# Patient Record
Sex: Female | Born: 1980
Health system: Southern US, Community
[De-identification: ages and names within clinical notes are randomized; demographics above are authoritative.]

## PROBLEM LIST (undated history)

## (undated) DIAGNOSIS — D649 Anemia, unspecified: Secondary | ICD-10-CM

## (undated) DIAGNOSIS — E119 Type 2 diabetes mellitus without complications: Secondary | ICD-10-CM

## (undated) DIAGNOSIS — I471 Supraventricular tachycardia, unspecified: Secondary | ICD-10-CM

## (undated) DIAGNOSIS — G8929 Other chronic pain: Secondary | ICD-10-CM

## (undated) DIAGNOSIS — R000 Tachycardia, unspecified: Secondary | ICD-10-CM

## (undated) DIAGNOSIS — R0789 Other chest pain: Secondary | ICD-10-CM

## (undated) DIAGNOSIS — E063 Autoimmune thyroiditis: Secondary | ICD-10-CM

## (undated) DIAGNOSIS — F419 Anxiety disorder, unspecified: Secondary | ICD-10-CM

## (undated) DIAGNOSIS — K760 Fatty (change of) liver, not elsewhere classified: Secondary | ICD-10-CM

## (undated) DIAGNOSIS — M1990 Unspecified osteoarthritis, unspecified site: Secondary | ICD-10-CM

## (undated) DIAGNOSIS — I10 Essential (primary) hypertension: Secondary | ICD-10-CM

## (undated) DIAGNOSIS — N760 Acute vaginitis: Secondary | ICD-10-CM

## (undated) DIAGNOSIS — E669 Obesity, unspecified: Secondary | ICD-10-CM

## (undated) DIAGNOSIS — K589 Irritable bowel syndrome without diarrhea: Secondary | ICD-10-CM

## (undated) DIAGNOSIS — R102 Pelvic and perineal pain: Secondary | ICD-10-CM

## (undated) DIAGNOSIS — B9689 Other specified bacterial agents as the cause of diseases classified elsewhere: Secondary | ICD-10-CM

## (undated) DIAGNOSIS — N3010 Interstitial cystitis (chronic) without hematuria: Secondary | ICD-10-CM

## (undated) DIAGNOSIS — G4733 Obstructive sleep apnea (adult) (pediatric): Secondary | ICD-10-CM

## (undated) HISTORY — DX: Other specified bacterial agents as the cause of diseases classified elsewhere: N76.0

## (undated) HISTORY — DX: Tachycardia, unspecified: R00.0

## (undated) HISTORY — DX: Other chronic pain: G89.29

## (undated) HISTORY — DX: Anemia, unspecified: D64.9

## (undated) HISTORY — PX: OTHER SURGICAL HISTORY: SHX169

## (undated) HISTORY — DX: Irritable bowel syndrome, unspecified: K58.9

## (undated) HISTORY — DX: Other chest pain: R07.89

## (undated) HISTORY — DX: Unspecified osteoarthritis, unspecified site: M19.90

## (undated) HISTORY — DX: Autoimmune thyroiditis: E06.3

## (undated) HISTORY — DX: Obesity, unspecified: E66.9

## (undated) HISTORY — DX: Anxiety disorder, unspecified: F41.9

## (undated) HISTORY — DX: Type 2 diabetes mellitus without complications: E11.9

## (undated) HISTORY — DX: Pelvic and perineal pain: R10.2

## (undated) HISTORY — PX: CYSTOSTOMY W/ BLADDER BIOPSY: SHX1431

## (undated) HISTORY — DX: Fatty (change of) liver, not elsewhere classified: K76.0

## (undated) HISTORY — DX: Obstructive sleep apnea (adult) (pediatric): G47.33

## (undated) HISTORY — DX: Acute vaginitis: B96.89

---
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
## 1999-06-11 ENCOUNTER — Emergency Department (HOSPITAL_COMMUNITY): Admission: EM | Admit: 1999-06-11 | Discharge: 1999-06-11 | Payer: Self-pay | Admitting: Emergency Medicine

## 1999-06-24 ENCOUNTER — Emergency Department (HOSPITAL_COMMUNITY): Admission: EM | Admit: 1999-06-24 | Discharge: 1999-06-24 | Payer: Self-pay | Admitting: Emergency Medicine

## 1999-06-25 ENCOUNTER — Emergency Department (HOSPITAL_COMMUNITY): Admission: EM | Admit: 1999-06-25 | Discharge: 1999-06-25 | Payer: Self-pay | Admitting: Emergency Medicine

## 1999-07-17 ENCOUNTER — Inpatient Hospital Stay (HOSPITAL_COMMUNITY): Admission: AD | Admit: 1999-07-17 | Discharge: 1999-07-17 | Payer: Self-pay | Admitting: Obstetrics & Gynecology

## 1999-07-18 ENCOUNTER — Ambulatory Visit (HOSPITAL_COMMUNITY): Admission: RE | Admit: 1999-07-18 | Discharge: 1999-07-18 | Payer: Self-pay | Admitting: *Deleted

## 1999-08-28 ENCOUNTER — Inpatient Hospital Stay (HOSPITAL_COMMUNITY): Admission: AD | Admit: 1999-08-28 | Discharge: 1999-08-28 | Payer: Self-pay | Admitting: Obstetrics & Gynecology

## 1999-09-25 HISTORY — PX: TUBAL LIGATION: SHX77

## 2000-01-07 ENCOUNTER — Inpatient Hospital Stay (HOSPITAL_COMMUNITY): Admission: AD | Admit: 2000-01-07 | Discharge: 2000-01-07 | Payer: Self-pay | Admitting: *Deleted

## 2000-01-20 ENCOUNTER — Inpatient Hospital Stay (HOSPITAL_COMMUNITY): Admission: AD | Admit: 2000-01-20 | Discharge: 2000-01-20 | Payer: Self-pay | Admitting: Gynecology

## 2000-02-04 ENCOUNTER — Inpatient Hospital Stay (HOSPITAL_COMMUNITY): Admission: AD | Admit: 2000-02-04 | Discharge: 2000-02-06 | Payer: Self-pay | Admitting: Gynecology

## 2000-03-11 ENCOUNTER — Other Ambulatory Visit: Admission: RE | Admit: 2000-03-11 | Discharge: 2000-03-11 | Payer: Self-pay | Admitting: Gynecology

## 2000-03-21 ENCOUNTER — Other Ambulatory Visit: Admission: RE | Admit: 2000-03-21 | Discharge: 2000-03-21 | Payer: Self-pay | Admitting: Gynecology

## 2000-03-21 ENCOUNTER — Encounter (INDEPENDENT_AMBULATORY_CARE_PROVIDER_SITE_OTHER): Payer: Self-pay

## 2000-06-14 ENCOUNTER — Encounter: Payer: Self-pay | Admitting: Gynecology

## 2000-06-14 ENCOUNTER — Inpatient Hospital Stay (HOSPITAL_COMMUNITY): Admission: AD | Admit: 2000-06-14 | Discharge: 2000-06-14 | Payer: Self-pay | Admitting: Gynecology

## 2000-09-27 ENCOUNTER — Emergency Department (HOSPITAL_COMMUNITY): Admission: EM | Admit: 2000-09-27 | Discharge: 2000-09-27 | Payer: Self-pay | Admitting: Emergency Medicine

## 2000-11-07 ENCOUNTER — Encounter: Payer: Self-pay | Admitting: Emergency Medicine

## 2000-11-07 ENCOUNTER — Emergency Department (HOSPITAL_COMMUNITY): Admission: EM | Admit: 2000-11-07 | Discharge: 2000-11-07 | Payer: Self-pay | Admitting: Emergency Medicine

## 2001-03-25 ENCOUNTER — Other Ambulatory Visit: Admission: RE | Admit: 2001-03-25 | Discharge: 2001-03-25 | Payer: Self-pay | Admitting: Gynecology

## 2001-04-12 ENCOUNTER — Inpatient Hospital Stay (HOSPITAL_COMMUNITY): Admission: AD | Admit: 2001-04-12 | Discharge: 2001-04-12 | Payer: Self-pay | Admitting: *Deleted

## 2001-06-23 ENCOUNTER — Encounter (INDEPENDENT_AMBULATORY_CARE_PROVIDER_SITE_OTHER): Payer: Self-pay

## 2001-06-23 ENCOUNTER — Encounter (INDEPENDENT_AMBULATORY_CARE_PROVIDER_SITE_OTHER): Payer: Self-pay | Admitting: Specialist

## 2001-06-23 ENCOUNTER — Ambulatory Visit (HOSPITAL_COMMUNITY): Admission: RE | Admit: 2001-06-23 | Discharge: 2001-06-23 | Payer: Self-pay | Admitting: Gynecology

## 2001-08-11 ENCOUNTER — Other Ambulatory Visit: Admission: RE | Admit: 2001-08-11 | Discharge: 2001-08-11 | Payer: Self-pay | Admitting: *Deleted

## 2001-08-27 ENCOUNTER — Encounter: Admission: RE | Admit: 2001-08-27 | Discharge: 2001-10-07 | Payer: Self-pay | Admitting: *Deleted

## 2001-10-13 ENCOUNTER — Encounter (INDEPENDENT_AMBULATORY_CARE_PROVIDER_SITE_OTHER): Payer: Self-pay | Admitting: Specialist

## 2001-10-13 ENCOUNTER — Encounter: Payer: Self-pay | Admitting: Internal Medicine

## 2001-10-13 ENCOUNTER — Ambulatory Visit (HOSPITAL_COMMUNITY): Admission: RE | Admit: 2001-10-13 | Discharge: 2001-10-13 | Payer: Self-pay | Admitting: *Deleted

## 2001-12-26 ENCOUNTER — Other Ambulatory Visit: Admission: RE | Admit: 2001-12-26 | Discharge: 2001-12-26 | Payer: Self-pay | Admitting: *Deleted

## 2002-01-01 ENCOUNTER — Encounter: Payer: Self-pay | Admitting: *Deleted

## 2002-01-01 ENCOUNTER — Encounter: Admission: RE | Admit: 2002-01-01 | Discharge: 2002-01-01 | Payer: Self-pay | Admitting: *Deleted

## 2002-04-02 ENCOUNTER — Inpatient Hospital Stay (HOSPITAL_COMMUNITY): Admission: AD | Admit: 2002-04-02 | Discharge: 2002-04-02 | Payer: Self-pay | Admitting: Gynecology

## 2002-07-05 ENCOUNTER — Inpatient Hospital Stay (HOSPITAL_COMMUNITY): Admission: AD | Admit: 2002-07-05 | Discharge: 2002-07-05 | Payer: Self-pay | Admitting: Gynecology

## 2002-09-24 HISTORY — PX: TOTAL ABDOMINAL HYSTERECTOMY: SHX209

## 2002-10-14 ENCOUNTER — Other Ambulatory Visit: Admission: RE | Admit: 2002-10-14 | Discharge: 2002-10-14 | Payer: Self-pay | Admitting: Gynecology

## 2002-11-15 ENCOUNTER — Inpatient Hospital Stay (HOSPITAL_COMMUNITY): Admission: AD | Admit: 2002-11-15 | Discharge: 2002-11-15 | Payer: Self-pay | Admitting: Obstetrics

## 2002-11-15 ENCOUNTER — Encounter: Payer: Self-pay | Admitting: Obstetrics

## 2002-12-24 ENCOUNTER — Inpatient Hospital Stay (HOSPITAL_COMMUNITY): Admission: AD | Admit: 2002-12-24 | Discharge: 2002-12-24 | Payer: Self-pay | Admitting: Obstetrics & Gynecology

## 2003-01-08 ENCOUNTER — Inpatient Hospital Stay (HOSPITAL_COMMUNITY): Admission: AD | Admit: 2003-01-08 | Discharge: 2003-01-08 | Payer: Self-pay | Admitting: Obstetrics & Gynecology

## 2003-01-08 ENCOUNTER — Encounter: Payer: Self-pay | Admitting: Obstetrics & Gynecology

## 2003-01-08 ENCOUNTER — Ambulatory Visit (HOSPITAL_COMMUNITY): Admission: RE | Admit: 2003-01-08 | Discharge: 2003-01-08 | Payer: Self-pay | Admitting: Obstetrics & Gynecology

## 2003-01-31 ENCOUNTER — Inpatient Hospital Stay (HOSPITAL_COMMUNITY): Admission: AD | Admit: 2003-01-31 | Discharge: 2003-01-31 | Payer: Self-pay | Admitting: Obstetrics & Gynecology

## 2003-02-05 ENCOUNTER — Ambulatory Visit (HOSPITAL_COMMUNITY): Admission: RE | Admit: 2003-02-05 | Discharge: 2003-02-05 | Payer: Self-pay | Admitting: Obstetrics & Gynecology

## 2003-02-05 ENCOUNTER — Encounter: Payer: Self-pay | Admitting: Obstetrics & Gynecology

## 2003-02-24 ENCOUNTER — Inpatient Hospital Stay (HOSPITAL_COMMUNITY): Admission: AD | Admit: 2003-02-24 | Discharge: 2003-02-24 | Payer: Self-pay | Admitting: Obstetrics

## 2003-03-09 ENCOUNTER — Inpatient Hospital Stay (HOSPITAL_COMMUNITY): Admission: AD | Admit: 2003-03-09 | Discharge: 2003-03-09 | Payer: Self-pay | Admitting: Obstetrics & Gynecology

## 2003-04-10 ENCOUNTER — Inpatient Hospital Stay (HOSPITAL_COMMUNITY): Admission: AD | Admit: 2003-04-10 | Discharge: 2003-04-10 | Payer: Self-pay | Admitting: Obstetrics & Gynecology

## 2003-04-27 ENCOUNTER — Encounter: Payer: Self-pay | Admitting: Obstetrics & Gynecology

## 2003-04-27 ENCOUNTER — Ambulatory Visit (HOSPITAL_COMMUNITY): Admission: RE | Admit: 2003-04-27 | Discharge: 2003-04-27 | Payer: Self-pay | Admitting: Obstetrics & Gynecology

## 2003-06-01 ENCOUNTER — Encounter: Payer: Self-pay | Admitting: Obstetrics & Gynecology

## 2003-06-01 ENCOUNTER — Inpatient Hospital Stay (HOSPITAL_COMMUNITY): Admission: AD | Admit: 2003-06-01 | Discharge: 2003-06-01 | Payer: Self-pay | Admitting: Obstetrics & Gynecology

## 2003-06-04 ENCOUNTER — Inpatient Hospital Stay (HOSPITAL_COMMUNITY): Admission: AD | Admit: 2003-06-04 | Discharge: 2003-06-04 | Payer: Self-pay | Admitting: Obstetrics & Gynecology

## 2003-06-08 ENCOUNTER — Ambulatory Visit (HOSPITAL_COMMUNITY): Admission: RE | Admit: 2003-06-08 | Discharge: 2003-06-08 | Payer: Self-pay | Admitting: Obstetrics & Gynecology

## 2003-06-08 ENCOUNTER — Encounter: Payer: Self-pay | Admitting: Obstetrics & Gynecology

## 2003-06-14 ENCOUNTER — Inpatient Hospital Stay (HOSPITAL_COMMUNITY): Admission: AD | Admit: 2003-06-14 | Discharge: 2003-06-14 | Payer: Self-pay | Admitting: Obstetrics

## 2003-06-17 ENCOUNTER — Inpatient Hospital Stay (HOSPITAL_COMMUNITY): Admission: AD | Admit: 2003-06-17 | Discharge: 2003-06-17 | Payer: Self-pay | Admitting: Obstetrics

## 2003-06-17 ENCOUNTER — Encounter: Payer: Self-pay | Admitting: Obstetrics

## 2003-06-18 ENCOUNTER — Ambulatory Visit (HOSPITAL_COMMUNITY): Admission: RE | Admit: 2003-06-18 | Discharge: 2003-06-18 | Payer: Self-pay | Admitting: Obstetrics

## 2003-06-18 ENCOUNTER — Encounter: Payer: Self-pay | Admitting: Obstetrics

## 2003-06-25 ENCOUNTER — Inpatient Hospital Stay (HOSPITAL_COMMUNITY): Admission: AD | Admit: 2003-06-25 | Discharge: 2003-06-28 | Payer: Self-pay | Admitting: Obstetrics

## 2003-06-26 ENCOUNTER — Encounter (INDEPENDENT_AMBULATORY_CARE_PROVIDER_SITE_OTHER): Payer: Self-pay | Admitting: Specialist

## 2003-10-07 ENCOUNTER — Emergency Department (HOSPITAL_COMMUNITY): Admission: EM | Admit: 2003-10-07 | Discharge: 2003-10-07 | Payer: Self-pay | Admitting: Emergency Medicine

## 2003-10-29 ENCOUNTER — Ambulatory Visit (HOSPITAL_COMMUNITY): Admission: RE | Admit: 2003-10-29 | Discharge: 2003-10-29 | Payer: Self-pay | Admitting: Obstetrics & Gynecology

## 2003-12-02 ENCOUNTER — Ambulatory Visit (HOSPITAL_COMMUNITY): Admission: RE | Admit: 2003-12-02 | Discharge: 2003-12-02 | Payer: Self-pay | Admitting: Obstetrics & Gynecology

## 2004-01-24 ENCOUNTER — Inpatient Hospital Stay (HOSPITAL_COMMUNITY): Admission: AD | Admit: 2004-01-24 | Discharge: 2004-01-24 | Payer: Self-pay | Admitting: Maternal and Fetal Medicine

## 2004-05-22 ENCOUNTER — Encounter: Admission: RE | Admit: 2004-05-22 | Discharge: 2004-07-20 | Payer: Self-pay | Admitting: Obstetrics & Gynecology

## 2004-09-28 ENCOUNTER — Other Ambulatory Visit: Admission: RE | Admit: 2004-09-28 | Discharge: 2004-09-28 | Payer: Self-pay | Admitting: Family Medicine

## 2004-10-30 ENCOUNTER — Ambulatory Visit (HOSPITAL_COMMUNITY): Admission: RE | Admit: 2004-10-30 | Discharge: 2004-10-30 | Payer: Self-pay | Admitting: Obstetrics and Gynecology

## 2004-11-30 ENCOUNTER — Ambulatory Visit: Payer: Self-pay | Admitting: "Endocrinology

## 2004-12-07 ENCOUNTER — Encounter: Admission: RE | Admit: 2004-12-07 | Discharge: 2004-12-07 | Payer: Self-pay | Admitting: Obstetrics and Gynecology

## 2005-03-19 ENCOUNTER — Emergency Department (HOSPITAL_COMMUNITY): Admission: EM | Admit: 2005-03-19 | Discharge: 2005-03-19 | Payer: Self-pay | Admitting: Emergency Medicine

## 2005-03-26 ENCOUNTER — Ambulatory Visit: Payer: Self-pay | Admitting: "Endocrinology

## 2005-08-07 ENCOUNTER — Ambulatory Visit: Payer: Self-pay | Admitting: "Endocrinology

## 2005-08-15 ENCOUNTER — Encounter: Admission: RE | Admit: 2005-08-15 | Discharge: 2005-08-15 | Payer: Self-pay | Admitting: Gastroenterology

## 2005-08-15 ENCOUNTER — Encounter (INDEPENDENT_AMBULATORY_CARE_PROVIDER_SITE_OTHER): Payer: Self-pay | Admitting: *Deleted

## 2005-09-04 ENCOUNTER — Inpatient Hospital Stay (HOSPITAL_COMMUNITY): Admission: AD | Admit: 2005-09-04 | Discharge: 2005-09-04 | Payer: Self-pay | Admitting: *Deleted

## 2005-11-06 ENCOUNTER — Other Ambulatory Visit: Admission: RE | Admit: 2005-11-06 | Discharge: 2005-11-06 | Payer: Self-pay | Admitting: Obstetrics and Gynecology

## 2005-11-14 ENCOUNTER — Ambulatory Visit: Payer: Self-pay | Admitting: "Endocrinology

## 2006-01-14 ENCOUNTER — Ambulatory Visit: Payer: Self-pay | Admitting: "Endocrinology

## 2006-04-08 ENCOUNTER — Ambulatory Visit: Payer: Self-pay | Admitting: "Endocrinology

## 2006-05-13 ENCOUNTER — Encounter: Admission: RE | Admit: 2006-05-13 | Discharge: 2006-06-11 | Payer: Self-pay | Admitting: Family Medicine

## 2006-08-18 ENCOUNTER — Emergency Department (HOSPITAL_COMMUNITY): Admission: EM | Admit: 2006-08-18 | Discharge: 2006-08-18 | Payer: Self-pay | Admitting: Family Medicine

## 2006-08-20 ENCOUNTER — Ambulatory Visit: Payer: Self-pay | Admitting: "Endocrinology

## 2006-09-24 HISTORY — PX: WRIST SURGERY: SHX841

## 2006-09-24 HISTORY — PX: SHOULDER SURGERY: SHX246

## 2006-12-04 ENCOUNTER — Ambulatory Visit (HOSPITAL_COMMUNITY): Admission: RE | Admit: 2006-12-04 | Discharge: 2006-12-04 | Payer: Self-pay | Admitting: Obstetrics and Gynecology

## 2006-12-04 ENCOUNTER — Encounter (INDEPENDENT_AMBULATORY_CARE_PROVIDER_SITE_OTHER): Payer: Self-pay | Admitting: *Deleted

## 2006-12-10 ENCOUNTER — Encounter (INDEPENDENT_AMBULATORY_CARE_PROVIDER_SITE_OTHER): Payer: Self-pay | Admitting: *Deleted

## 2006-12-10 ENCOUNTER — Inpatient Hospital Stay (HOSPITAL_COMMUNITY): Admission: RE | Admit: 2006-12-10 | Discharge: 2006-12-12 | Payer: Self-pay | Admitting: Obstetrics and Gynecology

## 2007-05-03 ENCOUNTER — Emergency Department (HOSPITAL_COMMUNITY): Admission: EM | Admit: 2007-05-03 | Discharge: 2007-05-03 | Payer: Self-pay | Admitting: Emergency Medicine

## 2007-06-24 ENCOUNTER — Emergency Department (HOSPITAL_COMMUNITY): Admission: EM | Admit: 2007-06-24 | Discharge: 2007-06-24 | Payer: Self-pay | Admitting: Emergency Medicine

## 2008-02-24 ENCOUNTER — Emergency Department (HOSPITAL_COMMUNITY): Admission: EM | Admit: 2008-02-24 | Discharge: 2008-02-24 | Payer: Self-pay | Admitting: Emergency Medicine

## 2008-03-13 ENCOUNTER — Emergency Department (HOSPITAL_COMMUNITY): Admission: EM | Admit: 2008-03-13 | Discharge: 2008-03-13 | Payer: Self-pay | Admitting: Emergency Medicine

## 2008-04-14 ENCOUNTER — Ambulatory Visit: Payer: Self-pay | Admitting: "Endocrinology

## 2008-06-23 ENCOUNTER — Inpatient Hospital Stay (HOSPITAL_COMMUNITY): Admission: AD | Admit: 2008-06-23 | Discharge: 2008-06-24 | Payer: Self-pay | Admitting: Obstetrics and Gynecology

## 2008-07-08 ENCOUNTER — Ambulatory Visit: Payer: Self-pay | Admitting: Internal Medicine

## 2008-07-08 DIAGNOSIS — D500 Iron deficiency anemia secondary to blood loss (chronic): Secondary | ICD-10-CM | POA: Insufficient documentation

## 2008-07-08 DIAGNOSIS — N809 Endometriosis, unspecified: Secondary | ICD-10-CM | POA: Insufficient documentation

## 2008-07-12 ENCOUNTER — Telehealth: Payer: Self-pay | Admitting: Internal Medicine

## 2008-07-13 ENCOUNTER — Telehealth: Payer: Self-pay | Admitting: Internal Medicine

## 2008-07-13 ENCOUNTER — Ambulatory Visit: Payer: Self-pay | Admitting: Internal Medicine

## 2008-07-13 LAB — CONVERTED CEMR LAB
ALT: 17 units/L (ref 0–35)
AST: 20 units/L (ref 0–37)
Albumin: 3.6 g/dL (ref 3.5–5.2)
Alkaline Phosphatase: 37 units/L — ABNORMAL LOW (ref 39–117)
BUN: 12 mg/dL (ref 6–23)
Basophils Absolute: 0 10*3/uL (ref 0.0–0.1)
Basophils Relative: 0.5 % (ref 0.0–3.0)
Bilirubin Urine: NEGATIVE
Bilirubin, Direct: 0.1 mg/dL (ref 0.0–0.3)
CO2: 26 meq/L (ref 19–32)
Calcium: 9.3 mg/dL (ref 8.4–10.5)
Chloride: 108 meq/L (ref 96–112)
Cholesterol: 103 mg/dL (ref 0–200)
Creatinine, Ser: 0.8 mg/dL (ref 0.4–1.2)
Eosinophils Absolute: 0.1 10*3/uL (ref 0.0–0.7)
Eosinophils Relative: 1.7 % (ref 0.0–5.0)
GFR calc Af Amer: 110 mL/min
GFR calc non Af Amer: 91 mL/min
Glucose, Bld: 115 mg/dL — ABNORMAL HIGH (ref 70–99)
HCT: 34.6 % — ABNORMAL LOW (ref 36.0–46.0)
HDL: 26.9 mg/dL — ABNORMAL LOW (ref >39.0)
Hemoglobin, Urine: NEGATIVE
Hemoglobin: 12.3 g/dL (ref 12.0–15.0)
Hgb A1c MFr Bld: 6.2 % — ABNORMAL HIGH (ref 4.6–6.0)
Ketones, ur: NEGATIVE mg/dL
LDL Cholesterol: 60 mg/dL (ref 0–99)
Leukocytes, UA: NEGATIVE
Lymphocytes Relative: 41.2 % (ref 12.0–46.0)
MCHC: 35.5 g/dL (ref 30.0–36.0)
MCV: 88.9 fL (ref 78.0–100.0)
Monocytes Absolute: 0.4 10*3/uL (ref 0.1–1.0)
Monocytes Relative: 7.5 % (ref 3.0–12.0)
Neutro Abs: 2.3 10*3/uL (ref 1.4–7.7)
Neutrophils Relative %: 49.1 % (ref 43.0–77.0)
Nitrite: NEGATIVE
Platelets: 189 10*3/uL (ref 150–400)
Potassium: 4.2 meq/L (ref 3.5–5.1)
RBC: 3.89 M/uL (ref 3.87–5.11)
RDW: 12.9 % (ref 11.5–14.6)
Sodium: 141 meq/L (ref 135–145)
Specific Gravity, Urine: >=1.03 (ref 1.000–1.03)
TSH: 0.86 microintl units/mL (ref 0.35–5.50)
Total Bilirubin: 0.4 mg/dL (ref 0.3–1.2)
Total CHOL/HDL Ratio: 3.8
Total Protein, Urine: NEGATIVE mg/dL
Total Protein: 7.3 g/dL (ref 6.0–8.3)
Triglycerides: 79 mg/dL (ref 0–149)
Urine Glucose: NEGATIVE mg/dL
Urobilinogen, UA: 0.2 (ref 0.0–1.0)
VLDL: 16 mg/dL (ref 0–40)
WBC: 4.7 10*3/uL (ref 4.5–10.5)
pH: 5 (ref 5.0–8.0)

## 2008-07-14 ENCOUNTER — Encounter: Payer: Self-pay | Admitting: Internal Medicine

## 2008-07-14 ENCOUNTER — Ambulatory Visit: Payer: Self-pay | Admitting: Cardiology

## 2008-07-20 ENCOUNTER — Ambulatory Visit: Payer: Self-pay | Admitting: Internal Medicine

## 2008-07-20 DIAGNOSIS — K7689 Other specified diseases of liver: Secondary | ICD-10-CM | POA: Insufficient documentation

## 2008-08-01 ENCOUNTER — Emergency Department (HOSPITAL_COMMUNITY): Admission: EM | Admit: 2008-08-01 | Discharge: 2008-08-01 | Payer: Self-pay | Admitting: Emergency Medicine

## 2008-11-02 ENCOUNTER — Telehealth: Payer: Self-pay | Admitting: Family Medicine

## 2008-11-03 ENCOUNTER — Telehealth (INDEPENDENT_AMBULATORY_CARE_PROVIDER_SITE_OTHER): Payer: Self-pay | Admitting: *Deleted

## 2008-11-03 ENCOUNTER — Ambulatory Visit: Payer: Self-pay | Admitting: Endocrinology

## 2008-11-03 ENCOUNTER — Telehealth: Payer: Self-pay | Admitting: Endocrinology

## 2008-11-03 LAB — CONVERTED CEMR LAB
BUN: 10 mg/dL (ref 6–23)
Chloride: 110 meq/L (ref 96–112)
Creatinine, Ser: 0.7 mg/dL (ref 0.4–1.2)
Glucose, Bld: 96 mg/dL (ref 70–99)
Potassium: 4.4 meq/L (ref 3.5–5.1)

## 2008-11-04 ENCOUNTER — Encounter: Payer: Self-pay | Admitting: Endocrinology

## 2008-11-04 ENCOUNTER — Ambulatory Visit: Payer: Self-pay

## 2008-11-04 LAB — CONVERTED CEMR LAB
CO2: 29 meq/L (ref 19–32)
Chloride: 109 meq/L (ref 96–112)
Glucose, Bld: 87 mg/dL (ref 70–99)
Potassium: 3.9 meq/L (ref 3.5–5.1)
Sodium: 142 meq/L (ref 135–145)

## 2008-11-05 ENCOUNTER — Telehealth (INDEPENDENT_AMBULATORY_CARE_PROVIDER_SITE_OTHER): Payer: Self-pay | Admitting: *Deleted

## 2008-11-05 ENCOUNTER — Ambulatory Visit: Payer: Self-pay | Admitting: Internal Medicine

## 2008-12-19 ENCOUNTER — Emergency Department (HOSPITAL_COMMUNITY): Admission: EM | Admit: 2008-12-19 | Discharge: 2008-12-19 | Payer: Self-pay | Admitting: Emergency Medicine

## 2008-12-27 ENCOUNTER — Ambulatory Visit: Payer: Self-pay | Admitting: Internal Medicine

## 2008-12-27 DIAGNOSIS — E669 Obesity, unspecified: Secondary | ICD-10-CM | POA: Insufficient documentation

## 2009-03-11 ENCOUNTER — Encounter: Payer: Self-pay | Admitting: Internal Medicine

## 2009-03-11 ENCOUNTER — Ambulatory Visit: Payer: Self-pay | Admitting: Internal Medicine

## 2009-03-11 ENCOUNTER — Other Ambulatory Visit: Admission: RE | Admit: 2009-03-11 | Discharge: 2009-03-11 | Payer: Self-pay | Admitting: Internal Medicine

## 2009-03-11 DIAGNOSIS — N6019 Diffuse cystic mastopathy of unspecified breast: Secondary | ICD-10-CM | POA: Insufficient documentation

## 2009-03-11 LAB — CONVERTED CEMR LAB
ALT: 19 units/L (ref 0–35)
BUN: 12 mg/dL (ref 6–23)
Bilirubin, Direct: 0.1 mg/dL (ref 0.0–0.3)
Calcium: 9.4 mg/dL (ref 8.4–10.5)
Chloride: 108 meq/L (ref 96–112)
Cholesterol: 103 mg/dL (ref 0–200)
Creatinine, Ser: 0.8 mg/dL (ref 0.4–1.2)
Eosinophils Relative: 1.4 % (ref 0.0–5.0)
GFR calc non Af Amer: 108.96 mL/min (ref >60)
HDL: 34.2 mg/dL — ABNORMAL LOW (ref >39.00)
Hgb A1c MFr Bld: 6.3 % (ref 4.6–6.5)
Ketones, ur: NEGATIVE mg/dL
LDL Cholesterol: 56 mg/dL (ref 0–99)
Leukocytes, UA: NEGATIVE
MCV: 89.3 fL (ref 78.0–100.0)
Monocytes Absolute: 0.3 10*3/uL (ref 0.1–1.0)
Neutrophils Relative %: 43.3 % (ref 43.0–77.0)
Nitrite: NEGATIVE
Platelets: 197 10*3/uL (ref 150.0–400.0)
Specific Gravity, Urine: 1.02 (ref 1.000–1.030)
Total Bilirubin: 0.7 mg/dL (ref 0.3–1.2)
Triglycerides: 64 mg/dL (ref 0.0–149.0)
VLDL: 12.8 mg/dL (ref 0.0–40.0)
WBC: 4.4 10*3/uL — ABNORMAL LOW (ref 4.5–10.5)
pH: 5.5 (ref 5.0–8.0)

## 2009-03-15 ENCOUNTER — Encounter (INDEPENDENT_AMBULATORY_CARE_PROVIDER_SITE_OTHER): Payer: Self-pay | Admitting: *Deleted

## 2009-03-15 ENCOUNTER — Encounter: Admission: RE | Admit: 2009-03-15 | Discharge: 2009-03-15 | Payer: Self-pay | Admitting: Internal Medicine

## 2009-03-16 ENCOUNTER — Encounter: Payer: Self-pay | Admitting: Internal Medicine

## 2009-03-18 ENCOUNTER — Telehealth: Payer: Self-pay | Admitting: Internal Medicine

## 2009-03-21 ENCOUNTER — Encounter: Payer: Self-pay | Admitting: Internal Medicine

## 2009-04-05 DIAGNOSIS — E039 Hypothyroidism, unspecified: Secondary | ICD-10-CM | POA: Insufficient documentation

## 2009-05-17 DIAGNOSIS — F411 Generalized anxiety disorder: Secondary | ICD-10-CM | POA: Insufficient documentation

## 2009-06-17 ENCOUNTER — Telehealth: Payer: Self-pay | Admitting: Internal Medicine

## 2009-07-10 ENCOUNTER — Inpatient Hospital Stay (HOSPITAL_COMMUNITY): Admission: AD | Admit: 2009-07-10 | Discharge: 2009-07-10 | Payer: Self-pay | Admitting: Obstetrics & Gynecology

## 2009-07-10 ENCOUNTER — Telehealth: Payer: Self-pay | Admitting: Internal Medicine

## 2009-08-23 ENCOUNTER — Emergency Department (HOSPITAL_COMMUNITY): Admission: EM | Admit: 2009-08-23 | Discharge: 2009-08-23 | Payer: Self-pay | Admitting: Emergency Medicine

## 2010-02-16 ENCOUNTER — Ambulatory Visit: Payer: Self-pay | Admitting: Internal Medicine

## 2010-02-16 DIAGNOSIS — E063 Autoimmune thyroiditis: Secondary | ICD-10-CM | POA: Insufficient documentation

## 2010-02-16 DIAGNOSIS — F909 Attention-deficit hyperactivity disorder, unspecified type: Secondary | ICD-10-CM | POA: Insufficient documentation

## 2010-02-16 LAB — CONVERTED CEMR LAB
AST: 20 units/L (ref 0–37)
BUN: 13 mg/dL (ref 6–23)
Basophils Absolute: 0 10*3/uL (ref 0.0–0.1)
Calcium: 9.4 mg/dL (ref 8.4–10.5)
Cholesterol: 111 mg/dL (ref 0–200)
Eosinophils Absolute: 0.1 10*3/uL (ref 0.0–0.7)
GFR calc non Af Amer: 109.84 mL/min (ref >60)
Glucose, Bld: 83 mg/dL (ref 70–99)
HCT: 35.7 % — ABNORMAL LOW (ref 36.0–46.0)
HDL: 35.5 mg/dL — ABNORMAL LOW (ref >39.00)
Lymphocytes Relative: 36.5 % (ref 12.0–46.0)
Lymphs Abs: 2.2 10*3/uL (ref 0.7–4.0)
MCHC: 34.4 g/dL (ref 30.0–36.0)
Monocytes Relative: 10 % (ref 3.0–12.0)
Platelets: 206 10*3/uL (ref 150.0–400.0)
RDW: 13.4 % (ref 11.5–14.6)
Saturation Ratios: 16.6 % — ABNORMAL LOW (ref 20.0–50.0)
TSH: 0.76 microintl units/mL (ref 0.35–5.50)
Total Bilirubin: 0.3 mg/dL (ref 0.3–1.2)
Triglycerides: 100 mg/dL (ref 0.0–149.0)
VLDL: 20 mg/dL (ref 0.0–40.0)

## 2010-02-22 ENCOUNTER — Encounter (INDEPENDENT_AMBULATORY_CARE_PROVIDER_SITE_OTHER): Payer: Self-pay | Admitting: *Deleted

## 2010-03-06 ENCOUNTER — Ambulatory Visit: Payer: Self-pay | Admitting: Licensed Clinical Social Worker

## 2010-03-23 ENCOUNTER — Ambulatory Visit: Payer: Self-pay | Admitting: Internal Medicine

## 2010-04-14 ENCOUNTER — Telehealth: Payer: Self-pay | Admitting: Internal Medicine

## 2010-04-18 ENCOUNTER — Ambulatory Visit: Payer: Self-pay | Admitting: Licensed Clinical Social Worker

## 2010-04-19 ENCOUNTER — Encounter (INDEPENDENT_AMBULATORY_CARE_PROVIDER_SITE_OTHER): Payer: Self-pay | Admitting: *Deleted

## 2010-04-28 ENCOUNTER — Ambulatory Visit: Payer: Self-pay | Admitting: Licensed Clinical Social Worker

## 2010-05-05 ENCOUNTER — Ambulatory Visit: Payer: Self-pay | Admitting: Licensed Clinical Social Worker

## 2010-07-17 ENCOUNTER — Ambulatory Visit: Payer: Self-pay | Admitting: Internal Medicine

## 2010-07-17 DIAGNOSIS — R519 Headache, unspecified: Secondary | ICD-10-CM | POA: Insufficient documentation

## 2010-07-17 DIAGNOSIS — R51 Headache: Secondary | ICD-10-CM | POA: Insufficient documentation

## 2010-07-17 LAB — CONVERTED CEMR LAB
ALT: 25 units/L (ref 0–35)
AST: 23 units/L (ref 0–37)
Albumin: 3.9 g/dL (ref 3.5–5.2)
BUN: 13 mg/dL (ref 6–23)
Basophils Relative: 0.8 % (ref 0.0–3.0)
CO2: 24 meq/L (ref 19–32)
Chloride: 104 meq/L (ref 96–112)
Creatinine, Ser: 0.9 mg/dL (ref 0.4–1.2)
Eosinophils Absolute: 0.1 10*3/uL (ref 0.0–0.7)
Eosinophils Relative: 1.3 % (ref 0.0–5.0)
Glucose, Bld: 87 mg/dL (ref 70–99)
Hemoglobin: 12.8 g/dL (ref 12.0–15.0)
Hgb A1c MFr Bld: 6.5 % (ref 4.6–6.5)
Lymphocytes Relative: 43.8 % (ref 12.0–46.0)
MCHC: 33.9 g/dL (ref 30.0–36.0)
Monocytes Relative: 7.1 % (ref 3.0–12.0)
Neutro Abs: 2.6 10*3/uL (ref 1.4–7.7)
Neutrophils Relative %: 47 % (ref 43.0–77.0)
Potassium: 4.3 meq/L (ref 3.5–5.1)
RBC: 4.17 M/uL (ref 3.87–5.11)
Saturation Ratios: 17.3 % — ABNORMAL LOW (ref 20.0–50.0)
TSH: 1 microintl units/mL (ref 0.35–5.50)
Total Protein: 7.3 g/dL (ref 6.0–8.3)
WBC: 5.4 10*3/uL (ref 4.5–10.5)

## 2010-07-18 ENCOUNTER — Telehealth: Payer: Self-pay | Admitting: Internal Medicine

## 2010-07-22 ENCOUNTER — Encounter: Admission: RE | Admit: 2010-07-22 | Discharge: 2010-07-22 | Payer: Self-pay | Admitting: Internal Medicine

## 2010-07-23 ENCOUNTER — Encounter: Payer: Self-pay | Admitting: Internal Medicine

## 2010-10-16 ENCOUNTER — Encounter: Payer: Self-pay | Admitting: Internal Medicine

## 2010-10-19 ENCOUNTER — Telehealth: Payer: Self-pay | Admitting: Internal Medicine

## 2010-10-24 NOTE — Progress Notes (Signed)
Summary: Lab results  Phone Note Call from Patient Call back at Home Phone [phone]   Caller: Patient/[phone] Call For: Etta Grandchild MD Reason for Call: Lab or Test Results Summary of Call: Pt would like a call need lab results from 07-17-2010 Initial call taken by: Shelbie Proctor,  July 18, 2010 11:19 AM  Follow-up for Phone Call        her labs look great Follow-up by: Etta Grandchild MD,  July 19, 2010 7:36 AM  Additional Follow-up for Phone Call Additional follow up Details #1::        Called pt and recieved VM/ will try again later.Alvy Beal Archie CMA  July 19, 2010 10:54 AM   Patient notified per MD..Marland KitchenAlvy Beal Archie CMA  July 19, 2010 2:13 PM
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
## 2010-10-24 NOTE — Letter (Signed)
Summary: [facility] Consult Scheduled Letter  [facility]  [address] [place], [place] [address]   Phone: [phone]  Fax: [phone]      02/22/2010 MRN: [id number]  [name] [address] [place], [place]  [address]    Dear Lisa Cole,      We have scheduled an appointment for you.  At the recommendation of Dr.Jones, we have scheduled you a consult with Dr Nelle Don on 03/21/10 at 10:00am.  Their phone number is [phone].  If this appointment day and time is not convenient for you, please feel free to call the office of the doctor you are being referred to at the number listed above and reschedule the appointment.     [facility] [address] [place], [place] [address]    Thank you,  Patient Care Coordinator [facility]

## 2010-10-24 NOTE — Progress Notes (Signed)
Summary: lab results  ---- Converted from flag ---- ---- 04/14/2010 4:09 PM, Verdell Face wrote:  Lisa Cole / [id number]  Pt states she never got a call or letter w/results from [facility] in May or June. Can you let her know results please, [phone].  Elnita Maxwell ------------------------------  Phone Note Outgoing Call   Summary of Call: Please advise, pt request lab results from prior visit Thanks.Marland KitchenMarland KitchenAlvy Beal Archie CMA  April 14, 2010 4:13 PM   Follow-up for Phone Call        they all look great! Follow-up by: Etta Grandchild MD,  April 14, 2010 4:15 PM  Additional Follow-up for Phone Call Additional follow up Details #1::        Spoke with pt and advised per MD. Patient also asked if there are any vision providers associated with [facility] since she has been approved 100% by [employer] for sevices. After checking with other, pt advised non associcated.Alvy Beal Archie CMA  April 17, 2010 9:23 AM

## 2010-10-24 NOTE — Letter (Signed)
Summary: New Patient letter  [facility]  [address] [place], [place] [address]   Phone: [phone]  Fax: [phone]       04/19/2010 MRN: [id number]  Lisa Cole [address] [place], [place]  [address]  Dear [name],  Welcome to the Gastroenterology Division at [facility].    You are scheduled to see Dr. Arlyce Dice on 06/19/2010 at 3:45PM on the 3rd floor at [facility], [address]  We ask that you try to arrive at our office 15 minutes prior to your appointment time to allow for check-in.  We would like you to complete the enclosed self-administered evaluation form prior to your visit and bring it with you on the day of your appointment.  We will review it with you.  Also, please bring a complete list of all your medications or, if you prefer, bring the medication bottles and we will list them.  Please bring your insurance card so that we may make a copy of it.  If your insurance requires a referral to see a specialist, please bring your referral form from your primary care physician.  Co-payments are due at the time of your visit and may be paid by cash, check or credit card.     Your office visit will consist of a consult with your physician (includes a physical exam), any laboratory testing he/she may order, scheduling of any necessary diagnostic testing (e.g. x-ray, ultrasound, CT-scan), and scheduling of a procedure (e.g. Endoscopy, Colonoscopy) if required.  Please allow enough time on your schedule to allow for any/all of these possibilities.    If you cannot keep your appointment, please call [phone] to cancel or reschedule prior to your appointment date.  This allows [place] the opportunity to schedule an appointment for another patient in need of care.  If you do not cancel or reschedule by 5 p.m. the business day prior to your appointment date, you will be charged a $50.00 late cancellation/no-show fee.    Thank you for choosing  [facility] for your medical needs.  We appreciate the opportunity to care for you.  Please visit [place] at our website  to learn more about our practice.                     Sincerely,                                                             The Gastroenterology Division

## 2010-10-24 NOTE — Letter (Signed)
Summary: Results Follow-up Letter  [facility]  [address] [place], [place] [address]   Phone: [phone]  Fax: [phone]    07/23/2010  [address] [place], [place]  [address]  Dear [name],   The following are the results of your recent test(s):  Test     Result     Brain MRI     normal   _________________________________________________________  Please call for an appointment as directed _________________________________________________________ _________________________________________________________ _________________________________________________________  Sincerely,  Sanda Linger MD [facility]

## 2010-10-24 NOTE — Assessment & Plan Note (Signed)
Summary: discuss wt loss program, discuss hormones/cd   Vital Signs:  Patient profile:   30 year old female Height:      60 inches Weight:      188 pounds BMI:     36.85 O2 Sat:      98 % on Room air Temp:     98.1 degrees F oral Pulse rate:   75 / minute Pulse rhythm:   regular Resp:     16 per minute BP sitting:   104 / 68  (left arm) Cuff size:   large  Vitals Entered By: Rock Nephew CMA (Feb 16, 2010 3:48 PM)  Nutrition Counseling: Patient's BMI is greater than 25 and therefore counseled on weight management options.  O2 Flow:  Room air  Primary Care Provider:  Etta Grandchild MD   History of Present Illness: She returns for f/up with complaints. 1. She feels forgetful and scatterred and thinks she may have ADHD or PTSD. 2. She wants to try Adipex again for weight loss 3. She needs a DM f/up, has not seen her Endo Doctor in one year.  Preventive Screening-Counseling & Management  Alcohol-Tobacco     Alcohol drinks/day: 0     Smoking Status: never     Passive Smoke Exposure: no  Hep-HIV-STD-Contraception     Hepatitis Risk: no risk noted     HIV Risk: no     STD Risk: no risk noted  Current Medications (verified): 1)  Alora 0.05 Mg/24hr Pttw (Estradiol) .... Apply One Patch Per Week 2)  Onetouch Ultra Test  Strp (Glucose Blood) .... Qd, and Lancets 250.00 3)  Adipex-P 37.5 Mg Caps (Phentermine Hcl) .... One Once Daily For Appetite Suppression  Allergies (verified): 1)  ! Penicillin 2)  ! Amoxicillin  Past History:  Past Medical History: Reviewed history from 07/08/2008 and no changes required. endometriosis Type 2 diabetes mellitus  Hashimoto's thyroiditis Anemia BV  Past Surgical History: Reviewed history from 07/08/2008 and no changes required. Hysterectomy Oophorectomy  Family History: Reviewed history from 07/08/2008 and no changes required. Family History of Alcoholism/Addiction Family History of Arthritis Family History Diabetes 1st  degree relative Family History High cholesterol Family History Hypertension Family History Kidney disease Family History of Cardiovascular disorder  Social History: Reviewed history from 07/08/2008 and no changes required. 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   Psych:  Complains of irritability; denies anxiety, depression, easily angered, easily tearful, mental problems, panic attacks, sense of great danger, suicidal thoughts/plans, thoughts of violence, and unusual visions or sounds. Endo:  Denies cold intolerance, excessive hunger, excessive thirst, excessive urination, heat intolerance, polyuria, and weight change. Heme:  Denies abnormal bruising, bleeding, enlarge lymph nodes, fevers, pallor, and skin discoloration.  Physical Exam  General:  alert, well-developed, well-nourished, well-hydrated, and overweight-appearing.   Head:  normocephalic, atraumatic, no abnormalities observed, and no abnormalities palpated.   Eyes:  vision grossly intact, pupils equal, pupils round, and pupils reactive to light.   Ears:  R ear normal and L ear normal.   Mouth:  Oral mucosa and oropharynx without lesions or exudates.  Teeth in good repair. Neck:  supple, full ROM, no masses, no thyromegaly, no JVD, normal carotid upstroke, no  carotid bruits, no cervical lymphadenopathy, and no neck tenderness.   Lungs:  normal respiratory effort, no intercostal retractions, no accessory muscle use, normal breath sounds, no dullness, no fremitus, no crackles, and no wheezes.   Heart:  normal rate, regular rhythm, no murmur, no gallop, no rub, and no JVD.   Abdomen:   soft, non-tender, normal bowel sounds, no distention, no masses, no guarding, no rigidity, no rebound tenderness, no hepatomegaly, and no splenomegaly.   Msk:  normal ROM, no joint tenderness, no joint swelling, no joint warmth, no redness over joints, no joint deformities, no joint instability, no crepitation, and no muscle atrophy.   Pulses:  R and L carotid,radial,femoral,dorsalis pedis and posterior tibial pulses are full and equal bilaterally Extremities:  No clubbing, cyanosis, edema, or deformity noted with normal full range of motion of all joints.   Neurologic:  No cranial nerve deficits noted. Station and gait are normal. Plantar reflexes are down-going bilaterally. DTRs are symmetrical throughout. Sensory, motor and coordinative functions appear intact. Skin:  turgor normal, color normal, no rashes, no suspicious lesions, no ecchymoses, no petechiae, no purpura, no ulcerations, and no edema.   Cervical Nodes:  no anterior cervical adenopathy and no posterior cervical adenopathy.   Axillary Nodes:  no R axillary adenopathy and no L axillary adenopathy.   Inguinal Nodes:  no R inguinal adenopathy and no L inguinal adenopathy.   Psych:  Cognition and judgment appear intact. Alert and cooperative with normal attention span and concentration. No apparent delusions, illusions, hallucinations  Diabetes Management Exam:    Foot Exam (with socks and/or shoes not present):       Sensory-Pinprick/Light touch:          Left medial foot (L-4): normal          Left dorsal foot (L-5): normal          Left lateral foot (S-1): normal          Right medial foot (L-4): normal          Right dorsal foot (L-5): normal          Right lateral foot (S-1): normal       Sensory-Monofilament:          Left foot: normal          Right foot: normal       Inspection:          Left foot: normal          Right foot: normal       Nails:          Left foot: normal          Right foot: normal   Impression &  Recommendations:  Problem # 1:  ADHD (ICD-314.01) Assessment New  Orders: Psychology Referral (Psychology)  Problem # 2:  HASHIMOTO'S THYROIDITIS (ICD-245.2) Assessment: New  Orders: Venipuncture ([address]) TLB-B12 + Folate Pnl (60454_09811-B14/NWG) TLB-IBC Pnl (Iron/FE;Transferrin) (83550-IBC) TLB-Lipid Panel (80061-LIPID) TLB-BMP (Basic Metabolic Panel-BMET) (80048-METABOL) TLB-CBC Platelet - w/Differential (85025-CBCD) TLB-Hepatic/Liver Function Pnl (80076-HEPATIC) TLB-TSH (Thyroid Stimulating Hormone) (84443-TSH) TLB-A1C / Hgb A1C (Glycohemoglobin) (83036-A1C)  Problem # 3:  OBESITY, UNSPECIFIED (ICD-278.00) Assessment: Unchanged  Orders: Venipuncture ([address]) TLB-B12 + Folate Pnl (30865_78469-G29/BMW) TLB-IBC Pnl (Iron/FE;Transferrin) (83550-IBC) TLB-Lipid Panel (80061-LIPID) TLB-BMP (Basic Metabolic Panel-BMET) (80048-METABOL) TLB-CBC Platelet - w/Differential (85025-CBCD) TLB-Hepatic/Liver Function Pnl (80076-HEPATIC) TLB-TSH (Thyroid Stimulating Hormone) (84443-TSH) TLB-A1C / Hgb A1C (Glycohemoglobin) (83036-A1C)  Ht: 60 (02/16/2010)   Wt: 188 (02/16/2010)   BMI: 36.85 (02/16/2010)  Problem #  4:  DIAB W/UNS COMP TYPE II/UNS NOT STATED UNCNTRL (ICD-250.90) Assessment: Unchanged  Orders: Venipuncture ([address]) TLB-B12 + Folate Pnl (60454_09811-B14/NWG) TLB-IBC Pnl (Iron/FE;Transferrin) (83550-IBC) TLB-Lipid Panel (80061-LIPID) TLB-BMP (Basic Metabolic Panel-BMET) (80048-METABOL) TLB-CBC Platelet - w/Differential (85025-CBCD) TLB-Hepatic/Liver Function Pnl (80076-HEPATIC) TLB-TSH (Thyroid Stimulating Hormone) (84443-TSH) TLB-A1C / Hgb A1C (Glycohemoglobin) (83036-A1C) Ophthalmology Referral (Ophthalmology)  Labs Reviewed: Creat: 0.8 (03/11/2009)    Reviewed HgBA1c results: 6.3 (03/11/2009)  6.3 (11/03/2008)  Complete Medication List: 1)  Alora 0.05 Mg/24hr Pttw (Estradiol) .... Apply one patch per week 2)  Onetouch Ultra Test Strp (Glucose blood) .... Qd,  and lancets 250.00 3)  Adipex-p 37.5 Mg Caps (Phentermine hcl) .... One once daily for appetite suppression  Patient Instructions: 1)  Please schedule a follow-up appointment in 1 month. 2)  It is important that you exercise regularly at least 20 minutes 5 times a week. If you develop chest pain, have severe difficulty breathing, or feel very tired , stop exercising immediately and seek medical attention. 3)  You need to lose weight. Consider a lower calorie diet and regular exercise.  4)  Check your blood sugars regularly. If your readings are usually above 200  or below 70 you should contact our office. 5)  It is important that your Diabetic A1c level is checked every 3 months. 6)  See your eye doctor yearly to check for diabetic eye damage. 7)  Check your feet each night for sore areas, calluses or signs of infection. 8)  Check your Blood Pressure regularly. If it is above 130/80: you should make an appointment. Prescriptions: ADIPEX-P 37.5 MG CAPS (PHENTERMINE HCL) One once daily for appetite suppression  #30 x 2   Entered and Authorized by:   Etta Grandchild MD   Signed by:   Etta Grandchild MD on 02/16/2010   Method used:   Print then Give to Patient   RxID:   [id number]

## 2010-10-24 NOTE — Letter (Signed)
Summary: Out of Work  [facility]  [address] [place], [place] [address]   Phone: [phone]  Fax: [phone]    Feb 16, 2010   Employee:  Lisa Cole    To Whom It May Concern:   For Medical reasons, please excuse the above named employee from work. She left work at [employer]   for a 3:30pm appt     If you need additional information, please feel free to contact our office.         Sincerely,    Alvy Beal A CMA Dr Yetta Barre

## 2010-10-26 NOTE — Progress Notes (Signed)
Summary: REFERRAL  Phone Note Call from Patient   Summary of Call: Patient is requesting referral from Dr Yetta Barre to see Dr Everardo All.  Initial call taken by: Lamar Sprinkles, CMA,  October 19, 2010 12:08 PM

## 2010-11-02 ENCOUNTER — Telehealth: Payer: Self-pay | Admitting: Internal Medicine

## 2010-11-07 ENCOUNTER — Other Ambulatory Visit (HOSPITAL_COMMUNITY): Payer: Self-pay | Admitting: Obstetrics and Gynecology

## 2010-11-07 DIAGNOSIS — R1031 Right lower quadrant pain: Secondary | ICD-10-CM

## 2010-11-09 ENCOUNTER — Encounter (INDEPENDENT_AMBULATORY_CARE_PROVIDER_SITE_OTHER): Payer: Self-pay | Admitting: *Deleted

## 2010-11-09 ENCOUNTER — Ambulatory Visit (HOSPITAL_COMMUNITY)
Admission: RE | Admit: 2010-11-09 | Discharge: 2010-11-09 | Disposition: A | Discharge status: Home / Self Care | Payer: BC Managed Care – PPO | Source: Ambulatory Visit | Attending: Obstetrics and Gynecology | Admitting: Obstetrics and Gynecology

## 2010-11-09 ENCOUNTER — Ambulatory Visit: Payer: Self-pay | Admitting: Endocrinology

## 2010-11-09 DIAGNOSIS — Z90710 Acquired absence of both cervix and uterus: Secondary | ICD-10-CM | POA: Insufficient documentation

## 2010-11-09 DIAGNOSIS — R1031 Right lower quadrant pain: Secondary | ICD-10-CM

## 2010-11-09 DIAGNOSIS — R1032 Left lower quadrant pain: Secondary | ICD-10-CM | POA: Insufficient documentation

## 2010-11-09 DIAGNOSIS — K7689 Other specified diseases of liver: Secondary | ICD-10-CM | POA: Insufficient documentation

## 2010-11-09 MED ORDER — IOHEXOL 300 MG/ML  SOLN
100.0000 mL | Freq: Once | INTRAMUSCULAR | Status: AC | PRN
  Administered 2010-11-09: 100 mL via INTRAVENOUS

## 2010-11-09 NOTE — Progress Notes (Signed)
  Phone Note Call from Patient   Caller: Patient Summary of Call: Patient lmovm stating that she had a hysterectomy in 2008. She wanted to inform MD that she has noticed pain and spotting after urinating, low iron level and nausea. She recently saw GYN who is ordering a CT scan of her overies. She wants to know if she need to also come in and see our office.Marland KitchenMarland KitchenAlvy Beal Archie CMA  November 02, 2010 1:37 PM    Patient notified to have her testing done and call back if GYN feels this is PCP related.Alvy Beal Archie CMA  November 02, 2010 3:00 PM

## 2010-11-15 ENCOUNTER — Encounter (INDEPENDENT_AMBULATORY_CARE_PROVIDER_SITE_OTHER): Payer: Self-pay | Admitting: *Deleted

## 2010-11-20 ENCOUNTER — Ambulatory Visit: Payer: BC Managed Care – PPO | Admitting: Endocrinology

## 2010-11-21 NOTE — Letter (Signed)
Summary: New Patient letter  [facility]  [address] [place], [place] [address]   Phone: [phone]  Fax: [phone]       11/15/2010 MRN: [id number]  Lisa Cole [address] [place], [place]  [address]  Dear [name],  Welcome to the Gastroenterology Division at [facility].    You are scheduled to see Dr.  Leone Payor on 12-07-10 at 2:45P.M. on the 3rd floor at [facility], [address]  We ask that you try to arrive at our office 15 minutes prior to your appointment time to allow for check-in.  We would like you to complete the enclosed self-administered evaluation form prior to your visit and bring it with you on the day of your appointment.  We will review it with you.  Also, please bring a complete list of all your medications or, if you prefer, bring the medication bottles and we will list them.  Please bring your insurance card so that we may make a copy of it.  If your insurance requires a referral to see a specialist, please bring your referral form from your primary care physician.  Co-payments are due at the time of your visit and may be paid by cash, check or credit card.     Your office visit will consist of a consult with your physician (includes a physical exam), any laboratory testing he/she may order, scheduling of any necessary diagnostic testing (e.g. x-ray, ultrasound, CT-scan), and scheduling of a procedure (e.g. Endoscopy, Colonoscopy) if required.  Please allow enough time on your schedule to allow for any/all of these possibilities.    If you cannot keep your appointment, please call [phone] to cancel or reschedule prior to your appointment date.  This allows [place] the opportunity to schedule an appointment for another patient in need of care.  If you do not cancel or reschedule by 5 p.m. the business day prior to your appointment date, you will be charged a $50.00 late cancellation/no-show fee.    Thank you for choosing  [facility] for your medical needs.  We appreciate the opportunity to care for you.  Please visit [place] at our website  to learn more about our practice.                     Sincerely,                                                             The Gastroenterology Division

## 2010-11-28 ENCOUNTER — Telehealth: Payer: Self-pay | Admitting: Internal Medicine

## 2010-11-29 ENCOUNTER — Other Ambulatory Visit: Payer: Self-pay | Admitting: Family Medicine

## 2010-11-29 DIAGNOSIS — M25551 Pain in right hip: Secondary | ICD-10-CM

## 2010-11-29 DIAGNOSIS — M545 Low back pain, unspecified: Secondary | ICD-10-CM

## 2010-12-03 ENCOUNTER — Other Ambulatory Visit: Payer: BC Managed Care – PPO

## 2010-12-03 ENCOUNTER — Inpatient Hospital Stay: Admission: RE | Admit: 2010-12-03 | Payer: BC Managed Care – PPO | Source: Ambulatory Visit

## 2010-12-05 NOTE — Progress Notes (Signed)
Summary: FYI  Phone Note Call from Patient Call back at [facility] Phone [phone]   Summary of Call: Patient called w/update. Dr Vincente Poli, pt's GYN dx pt w/interstitial cystitis and "some things going on" with her pelvic bone. Going to [facility] to see specialist to have her "bladder stretched". Also has seen ortho - MRI scheduled for bursitis of the hip and advised her she need knee replacment but was told she was too young for the procedure.   She will call back with further info for MD after the additional apt's.   Initial call taken by: Lamar Sprinkles, CMA,  November 28, 2010 9:35 AM

## 2010-12-07 ENCOUNTER — Other Ambulatory Visit: Payer: Self-pay | Admitting: Endocrinology

## 2010-12-07 ENCOUNTER — Encounter: Payer: Self-pay | Admitting: Endocrinology

## 2010-12-07 ENCOUNTER — Encounter (INDEPENDENT_AMBULATORY_CARE_PROVIDER_SITE_OTHER): Payer: Self-pay | Admitting: *Deleted

## 2010-12-07 ENCOUNTER — Ambulatory Visit (INDEPENDENT_AMBULATORY_CARE_PROVIDER_SITE_OTHER): Payer: BC Managed Care – PPO | Admitting: Internal Medicine

## 2010-12-07 ENCOUNTER — Other Ambulatory Visit: Payer: BC Managed Care – PPO

## 2010-12-07 ENCOUNTER — Ambulatory Visit
Admission: RE | Admit: 2010-12-07 | Discharge: 2010-12-07 | Disposition: A | Discharge status: Home / Self Care | Payer: BC Managed Care – PPO | Source: Ambulatory Visit | Attending: Family Medicine | Admitting: Family Medicine

## 2010-12-07 ENCOUNTER — Ambulatory Visit (INDEPENDENT_AMBULATORY_CARE_PROVIDER_SITE_OTHER): Payer: BC Managed Care – PPO | Admitting: Endocrinology

## 2010-12-07 ENCOUNTER — Encounter: Payer: Self-pay | Admitting: Internal Medicine

## 2010-12-07 DIAGNOSIS — R109 Unspecified abdominal pain: Secondary | ICD-10-CM | POA: Insufficient documentation

## 2010-12-07 DIAGNOSIS — E063 Autoimmune thyroiditis: Secondary | ICD-10-CM

## 2010-12-07 DIAGNOSIS — M25551 Pain in right hip: Secondary | ICD-10-CM

## 2010-12-07 DIAGNOSIS — M545 Low back pain, unspecified: Secondary | ICD-10-CM

## 2010-12-07 DIAGNOSIS — E118 Type 2 diabetes mellitus with unspecified complications: Secondary | ICD-10-CM

## 2010-12-07 DIAGNOSIS — R1319 Other dysphagia: Secondary | ICD-10-CM | POA: Insufficient documentation

## 2010-12-07 DIAGNOSIS — K589 Irritable bowel syndrome without diarrhea: Secondary | ICD-10-CM

## 2010-12-07 DIAGNOSIS — K5909 Other constipation: Secondary | ICD-10-CM | POA: Insufficient documentation

## 2010-12-12 NOTE — Letter (Signed)
Summary: EGD Instructions  [facility]  [address] [place], [place] [address]   Phone: [phone]  Fax: [phone]       Lisa Cole    12/07/1980    MRN: [id number]       Procedure Day /Date: Monday [name] 16th, 2012     Arrival Time:  8:30am     Procedure Time:9:30am     Location of Procedure:                     _ x _ [facility] ( Outpatient Registration)    PREPARATION FOR ENDOSCOPY   On 01/08/11 THE DAY OF THE PROCEDURE:  1.   No solid foods, milk or milk products are allowed after midnight the night before your procedure.  2.   Do not drink anything colored red or purple.  Avoid juices with pulp.  No orange juice.  3.  You may drink clear liquids until 5:30am, which is 4 hours before your procedure.                                                                                                CLEAR LIQUIDS INCLUDE: Water Jello Ice Popsicles Tea (sugar ok, no milk/cream) Powdered fruit flavored drinks Coffee (sugar ok, no milk/cream) Gatorade Juice: apple, white grape, white cranberry  Lemonade Clear bullion, consomm, broth Carbonated beverages (any kind) Strained chicken noodle soup Hard Candy   MEDICATION INSTRUCTIONS  Unless otherwise instructed, you should take regular prescription medications with a small sip of water as early as possible the morning of your procedure.       OTHER INSTRUCTIONS  You will need a responsible adult at least 31 years of age to accompany you and drive you home.   This person must remain in the waiting room during your procedure.  Wear loose fitting clothing that is easily removed.  Leave jewelry and other valuables at home.  However, you may wish to bring a book to read or an iPod/MP3 player to listen to music as you wait for your procedure to start.  Remove all body piercing jewelry and leave at home.  Total time from sign-in until discharge is approximately 2-3 hours.  You should go  home directly after your procedure and rest.  You can resume normal activities the day after your procedure.  The day of your procedure you should not:   Drive   Make legal decisions   Operate machinery   Drink alcohol   Return to work  You will receive specific instructions about eating, activities and medications before you leave.    The above instructions have been reviewed and explained to me by   _______________________    I fully understand and can verbalize these instructions _____________________________ Date _________

## 2010-12-12 NOTE — Assessment & Plan Note (Signed)
Summary: new endo/reschd from bump list/cd   Vital Signs:  Patient profile:   30 year old female Menstrual status:  hysterectomy Height:      60 inches (152.40 cm) Weight:      195.25 pounds (88.75 kg) BMI:     38.27 O2 Sat:      97 % on Room air Temp:     98.3 degrees F (36.83 degrees C) oral Pulse rate:   102 / minute BP sitting:   112 / 68  (left arm) Cuff size:   large  Vitals Entered By: Brenton Grills CMA Duncan Dull) (December 07, 2010 1:31 PM)  O2 Flow:  Room air CC: New Endo Consult/DM/? about BP/aj Is Patient Diabetic? Yes   Primary Provider:  Etta Grandchild MD  CC:  New Endo Consult/DM/? about BP/aj.  History of Present Illness: pt states 11 years h/o dm.  she is unaware of any chronic complications.  she has never been on insulin. she took metformin and januvia at one time, but she has been off all medication for dm x 2 years.   pt says her diet is "not good," and exercise limited by medical conditions.   symptomatically, pt states few years of moderate pain at both feet, and assoc numbness.    Current Medications (verified): 1)  Alora 0.05 Mg/24hr Pttw (Estradiol) .... Apply One Patch Per Week 2)  Onetouch Ultra Test  Strp (Glucose Blood) .... Qd, and Lancets 250.00 3)  Adipex-P 37.5 Mg Caps (Phentermine Hcl) .... One Once Daily For Appetite Suppression 4)  Methocarbamol 500 Mg Tabs (Methocarbamol) .Marland Kitchen.. 1-2 Tablets Every 6 Hours As Needed For Spasms/headaches 5)  Diazepam 5 Mg Tabs (Diazepam) .Marland Kitchen.. 1 Tablet By Mouth At Bedtime 6)  Amitiza 8 Mcg Caps (Lubiprostone) .Marland Kitchen.. 1 Capsule Every Morning 7)  Urelle 81 Mg Tabs (Meth-Hyo-M Bl-Na Phos-Ph Sal) .Marland Kitchen.. 1 Tablet Every 6 Hours As Needed For Bladder Spasms 8)  Oxaprozin 600 Mg Tabs (Oxaprozin) .Marland Kitchen.. 1 Tablet By Mouth Two Times A Day As Needed 9)  Hydrocodone-Acetaminophen 5-500 Mg Tabs (Hydrocodone-Acetaminophen) .Marland Kitchen.. 1-2 Tablets Q 4 To 6 Hours As Needed For Pain  Allergies (verified): 1)  ! Penicillin 2)  !  Amoxicillin  Past History:  Past Medical History: Last updated: 12/01/2010 endometriosis Type 2 diabetes mellitus  Hashimoto's thyroiditis Anemia BV Hepatic Steatosis  Family History: Reviewed history from 07/08/2008 and no changes required. Family History of Alcoholism/Addiction Family History of Arthritis Family History Diabetes:  dtr has type 2 dm Family History High cholesterol Family History Hypertension Family History Kidney disease Family History of Cardiovascular disorder hashimoto's thyroiditis:  dtr  Social History: Reviewed history from 07/08/2008 and no changes required. 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         denies excessive diaphoresis, memory loss, hypoglycemia, and rhinorrhea.  she reports headache, chest pain, doe, nausea, polyuria, muscle cramps, easy bruising,and blurry vision.  she reports mild depression.  she has no menses ([place]).  Physical Exam  General:  obese.  no distress  Head:  head: no deformity eyes: no periorbital swelling, no proptosis external nose and ears are normal mouth: no lesion seen Neck:  Supple without thyroid enlargement or tenderness.  Lungs:  Clear to auscultation bilaterally. Normal respiratory effort.  Heart:  Regular rate and rhythm without murmurs or gallops noted. Normal S1,S2.   Abdomen:  central obesity.  abdomen is soft, nontender.  no hepatosplenomegaly.   not distended.  no hernia  Msk:  muscle bulk and strength are grossly normal.  no obvious joint swelling.  gait is normal and steady  Pulses:  dorsalis pedis intact bilat.  no carotid bruit  Extremities:  no deformity.  no ulcer on the feet.  feet are of normal color and temp.  no edema  Neurologic:  cn 2-12 grossly intact.   readily moves all 4's.   sensation is intact to touch on the feet Skin:  normal texture and temp.  no rash.  not  diaphoretic  Cervical Nodes:  No significant adenopathy.  Psych:  Alert and cooperative; normal mood and affect; normal attention span and concentration.     Impression & Recommendations:  Problem # 1:  DIAB W/UNS COMP TYPE II/UNS NOT STATED UNCNTRL (ICD-250.90) uncertain control  Problem # 2:  HASHIMOTO'S THYROIDITIS (ICD-245.2) pt is at risk for hypothyroidism.  Problem # 3:  FATTY LIVER DISEASE (ICD-571.8) actos would help this, but may cause edema  Problem # 4:  leg pain ? neuropathic  Medications Added to Medication List This Visit: 1)  Methocarbamol 500 Mg Tabs (Methocarbamol) .Marland Kitchen.. 1-2 tablets every 6 hours as needed for spasms/headaches 2)  Diazepam 5 Mg Tabs (Diazepam) .Marland Kitchen.. 1 tablet by mouth at bedtime 3)  Amitiza 8 Mcg Caps (Lubiprostone) .Marland Kitchen.. 1 capsule every morning 4)  Urelle 81 Mg Tabs (Meth-hyo-m bl-na phos-ph sal) .Marland Kitchen.. 1 tablet every 6 hours as needed for bladder spasms 5)  Oxaprozin 600 Mg Tabs (Oxaprozin) .Marland Kitchen.. 1 tablet by mouth two times a day as needed 6)  Hydrocodone-acetaminophen 5-500 Mg Tabs (Hydrocodone-acetaminophen) .Marland Kitchen.. 1-2 tablets q 4 to 6 hours as needed for pain 7)  Onetouch Ultra Blue Strp (Glucose blood) .... Once daily, and lancets 250.00  Other Orders: TLB-TSH (Thyroid Stimulating Hormone) (84443-TSH) TLB-A1C / Hgb A1C (Glycohemoglobin) (83036-A1C) Consultation Level IV ([address])  Patient Instructions: 1)  here are 2 new blood-sugar meters.  i have sent a prescription for strips to your pharmacy. 2)  check your blood sugar 1 time a day.  vary the time of day when you check, between before the 3 meals, and at bedtime.  also check if you have symptoms of your blood sugar being too high or too low.  please keep a record of the readings and bring it to your next appointment here.  please call us sooner if you are having low blood sugar episodes. 3)  good diet and exercise habits significanly improve the control of your diabetes.  please let me know if  you wish to be referred to a dietician, or for weight-loss surgery (you should consider doing both of these).  high blood sugar is very risky to your health.  you should see an eye doctor every year. 4)  controlling your blood pressure and cholesterol drastically reduces the damage diabetes does to your body.  this also applies to quitting smoking.  please discuss these with your doctor.  5)  blood tests are being ordered for you today.  please call [phone] to hear your test results. 6)  Please schedule a follow-up appointment in 3 months. Prescriptions: ONETOUCH ULTRA BLUE  STRP (GLUCOSE BLOOD) once daily, and lancets 250.00  #50 x 8   Entered and Authorized by:   Minus Breeding MD   Signed by:   Minus Breeding MD on 12/07/2010   Method used:   Electronically to  [facility] [address] [id number] (retail)       [address] [place], [place]  [address]       Ph: [phone]       Fax: [phone]   RxID:   [id number]    Orders Added: 1)  TLB-TSH (Thyroid Stimulating Hormone) [84443-TSH] 2)  TLB-A1C / Hgb A1C (Glycohemoglobin) [83036-A1C] 3)  Consultation Level IV [83151]

## 2010-12-12 NOTE — Letter (Signed)
Summary: Out of Work  [facility]  [address] [place], [place] [address]   Phone: [phone]  Fax: [phone]    December 07, 2010   Employee:  [name]    To Whom It May Concern:   For Medical reasons, please excuse the above named employee from work for the following dates:  Start:   12/07/10  End:   12/07/10  If you need additional information, please feel free to contact our office.         Sincerely,    Christie Nottingham CMA (AAMA)

## 2010-12-12 NOTE — Assessment & Plan Note (Signed)
Summary: ABD PAIN//SCH'D W/PT//MEDLIST//CX POLICY ADVISED//[employer]   History of Present Illness Visit Type: Initial Consult Primary GI MD: Stan Head MD [facility] Primary Provider: Etta Grandchild MD Requesting Provider: Etta Grandchild MD Chief Complaint: Chronic abdominal pain and constipation History of Present Illness:   30 yo African-american woman with many years of abdominal pain and constipation and has seen gastroenterologists (at least 2 - [place] years ago and Dr. Bosie Clos more recently). she believes problems began in teens at least. here with boyfriend.  Constipation:  moving bowels 1 or 2 times a month for years Dr. Vincente Poli started Amitiza and is moving bowels 3-4 times a day. Slightly less abdominal pain. Colonoscpy as a teen. Not using much hydrocodone. Tried MiraLaxx in past witout success   Abdominal pain is increased pre-defecatory but there is a constant buring pain that may be worse with movement. She can sense or feel liquids or food moving through her belly and it scares her. Pain disturbs sleep. stress and anxiety will increase the pain. she has been given a iagnosis of IBS.  Solid food dysphagia for past 2 months. Suprsternal sticking point with burning pain when she drinks sodas. +++nausea  She has been diagnosed with interstital cystitis by Dr. Vincente Poli and is due to see Dr. Logan Bores at [facility] in 2 weeks. Had seen GU in [place] years ago. No bleeding. All other GI ROS negative.             Current Medications (verified): 1)  Alora 0.05 Mg/24hr Pttw (Estradiol) .... Apply One Patch Per Week 2)  Onetouch Ultra Test  Strp (Glucose Blood) .... Qd, and Lancets 250.00 3)  Methocarbamol 500 Mg Tabs (Methocarbamol) .Marland Kitchen.. 1-2 Tablets Every 6 Hours As Needed For Spasms/headaches 4)  Diazepam 5 Mg Tabs (Diazepam) .Marland Kitchen.. 1 Tablet By Mouth At Bedtime 5)  Amitiza 8 Mcg Caps (Lubiprostone) .Marland Kitchen.. 1 Capsule Every Morning 6)  Urelle 81 Mg Tabs (Meth-Hyo-M Bl-Na Phos-Ph Sal)  .Marland Kitchen.. 1 Tablet Every 6 Hours As Needed For Bladder Spasms 7)  Oxaprozin 600 Mg Tabs (Oxaprozin) .Marland Kitchen.. 1 Tablet By Mouth Two Times A Day As Needed 8)  Hydrocodone-Acetaminophen 5-500 Mg Tabs (Hydrocodone-Acetaminophen) .Marland Kitchen.. 1-2 Tablets Q 4 To 6 Hours As Needed For Pain 9)  Onetouch Ultra Blue  Strp (Glucose Blood) .... Once Daily, and Lancets 250.00 10)  Climara 0.0375 Mg/24hr Ptwk (Estradiol) .Marland Kitchen.. 1 Patch Applied Once A Week  Allergies (verified): 1)  ! Penicillin 2)  ! Amoxicillin  Past History:  Past Medical History: endometriosis Type 2 diabetes mellitus  Hashimoto's thyroiditis Anemia BV Hepatic Steatosis Anxiety Disorder Arthritis Asthma Irritable Bowel Syndrome Obesity ?Interstitial Cystitis Chronic pelvic pain  Past Surgical History: Total abdominal hysterectomy, bilateral salpingo-  oophorectomy and Burch retropubic urethropexy 2008  Family History: Family History of Alcoholism/Addiction Family History of Arthritis Family History Diabetes:  dtr has type 2 dm Family History High cholesterol Family History Hypertension Family History Kidney disease Family History of Cardiovascular disorder hashimoto's thyroiditis:  dtr Family History of Colon Polyps:Uncle Family History of Colon Cancer: Uncle Family History of Prostate Cancer:Grandfather,uncles Family History of Liver Disease/Cirrhosis:Grandmother and grandfather  Social History: Reviewed history from 07/08/2008 and no changes required. Occupation: Runner, broadcasting/film/video Never Smoked Alcohol use-no Drug use-no Regular exercise-no  Review of Systems       The patient complains of allergy/sinus, anxiety-new, arthritis/joint pain, back pain, breast changes/lumps, confusion, cough, headaches-new, heart murmur, menstrual pain, muscle pains/cramps, night sweats, sleeping problems, thirst - excessive, urination - excessive, urination  changes/pain, and urine leakage.         All other ROS negative except as per HPI.   Vital  Signs:  Patient profile:   30 year old female Menstrual status:  hysterectomy Height:      60 inches Weight:      194 pounds BMI:     38.02 BSA:     1.84 Pulse rate:   100 / minute Pulse rhythm:   regular BP sitting:   100 / 70  (left arm)  Vitals Entered By: Merri Ray CMA Duncan Dull) (December 07, 2010 2:56 PM)  Physical Exam  General:  obese.  NAD Eyes:  PERRLA, no icterus. Mouth:  clear oro and posterior pharynx Neck:  Supple; no masses or thyromegaly. Lungs:  Clear throughout to auscultation. Heart:  Regular rate and rhythm; no murmurs, rubs,  or bruits. Abdomen:  obese, soft and mildly tender lower quadrants Rectal:  FEMALE STAFF PRESENT: small tags otherwise normal anoderm mildly tender anal canal no mass, formed stool present (brown) good resting tone, god squeeze and appropriate decsent with bearing down 9abdominal pressure appropriatre also)  Extremities:  no edema Neurologic:  Alert and  oriented x4;  Cervical Nodes:  No significant cervical or supraclavicular adenopathy.  Psych:  very talkative and some randomness to speech pattern and topics mild  Impression & Recommendations:  Problem # 1:  OTHER DYSPHAGIA (ICD-787.29) 2 month history with associated chest or esophageal burning. evaluate with EGD, possible dilation.  Risks, benefits,and indications of endoscopic procedure(s) were reviewed with the patient and all questions answered.  Orders: ZEGD (ZEGD)  Problem # 2:  CONSTIPATION, CHRONIC (ICD-564.09) Assessment: New dramatic improvement with 1 8ug Amitiza/day. However she says now she moves her bowels too much. Intersting that i found random colon bxs from 2003 - normal. suggests she has had diarrhea in past as does duodenal bxs that were nl. she can reduce frequency of amitiza to have less bowel movements  Problem # 3:  IRRITABLE BOWEL SYNDROME (ICD-564.1) Assessment: New Chronic issues with pain and bowel habit. Based upon available info i believe  IBS is cause of at least some of this (abd pain and pelvic pain may have other causes)  Problem # 4:  ABDOMINAL PAIN OTHER SPECIFIED SITE (ICD-789.09) Assessment: New years of lower abd pain and pelvic pain CT abd/pelvis with contrast negative 10/2010 has had endometriosis, s/p hysterectomy patient says Dr. Vincente Poli dxed her with interstitial cystitis will see what Dr. Logan Bores says  Problem # 5:  ? of INTERSTITIAL CYSTITIS (ICD-595.1) Assessment: New await Dr. Logan Bores' evaluation  Patient Instructions: 1)  You have been scheduled for a Upper Endoscopy. 2)  Reduce your Amitiza to every other day but do not stop.  3)  Copy sent to : Marcelyn Bruins, MD 4)                         Marcelle Overlie, MD 5)  The medication list was reviewed and reconciled.  All changed / newly prescribed medications were explained.  A complete medication list was provided to the patient / caregiver.

## 2010-12-12 NOTE — Letter (Signed)
Summary: Generic Letter  [facility]  [address] [place], [place] [address]   Phone: [phone]  Fax: [phone]    12/07/2010  [facility] [address] [place], [place]  [address]  [place]  To whom it may concern:  Pt was seen in our office today (12/07/10) by Dr. Romero Belling for an appointment. If you have any questions, please feel free to contact our office.           Sincerely,   Brenton Grills CMA (AAMA)

## 2010-12-13 ENCOUNTER — Other Ambulatory Visit: Payer: Self-pay | Admitting: Family Medicine

## 2010-12-13 DIAGNOSIS — M542 Cervicalgia: Secondary | ICD-10-CM

## 2010-12-13 DIAGNOSIS — M25511 Pain in right shoulder: Secondary | ICD-10-CM

## 2010-12-19 ENCOUNTER — Ambulatory Visit: Payer: BC Managed Care – PPO | Admitting: Physical Therapy

## 2010-12-19 ENCOUNTER — Ambulatory Visit
Admission: RE | Admit: 2010-12-19 | Discharge: 2010-12-19 | Disposition: A | Discharge status: Home / Self Care | Payer: BC Managed Care – PPO | Source: Ambulatory Visit | Attending: Family Medicine | Admitting: Family Medicine

## 2010-12-19 DIAGNOSIS — M542 Cervicalgia: Secondary | ICD-10-CM

## 2010-12-19 DIAGNOSIS — M25511 Pain in right shoulder: Secondary | ICD-10-CM

## 2010-12-21 NOTE — Procedures (Signed)
Summary: Preparation for Endoscopy / [facility]  Preparation for Endoscopy / [facility]   Imported By: Lennie Odor 12/11/2010 12:12:29  _____________________________________________________________________  External Attachment:    Type:   Image     Comment:   External Document

## 2010-12-26 ENCOUNTER — Telehealth: Payer: Self-pay | Admitting: Internal Medicine

## 2010-12-26 NOTE — Telephone Encounter (Signed)
I have asked the patient to please call dr Vincente Poli and ask her to send notes that support the need for colon.  Patient states she will contact Dr Vincente Poli and have the notes sent

## 2010-12-26 NOTE — Telephone Encounter (Signed)
I need to see request for colonoscopy in writing - do not think I have seen those records

## 2010-12-26 NOTE — Telephone Encounter (Signed)
Patient is waiting for her Medicaid to become effective and wants to cancel her procedure until it is in effect.  I have canceled the procedure.  She also says that Dr Vincente Poli is requesting she have a colon at the same time for endometriosis in the colon.  She will call back to reschedule once her Medicaid is in effect.  When she calls do you want to add on a colon?

## 2010-12-27 LAB — URINALYSIS, ROUTINE W REFLEX MICROSCOPIC
Bilirubin Urine: NEGATIVE
Hgb urine dipstick: NEGATIVE
Specific Gravity, Urine: 1.027 (ref 1.005–1.030)
Urobilinogen, UA: 0.2 mg/dL (ref 0.0–1.0)

## 2010-12-28 LAB — CBC
HCT: 36.2 % (ref 36.0–46.0)
MCV: 89.8 fL (ref 78.0–100.0)
Platelets: 204 10*3/uL (ref 150–400)
WBC: 5.1 10*3/uL (ref 4.0–10.5)

## 2010-12-28 LAB — URINALYSIS, ROUTINE W REFLEX MICROSCOPIC
Glucose, UA: NEGATIVE mg/dL
Hgb urine dipstick: NEGATIVE
Protein, ur: NEGATIVE mg/dL

## 2010-12-28 LAB — GC/CHLAMYDIA PROBE AMP, GENITAL: GC Probe Amp, Genital: NEGATIVE

## 2010-12-28 LAB — DIFFERENTIAL
Eosinophils Absolute: 0.1 10*3/uL (ref 0.0–0.7)
Eosinophils Relative: 1 % (ref 0–5)
Lymphs Abs: 1.2 10*3/uL (ref 0.7–4.0)
Monocytes Relative: 11 % (ref 3–12)

## 2010-12-28 LAB — HERPES SIMPLEX VIRUS CULTURE

## 2010-12-28 LAB — POCT PREGNANCY, URINE: Preg Test, Ur: NEGATIVE

## 2010-12-28 LAB — WET PREP, GENITAL
Trich, Wet Prep: NONE SEEN
Yeast Wet Prep HPF POC: NONE SEEN

## 2011-01-02 ENCOUNTER — Other Ambulatory Visit: Payer: Self-pay | Admitting: Internal Medicine

## 2011-01-02 DIAGNOSIS — Z1231 Encounter for screening mammogram for malignant neoplasm of breast: Secondary | ICD-10-CM

## 2011-01-04 ENCOUNTER — Ambulatory Visit
Admission: RE | Admit: 2011-01-04 | Discharge: 2011-01-04 | Disposition: A | Discharge status: Home / Self Care | Payer: BC Managed Care – PPO | Source: Ambulatory Visit | Attending: Internal Medicine | Admitting: Internal Medicine

## 2011-01-04 DIAGNOSIS — Z1231 Encounter for screening mammogram for malignant neoplasm of breast: Secondary | ICD-10-CM

## 2011-01-04 LAB — GLUCOSE, CAPILLARY: Glucose-Capillary: 93 mg/dL (ref 70–99)

## 2011-01-08 ENCOUNTER — Encounter: Payer: BC Managed Care – PPO | Admitting: Internal Medicine

## 2011-01-09 ENCOUNTER — Emergency Department (HOSPITAL_BASED_OUTPATIENT_CLINIC_OR_DEPARTMENT_OTHER)
Admission: EM | Admit: 2011-01-09 | Discharge: 2011-01-09 | Disposition: A | Discharge status: Home / Self Care | Payer: BC Managed Care – PPO | Attending: Emergency Medicine | Admitting: Emergency Medicine

## 2011-01-09 ENCOUNTER — Emergency Department (INDEPENDENT_AMBULATORY_CARE_PROVIDER_SITE_OTHER): Payer: BC Managed Care – PPO

## 2011-01-09 DIAGNOSIS — R079 Chest pain, unspecified: Secondary | ICD-10-CM

## 2011-01-09 DIAGNOSIS — R0602 Shortness of breath: Secondary | ICD-10-CM

## 2011-01-09 DIAGNOSIS — E119 Type 2 diabetes mellitus without complications: Secondary | ICD-10-CM | POA: Insufficient documentation

## 2011-01-09 DIAGNOSIS — Z79899 Other long term (current) drug therapy: Secondary | ICD-10-CM | POA: Insufficient documentation

## 2011-01-09 DIAGNOSIS — J3489 Other specified disorders of nose and nasal sinuses: Secondary | ICD-10-CM | POA: Insufficient documentation

## 2011-01-09 DIAGNOSIS — E669 Obesity, unspecified: Secondary | ICD-10-CM | POA: Insufficient documentation

## 2011-01-09 DIAGNOSIS — R05 Cough: Secondary | ICD-10-CM | POA: Insufficient documentation

## 2011-01-09 DIAGNOSIS — R059 Cough, unspecified: Secondary | ICD-10-CM | POA: Insufficient documentation

## 2011-01-29 ENCOUNTER — Encounter: Payer: Self-pay | Admitting: Internal Medicine

## 2011-02-09 NOTE — H&P (Signed)
[facility]  Patient:    Lisa Cole, Lisa Cole Visit Number: [id number] MRN: [id number]          Service Type: Attending:  Gaetano Hawthorne. [name], M.D. Dictated by:   Gaetano Hawthorne [name], M.D. Adm. Date:  06/23/01                           History and Physical  CHIEF COMPLAINT:              Chronic right lower quadrant pain.  HISTORY:                      The patient is a 30 year old gravida 2, para 2, with two normal spontaneous vaginal deliveries, who for the past three to four months has had a significant complaint of persistent right lower quadrant pain.  She stated these pains have been consistent throughout the month but get worse right before her menses and during her menses.  She does suffer from dysmenorrhea.  The last menstrual period, she had also spotted a few days prior to her menses.  She has been in the past on Lo/Ovral and Depo-Provera and has had dysfunctional uterine bleeding and has been using barrier contraceptions recently.  She was seen by the nurse practitioner in the office and was treated with Floxin but her symptoms had not changed.  She states that her bowel movements interchanged between loose stools and constipation but her pain is usually localized to the right lower quadrant.  She did have an ultrasound on July 2nd of this year which was essentially unremarkable and when she was seen in preoperative consultation on September 9th, her abdominopelvic examination demonstrated she had tenderness in the right lower quadrant but no rebound or guarding.  We had discussed either proceeding with a full GI evaluation or diagnostic laparoscopy to rule out endometriosis and/or pelvic adhesions; she decided to proceed with a diagnostic laparoscopy first and literature from [employer] of OB/GYN was provided.  Of note, she has had a TSH and prolactin recently which were also normal.  ALLERGIES:                    Patient is allergic to  PENICILLIN.  PAST MEDICAL HISTORY:         She has had two normal spontaneous vaginal deliveries and has been treated for PID in the past, has had anemia with her pregnancies, mild dysplasia in 1999.  PHYSICAL EXAMINATION:  GENERAL:                      Well-developed, well-nourished female.  HEENT:                        Unremarkable.  NECK:                         Supple.  Trachea midline.  No carotid bruits. No thyromegaly.  LUNGS:                        Clear to auscultation without rhonchi or wheezing.  HEART:                        Regular rate and rhythm.  No murmurs or gallops.   BREASTS:  Exam not done.  ABDOMEN:                      Soft.  Tenderness elicited in the right lower quadrant but no rebound or guarding.  PELVIC:                       No adnexal masses but tenderness in the right upper quadrant and none on the left.  RECTAL:                       Exam unremarkable.  ASSESSMENT:                   [age]-year-old gravida 2, para 2, with persistent chronic right lower quadrant pain, is scheduled to undergo diagnostic laparoscopy to rule out the possibility of underlying pelvic adhesions and/or endometriosis.  Patient with prior history in the past of pelvic inflammatory disease (Chlamydia), treated, possibility that her symptoms may be attributed perhaps to pelvic adhesions.  With the use of the laser, we will try to free any adhesions that become evident or treat any endometriosis.  Patient had previously been provided with literature information with the [employer] of Obstetricians and Gynecologists outlining risks, benefits, pros and cons of laparoscopic surgery.  Patient would like to go ahead and proceed with this and if nothing is found on her diagnostic laparoscopy, she will then resort then for followup consultation with a gastroenterologist.  In the event of any technical difficulty, patient is fully aware that we may  need to approach the abdominal cavity with an abdominal incision which will require for her a larger abdominal incision which will require her to stay in the hospital for a few days.  Potential complications are trauma to internal organs from the laparoscope or from the laser surgery requiring open laparotomy and corrective surgery, infection, although she could potentially receive prophylaxis antibiotic.  The risks for deep venous thrombosis and pulmonary embolism and also in the event of uncontrollable hemorrhage, the need for blood and blood products, the risks of anaphylactic reaction, hepatitis and acquired immunodeficiency syndrome.  All these issues were discussed with the patient, she would like to proceed with such procedure, all questions are answered and we will follow accordingly.  PLAN:                         Patient is scheduled to undergo diagnostic laparoscopy on Monday, September 30th, at 1 p.m. at [facility].  Please have history and physical available. Dictated by:   Gaetano Hawthorne [name], M.D. Attending:  Gaetano Hawthorne. [name], M.D. DD:  06/19/01 TD:  06/20/01 Job: [id number]

## 2011-02-09 NOTE — Op Note (Signed)
NAME:  Lisa Cole, Lisa Cole                          ACCOUNT NO.:  [id number]   MEDICAL RECORD NO.:  [id number]                   PATIENT TYPE:   LOCATION:                                       FACILITY:   PHYSICIAN:  Charles A. Clearance Coots, M.D.             DATE OF BIRTH:   DATE OF PROCEDURE:  06/26/2003  DATE OF DISCHARGE:                                 OPERATIVE REPORT   PREOPERATIVE DIAGNOSIS:  Desires sterilization.   POSTOPERATIVE DIAGNOSIS:  Desires sterilization.   PROCEDURE:  Bilateral partial salpingectomy (Pomeroy technique).   SURGEON:  Charles A. Clearance Coots, M.D.   ANESTHESIA:  General.   ESTIMATED BLOOD LOSS:  Negligible.   COMPLICATIONS:  None.   SPECIMENS:  Approximately 2 cm segments of right and left fallopian tubes.   PROCEDURE:  The patient was brought to the operating room and after  satisfactory general endotracheal anesthesia the abdomen was prepped and  draped in the usual sterile fashion.  A small inferior umbilical incision  was made with the scalpel and was deepened down to the fascia with curved  Mayo scissors.  The fascia was grasped in the midline with Allis forceps and  was cut transversely with curved Mayo scissors down through the peritoneum.  The fascial incision was extended to the left and to the right with the  curved Mayo scissors.  The right angle retractors were placed in the  incision and the right fallopian tube was identified and grasped with a  Babcock clamp.  The tube was followed from the cornual end to the fimbrial  end serially and grasped with Babcock clamps and then was followed  retrograde back to the isthmic area of the tube serially and then grasped  with Babcock clamps.  A knuckle of tube beneath the Babcock clamp and the  isthmic portion of the tube was doubly ligated with #1 plain catgut and the  section of the tube above the knot was excised with Metzenbaum scissors and  submitted to pathology for evaluation.  There was no active  bleeding from  the tubal stumps and they were placed back in their normal anatomic  position.  The same procedure was performed on the opposite side without  complications.  The abdomen was then closed as follows.  The peritoneum and  fascia were closed as one with a continuous suture of 2-0 Vicryl.  The skin  was closed with a continuous subcuticular suture of 3-0 Monocryl.  Sterile  bandages were applied to the incision closure.  The surgical technician  indicated that all needle, sponge and instrument counts were correct.  The  patient tolerated the procedure well and was transported to the recovery  room in satisfactory condition.  Charles A. Clearance Coots, M.D.    CAH/MEDQ  D:  06/26/2003  T:  06/27/2003  Job:  [id number]

## 2011-02-09 NOTE — Discharge Summary (Signed)
NAMEMOLLI, GETHERS              ACCOUNT NO.:  [id number]   MEDICAL RECORD NO.:  [id number]          PATIENT TYPE:  INP   LOCATION:  9315                          FACILITY:  [facility]   PHYSICIAN:  Michelle L. Grewal, M.D.DATE OF BIRTH:  [birth year]   DATE OF ADMISSION:  12/10/2006  DATE OF DISCHARGE:  12/12/2006                               DISCHARGE SUMMARY   PREOPERATIVE DIAGNOSES:  1. Pelvic pain.  2. Ovarian cyst.  3. Genuine stress urinary incontinence.   POSTOPERATIVE DIAGNOSES:  1. Pelvic pain.  2. Ovarian cyst.  3. Genuine stress urinary incontinence.   HOSPITAL COURSE:  The patient is a 30 year old, G4, P3, who has  complained of pelvic pain, menorrhea and genuine stress urinary  incontinence. She has had a tubal ligation. On the day of surgery on  admission, she underwent a TAH/BSO and Burch retropubic urethropexy. EBL  was 300 mL. She did very well intraoperatively. Postoperatively, the  patient did very well. She was ambulating, voiding, had good pain  control. On postoperative day #2, she was having flatus. She had  remained afebrile with stable vital signs. She was discharged home in  good condition on postoperative day #2. She was given ibuprofen 600 mg  every 6 hours to take as needed for pain, Tylox 1 to 2 every 4 to 6 as  needed for pain, Estraderm patch 0.1 mg to use weekly for hormone  therapy. She will follow up on Tuesday to have her staples removed. She  was advised no driving for 2 weeks, no sex for 6 weeks, to call if she  has a temperature greater than 100.5, or if she develops any redness or  drainage from incision site or any nausea, vomiting or severe abdominal  pain.      Michelle L. Vincente Poli, M.D.  Electronically Signed     MLG/MEDQ  D:  01/09/2007  T:  01/09/2007  Job:  [id number]

## 2011-02-09 NOTE — Discharge Summary (Signed)
[facility]  Patient:    Lisa Cole, Lisa Cole                     MRN: [id number] Adm. Date:  [id number] Disc. Date: [id number] Attending:  Merrily Pew Dictator:   Antony Contras, [facility]                           Discharge Summary  DISCHARGE DIAGNOSIS:          Intrauterine pregnancy at term, prodromal labor.  PROCEDURES:                   Precipitous delivery of a viable infant secondary to midline laceration.  HISTORY OF PRESENT ILLNESS:   The patient is a 30 year old gravida 2, para 1, 00 1 with a questionable LMP, EDC Jan 30, 2000 by ultrasound.  Prenatal risk factors include history of glucose intolerance with her last pregnancy, past history of CIN-1.  This is a second pregnancy in less than a year.  She has history of anemia.  History of chlamydia and Trichomonas in the past and HPV on Paps.  Labs as follows:  Blood type A+, antibody screen negative, rubella was positive, RPR, HBsAg nonreactive.  MSAFP is normal.  GBS is negative.  HOSPITAL COURSE AND TREATMENT:  The patient was admitted on Feb 04, 2000 in prodromal labor.  Cervix was 3 cm, 80%, and -2.  Artificial ruptured membranes revealed clear fluid.  The patient did receive some pain medication at 5 cm. Dr. Audie Box did arrive at the hospital and was awaiting delivery in the doctors lounge when he was notified that the patient precipitously delivered in the bed with Dr. Maxie Better in attendance.  Infants birth weight was 6 pounds 3 ounces.  APGAR 8-9.  Second-degree midline laceration was repaired.  Postpartum course was normal.  She remained afebrile.  No difficulty voiding.  She was able to be discharged on her second postpartum day.  LABORATORY DATA:              Hematocrit 26.2, hemoglobin 8.8, WBC 6.6, platelets 154.  DISPOSITION:                  Follow up in the office in four to six weeks.  DISCHARGE MEDICATIONS:        Continue prenatal vitamins and iron.  Motrin  and Tylox for pain. DD:  02/23/00 TD:  02/27/00 Job: [id number]

## 2011-02-09 NOTE — Op Note (Signed)
[facility] [place]  Patient:    Lisa Cole, WALL Visit Number: [id number] MRN: [id number]          Service Type: DSU Location: [facility] Attending Physician:  Tonye Royalty Dictated by:   Gaetano Hawthorne. [name], M.D. Proc. Date: 06/23/01 Admit Date:  06/23/2001                             Operative Report  INCOMPLETE REPORT  INDICATION FOR OPERATION:     A 30 year old, gravida 2, para 2, with chronic right lower quadrant pain, normal ultrasound recently.  PREOPERATIVE DIAGNOSIS:       Chronic right lower quadrant pain.  POSTOPERATIVE DIAGNOSES:      1. Chronic right lower quadrant pain.                               2. Left ovarian functional cyst.                               3. Peritoneal implant.  ANESTHESIA:                   General endotracheal anesthesia.  SURGEON:                      Juan H. [name], M.D.  PROCEDURES PERFORMED:         1. Diagnostic laparoscopy.                               2. Excision of right cul-de-sac peritoneal                                  lesion.                               3. Aspiration of left functional ovarian cyst.  FINDINGS:                     (1) Right cul-de-sac peritoneal lesion. (2) Left functional clear cyst without excrescences, 2 cm in size. (3) Normal tubes with lush fimbriated end. Otherwise, normal pelvic cavity, normal gross inspection of the appendix, smooth liver surface and gallbladder.  DESCRIPTION OF PROCEDURE:     After the patient was adequately counseled, she was taken to the operating room where she underwent a successful general endotracheal anesthesia. She was placed in the low lithotomy position. The abdomen and vagina was prepped and draped in the usual sterile fashion. The bladder was evacuated of its contents. Dictated by:   Gaetano Hawthorne [name], M.D. Attending Physician:  Tonye Royalty DD:  06/23/01 TD:  06/23/01 Job: [id number]

## 2011-02-09 NOTE — H&P (Signed)
NAME:  Lisa Cole, Lisa Cole              ACCOUNT NO.:  [id number]   MEDICAL RECORD NO.:  [id number]          PATIENT TYPE:  AMB   LOCATION:  SDC                           FACILITY:  [facility]   PHYSICIAN:  Michelle L. Grewal, M.D.DATE OF BIRTH:  [birth year]   DATE OF ADMISSION:  12/10/2006  DATE OF DISCHARGE:                              HISTORY & PHYSICAL   CHIEF COMPLAINT:  Abdominal pain, irregular bleeding with clots, and  leakage of urine with sneeze and cough.   HISTORY OF PRESENT ILLNESS:  A 30 year old G83, P3 presents today  preoperatively for TAH/BSO and Burch retropubic urethropexy.  The  patient originally presented to me over a year ago complaining of lower  quadrant pain daily.  Her periods heavy, lasting 3-7 days and irregular.  She has a history of endometriosis that was diagnosed by exploratory  laparotomy and myomectomy in 2000.  This was performed by Dr. [name].  She had an ultrasound on February 12th that showed no uterine masses.  She had polycystic ovaries and a small amount of fluid in the cul de  sac.  Of note, the patient had a tubal ligation in 2004.  She also has a  history of PCOS and is currently on Metformin for diabetes.  She is on  1000 mg b.i.d.   Preoperatively, the patient underwent urodynamics testing, which  revealed genuine stress urinary incontinence.  The pain is almost every  day.  It is in the lower abdomen, and it is excruciating at times.  She  has made some visits to the emergency room because of the pain.  She  also complains of nocturia.   She is allergic to PENICILLIN and AMOXICILLIN.   Medical history is significant for diabetes, treated with Metformin and  diet.  IBS, yeast infections, migraines, anemia.   REVIEW OF SYSTEMS:  Unremarkable.   FAMILY HISTORY:  Significant for diabetes, lung cancer, hypertension,  IBS, hyperthyroidism.   PHYSICAL EXAMINATION:  VITAL SIGNS:  Patient is afebrile with stable  vital signs.  GENERAL:  Alert  and oriented in no apparent distress.  LUNGS:  Clear to auscultation bilaterally.  CARDIAC:  Regular rate and rhythm.  BREASTS:  Within normal limits.  ABDOMEN:  Soft.  She has diffuse tenderness everywhere.  PELVIC:  External genitalia within normal limits.  Vagina normal.  No  cystocele seen in cervix.  No lesions.  Uterus is anteverted, slightly  enlarged, and tender with movement.  Bilateral adnexa are tender.   IMPRESSION:  Pelvic pain, stress urinary incontinence, history of  myomectomy, ovarian cysts, and menometrorrhagia.   PLAN:  Recommend TAH/BSO, Burch retropubic urethropexy.  Patient was  counseled and acknowledges and accepted the risks of surgery, which  include anesthesia, risks of injury to abdominal internal organs, risks  of bladder injury requiring repair, risks of infection, risks of  pulmonary embolism, risk of venous thromboembolism.  Will proceed with  surgery.      Michelle L. Vincente Poli, M.D.  Electronically Signed     MLG/MEDQ  D:  12/09/2006  T:  12/10/2006  Job:  [id number]

## 2011-02-09 NOTE — Op Note (Signed)
[facility]  Patient:    Lisa Cole, Lisa Cole Visit Number: [id number] MRN: [id number]          Service Type: DSU Location: [facility] Attending Physician:  Tonye Royalty Dictated by:   Gaetano Hawthorne. [name], M.D. Proc. Date: 06/23/01 Admit Date:  06/23/2001                             Operative Report  INDICATION FOR OPERATION:     A 30 year old, gravida 2 para 2, with chronic right lower quadrant pain.  PREOPERATIVE DIAGNOSIS:       Chronic right lower quadrant pain.  POSTOPERATIVE DIAGNOSES:      1. Chronic right lower quadrant pain.                               2. Left functional cyst.  PROCEDURE:                    1. Diagnostic laparoscopy.                               2. Excision right cul-de-sac peritoneal                                  lesion.                               3. Aspiration of left ovarian functional cyst.  SURGEON:                      Juan H. [name], M.D.  ANESTHESIA:                   General endotracheal.  FINDINGS:                     (1) Right cul-de-sac peritoneal lesion. (2) Left functional clear cyst without excrescences, 2 cm in size. (3) Normal tubes with lush fimbriated ends. (4) Normal gross appearance of the appendix and gallbladder and liver.  DESCRIPTION OF PROCEDURE:     After the patient was adequately counseled, she was taken to the operating room where she underwent a successful general endotracheal anesthesia. She was placed in a low lithotomy position and the abdomen, vagina, and perineum were prepped and draped in the usual sterile fashion. The bladder was evacuated of its contents with a red rubber Roxan Hockey for approximately 50 cc. Examination under anesthesia demonstrated an anteverted uterus and a Hulka tenaculum was placed for manipulation during the laparoscopic procedure. After the drapes were in place, a small stab incision was made underneath the umbilicus followed by insertion of  the Veress needle. The intra-abdominal pressure was recorded 5 mmHg. Approximately        3 liters of carbon dioxide were insufflated into the peritoneal cavity. Following this, the Veress needle was removed. A 10 mm trocar was inserted into the incision and the trocar was removed. Sleeve was left in place and operative laparoscope was inserted. A second puncture site was made under laparoscopic guidance, approximately 2 cm above the symphysis pubis with a 5 mm trocar. A systematic inspection of the pelvic cavity demonstrated a peritoneal lesion deep in  the cul-de-sac near the right uterosacral ligament area which was placed under tension and a cheesy-like material was expulsed and this peritoneal lesion was excised with a bipolar scissors and passed off the operative field for histologic evaluation. A gentle cauterization of that area for hemostasis was accomplished. The left ovary had demonstrated an approximately 2 cm clear functional cyst which was aspirated clear fluid and was submitted for cytologic evaluation. The pelvic cavity was then copiously irrigated with normal saline solution. No other abnormalities were noted. The gas was removed from the abdominal cavity. The subumbilical incision fascia was closed with 3-0 Vicryl suture and the skin was reapproximated with _______ glue. Marcaine 0.25% was infiltrated in both incision sites for approximately 10 cc for postoperative analgesia. The Hulka tenaculum was removed. The patient was awakened and transferred to recovery room with stable vital signs. Blood loss was minimal. Fluid resuscitation consisted of 1700 cc of lactated Ringers, and she received 30 mg of Toradol in route to the recovery room. Pictures were obtained with duplicate copies, a set to be kept at [facility], a second set to [facility] office Dictated by:   Gaetano Hawthorne. [name], M.D. Attending Physician:  Tonye Royalty DD:   06/23/01 TD:  06/23/01 Job: [id number]

## 2011-02-09 NOTE — Discharge Summary (Signed)
NAME:  Lisa Cole, Lisa Cole                        ACCOUNT NO.:  [id number]   MEDICAL RECORD NO.:  [id number]                   PATIENT TYPE:  INP   LOCATION:  9148                                 FACILITY:  [facility]   PHYSICIAN:  Charles A. Clearance Coots, M.D.             DATE OF BIRTH:  Jan 27, 1981   DATE OF ADMISSION:  06/25/2003  DATE OF DISCHARGE:  06/28/2003                                 DISCHARGE SUMMARY   ADMITTING DIAGNOSES:  1. Thirty-eight weeks gestation.  2. Early prodromal labor.  3. Resistant group B streptococcus.   DISCHARGE DIAGNOSES:  1. Thirty-eight weeks gestation.  2. Early prodromal labor.  3. Resistant group B streptococcus.  4. Status post augmentation of labor and group B streptococcus prophylaxis.  5. Normal spontaneous vaginal delivery of viable female fetus on June 26, 2003 at 0223; Apgars of 9 at one minute and 9 at five minutes; weight of     3060 grams, length of 50 cm.  The patient was desirous of permanent     sterilization and was taken to the operating room on June 26, 2003 for     bilateral partial salpingectomy which was done without complications.     Mother and infant discharged home in good condition.   REASON FOR ADMISSION:  A 30 year old black female G4 P2 presented at [redacted]  weeks gestation with uterine contractions.  The patient had a history of  resistant group B strep that was sensitive to vancomycin and the plan was to  bring the patient in for induction of labor with group B strep prophylaxis  done prior to delivery.   PAST MEDICAL HISTORY:  1. Surgery:  Laparoscopy in 2002, D&C in 2001 for retained placenta.  2. Illnesses:  Urolithiasis.   MEDICATIONS:  Prenatal vitamins, Valtrex.   ALLERGIES:  PENICILLIN.   SOCIAL HISTORY:  Single.  Negative history of tobacco, alcohol, or  recreational drug use.   FAMILY HISTORY:  Heart disease, hypertension, stroke, diabetes.   PRENATAL LABORATORY DATA:  Rubella immune.  Hepatitis B surface  antigen  negative.  Syphilis nonreactive.  HIV negative.  GC and chlamydia negative.  Group B strep positive.   PHYSICAL EXAMINATION:  GENERAL:  Well-nourished, well-developed black female  in no acute distress.  VITAL SIGNS:  She is afebrile and vital signs are stable.  HEENT:  Normal.  HEART:  Regular rate and rhythm.  LUNGS:  Clear to auscultation bilaterally.  ABDOMEN:  Gravid, soft, nontender.  PELVIC:  Cervix 4 cm dilated, 70% effaced, and the vertex at a -3 station.  EXTREMITIES:  Without cyanosis, clubbing, or edema bilaterally.  Deep tendon  reflexes were 2+ bilaterally.  FETAL MONITOR:  Fetal heart tones are 140 to 150 beats per minute and the  tracing was reactive.   ADMITTING LABORATORY VALUES:  Hemoglobin 10.8; hematocrit 31.4; white blood  cell count 6200; platelets 151,000.  ABO/Rh was A  positive with negative  antibodies.   HOSPITAL COURSE:  The patient was admitted for augmentation of labor and was  started on vancomycin prior to induction of labor and had severe itching  with vancomycin and this was discontinued.  She was given ciprofloxacin p.o.  and Pitocin IV was started per protocol approximately six hours after the  last dose of ciprofloxacin.  The patient rapidly progressed in labor and had  normal spontaneous vaginal delivery of a viable female without complications.  She was taken to the operating room the same day for a tubal sterilization  and underwent a bilateral partial salpingectomy without complications.  The  remainder of the postpartum and postoperative course was uncomplicated and  the patient was discharged home on postpartum day #2 in good condition.   DISCHARGE LABORATORY VALUES:  Hemoglobin 9.5; hematocrit 27.6; white blood  cell count 9400; platelets 155,000.   DISCHARGE DISPOSITION:  1. Medications:     a. Ibuprofen 800 mg p.o. q.8h. as needed for pain.     b. Darvocet-N 100 two tablets p.o. q.4-6h. as needed for pain.     c. Continue  prenatal vitamins.  2. Routine written obstetrical discharge orders were given.  3. The patient is to follow up at the [facility] in two weeks.                                               Charles A. Clearance Coots, M.D.    CAH/MEDQ  D:  06/28/2003  T:  06/28/2003  Job:  [id number]

## 2011-02-09 NOTE — Op Note (Signed)
NAME:  Lisa Cole, Lisa Cole              ACCOUNT NO.:  [id number]   MEDICAL RECORD NO.:  [id number]          PATIENT TYPE:  AMB   LOCATION:  SDC                           [facility]:  [facility]   PHYSICIAN:  Michelle L. Grewal, M.D.DATE OF BIRTH:  17-Dec-1980   DATE OF PROCEDURE:  12/10/2006  DATE OF DISCHARGE:                               OPERATIVE REPORT   PREOPERATIVE DIAGNOSES:  1. Pelvic pain.  2. Ovarian cyst.  3. Genuine stress urinary incontinence.   POSTOPERATIVE DIAGNOSES:  1. Pelvic pain.  2. Ovarian cyst.  3. Genuine stress urinary incontinence.   PROCEDURE:  Total abdominal hysterectomy, bilateral salpingo-  oophorectomy and Burch retropubic urethropexy.   SURGEON:  Dr. Vincente Poli.   ASSISTANT:  Dr. Jennette Kettle.   ANESTHESIA:  General.   SPECIMENS:  Uterus, tubes, cervix and ovaries.   ESTIMATED BLOOD LOSS:  300 mL.   COMPLICATIONS:  None.   DRAINS:  Foley.   PROCEDURE:  Patient was taken to the operating room.  She was intubated  without difficulty.  She was prepped and draped in the usual sterile  fashion.  A three-way Foley was inserted.  Sterile drape was applied.  A  low transverse incision was made, carried down to the fascia, fascia  scored in the midline and extended laterally.  The rectus muscles were  separated in the midline.  The peritoneum was entered bluntly.  The  peritoneal incision was then stretched.  The self-retaining retractor  was placed in the abdominal cavity.  The large and small bowel were  placed in the upper abdomen.  The lower abdomen and pelvis was  inspected.  The uterus was appeared to be normal.  She did have some  adhesions around both fallopian tubes, and her ovaries were consistent  with polycystic ovaries.  We then grasped each triple pedicle with a  Kelly clamp.  The round ligament was identified on the right side.  It  was suture ligated using 0 Vicryl suture.  The anterior leaf of the  broad ligament was incised, and the bladder flap was  created on the  right side.  I then developed an avascular window just beneath the ovary  beneath the IP ligament with careful attention to stay just beside the  ovary.  A curved Heaney clamp was placed across the IP ligament just  below the ovary.  With careful attention, the ureter was well below our  clamp.  The pedicle was suture ligated using 0 Vicryl suture and secured  using a free tie of 0 Vicryl suture.  We then clamped the uterine  arteries at the level of the internal os.  With a curved Heaney clamp,  the specimen was suture ligated using 0 Vicryl suture.  This was done on  the left side of the pelvis in a similar fashion with excellent  hemostasis.  Once we had clamped both uterine arteries, then the bladder  flap was created further.  Straight Heaney clamps were placed just  beside the uterosacral ligaments on either side.  Each pedicle was  suture ligated using 0 Vicryl suture.  We walked our  way down to stand  just beside the cervix and then finally reached the external os where  curved Heaney clamps were placed just beneath the specimen.  The  specimen was removed.  The angle stitches were then placed using 0  Vicryl suture.  The remainder of the cuff was closed with three figure-  of-eight using 0 Vicryl suture.  Irrigation was performed.  Hemostasis  was excellent.  All instruments and laparotomy pads were removed from  the abdominal cavity.  The peritoneum was closed using 0  Vicryl.  At  this point, we proceeded with a Burch retropubic urethropexy, with one  hand in the vagina with tension on the Foley bulb, I then developed a  plane in the space of Retzius on either side and identified the Cooper's  ligament which was well developed on both sides.  Using Ethibond suture,  I then placed 1 figure-of-eight 1 cm lateral to the UV junction on  either side and then transfixed each to the Cooper's ligament.  Methylene blue was inserted.  We had excellent elevation of the  bladder  neck.  The stitches were then tied down.  There was no evidence of any  blue leakage from our stitches.  I changed my gloves, and then we closed  the fascia using 0 Vicryl running stitch.  After irrigation of the  subcutaneous layer, the skin was closed with staples.  All sponge, lap  and instrument counts were correct x2.  The patient went to recovery  room extubated in stable condition.      Michelle L. Vincente Poli, M.D.  Electronically Signed     MLG/MEDQ  D:  12/10/2006  T:  12/10/2006  Job:  [id number]

## 2011-02-09 NOTE — H&P (Signed)
[facility]  Patient:    MEHGAN, SANTMYER                     MRN: [id number] Adm. Date:  [id number] Attending:  Merrily Pew                         History and Physical  CHIEF COMPLAINT:                  Prodromal labor.  HISTORY OF PRESENT ILLNESS:       A 30 year old, G2, P14 female at [redacted] weeks gestation admitted with contractions every 3 to 6 minutes at 3 cm, 70%, -2 station.  The patient remained having irregular contractions throughout the evening of admission and this morning was found to be unchanged on exam.  We discussed options, and will proceed with Pitocin augmentation and delivery.  PAST MEDICAL HISTORY:             Uncomplicated.  PAST SURGICAL HISTORY:            None.  PAST OBSTETRICAL HISTORY:         Vaginal delivery in 1999.  ALLERGIES:                        PENICILLIN.  CURRENT MEDICATIONS:              Prenatal vitamins.  FAMILY HISTORY:                   Noncontributory.  REVIEW OF SYSTEMS:                Noncontributory.  ADMISSION PHYSICAL EXAMINATION:  VITAL SIGNS:                      Afebrile.  Vital signs are stable.  HEENT:                            Normal.  LUNGS:                            Clear.  CARDIAC:                          Regular rate without rubs, murmurs, or gallops.  ABDOMEN:                          Gravid vertex fetus, reactive fetal tracing.  Contractions every 2 to 4 minutes.  PELVIC:                           At 80%, 3 cm, -2 station, artificial rupture of membranes, fluid clear.  ASSESSMENT:                       A 30 year old G75, P34 female, [redacted] weeks gestation, prodromal labor for augmentation and delivery.  Beta Streptococcus is negative. DD:  02/04/00 TD:  02/04/00 Job: [id number]

## 2011-02-14 ENCOUNTER — Institutional Professional Consult (permissible substitution): Payer: BC Managed Care – PPO | Admitting: Pulmonary Disease

## 2011-02-21 ENCOUNTER — Encounter: Payer: Self-pay | Admitting: Gastroenterology

## 2011-03-01 ENCOUNTER — Encounter: Payer: Self-pay | Admitting: Pulmonary Disease

## 2011-03-02 ENCOUNTER — Ambulatory Visit (INDEPENDENT_AMBULATORY_CARE_PROVIDER_SITE_OTHER): Payer: BC Managed Care – PPO | Admitting: Pulmonary Disease

## 2011-03-02 ENCOUNTER — Encounter: Payer: Self-pay | Admitting: Pulmonary Disease

## 2011-03-02 VITALS — BP 102/76 | HR 114 | Temp 99.0°F | Ht 60.0 in | Wt 202.8 lb

## 2011-03-02 DIAGNOSIS — R059 Cough, unspecified: Secondary | ICD-10-CM

## 2011-03-02 DIAGNOSIS — R05 Cough: Secondary | ICD-10-CM | POA: Insufficient documentation

## 2011-03-02 NOTE — Assessment & Plan Note (Signed)
She has cough at night associated with choking sensation, snoring, and sleep disruption.  She has recent sleep test showing sleep apnea, and is due to have titration study.  She is being followed by Dr. Sidney Ace for possible asthma.  She has elevated ESR and CRP on recent blood tests, but no other findings to suggest an interstitial lung disease.  Her chest xray from [facility] 2012 was normal.  I have advised her that no additional pulmonary tests were needed at this time, and that she can follow up with pulmonary medicine as needed.

## 2011-03-02 NOTE — Progress Notes (Signed)
Subjective:    Patient ID: Lisa Cole, female    DOB: Lisa Cole 17, 1982, 30 y.o.   MRN: [id number]  HPI CC: [name], [name] 30 yo female never smoker with cough.  She has a cough at night associated with strangle feeling, and sleep disruption.  She had recent sleep test and was found to have mild sleep apnea.  She is being set up for CPAP.  She had recent blood test which showed elevated ESR and CRP.  She was told because of this she needed to see a lung doctor to rule out a pulmonary cause for these.  Her chest xray from [facility] 2012 was normal.  She is followed by Dr. Sidney Ace for possible asthma.  She has been tried on inhalers for her lungs and sinuses, but is not sure these have helped much.    She has a cough with clear sputum in the morning, but usually not otherwise.  She denies chest tightness, wheeze, hemoptysis, or fever.  She has problems with arthritis and has been gaining weight.  She is not very active.  She denies sinus congestion.  She has reflux, and is due to see Dr. Rob Bunting with GI.  PSG from 01/12/11>>AHI 12.8  Past Medical History  Diagnosis Date  . Endometriosis   . Type 2 diabetes mellitus   . Anemia   . BV (bacterial vaginosis)   . Hepatic steatosis   . Anxiety disorder   . Arthritis   . Asthma   . IBS (irritable bowel syndrome)   . Obesity   . Chronic pelvic pain in female   . Hashimoto thyroiditis   . OSA (obstructive sleep apnea)      Family History  Problem Relation Age of Onset  . Alcohol abuse    . Arthritis Maternal Grandmother   . Diabetes Daughter     type 2  . Hypertension    . Hyperlipidemia    . Kidney disease    . Colon cancer      uncle  . Prostate cancer      uncles and grandfather  . Cirrhosis      both grandmother and grandfather  . Lung cancer       History   Social History  . Marital Status: Divorced    Spouse Name: N/A    Number of Children: N/A  .  Years of Education: N/A   Occupational History  . [employer]    Social History Main Topics  . Smoking status: Never Smoker   . Smokeless tobacco: Never Used  . Alcohol Use: No  . Drug Use: No  . Sexually Active: Not on file   Other Topics Concern  . Not on file   Social History Narrative  . No narrative on file     Allergies  Allergen Reactions  . Amoxicillin   . Penicillins      Outpatient Prescriptions Prior to Visit  Medication Sig Dispense Refill  . diazepam (VALIUM) 5 MG tablet Take 5 mg by mouth at bedtime as needed.        Marland Kitchen HYDROcodone-acetaminophen (VICODIN) 5-500 MG per tablet Take 1 tablet by mouth every 6 (six) hours as needed.        . lubiprostone (AMITIZA) 8 MCG capsule Take 8 mcg by mouth daily with breakfast.        . methocarbamol (ROBAXIN) 500 MG tablet Take 500 mg by mouth every 6 (six) hours as needed.        Marland Kitchen  URELLE (URELLE/URISED) 81 MG TABS Take 1 tablet by mouth every 6 (six) hours as needed.        Marland Kitchen estradiol (CLIMARA - DOSED IN MG/24 HR) 0.0375 mg/24hr Place 1 patch onto the skin once a week.        . estradiol (VIVELLE-DOT) 0.05 MG/24HR Place 1 patch onto the skin once a week.          Review of Systems  Constitutional: Positive for activity change and unexpected weight change.  HENT: Positive for trouble swallowing.   Respiratory: Positive for cough and shortness of breath.   Cardiovascular: Positive for palpitations.  Gastrointestinal: Positive for abdominal pain.       Heartburn indigestion  Musculoskeletal: Positive for joint swelling.  Neurological: Positive for headaches.       Objective:   Physical Exam BP 102/76  Pulse 114  Temp(Src) 99 F (37.2 C) (Oral)  Ht 5' (1.524 m)  Wt 202 lb 12.8 oz (91.989 kg)  BMI 39.61 kg/m2  SpO2 96%  General - Obese HEENT - PERRLA, EOMI, no sinus tenderness, MP 4, enlarged tongue, no oral exudate, no LAN Cardiac - s1s2 regular, no murmur Chest - CTA Abd - obese, soft, nontender Ext - no  e/c/c Neuro - normal strength, CN intact Psych - normal mood/behavior Skin - no rash      CHEST - 2 VIEW 01/09/11:    Comparison: None.    Findings: Cardiac and mediastinal contours appear normal.    The lungs appear clear.    No pleural effusion is identified.    IMPRESSION:    No significant abnormality identified.  Assessment & Plan:   Cough She has cough at night associated with choking sensation, snoring, and sleep disruption.  She has recent sleep test showing sleep apnea, and is due to have titration study.  She is being followed by Dr. Sidney Ace for possible asthma.  She has elevated ESR and CRP on recent blood tests, but no other findings to suggest an interstitial lung disease.  Her chest xray from [facility] 2012 was normal.  I have advised her that no additional pulmonary tests were needed at this time, and that she can follow up with pulmonary medicine as needed.    Updated Medication List Outpatient Encounter Prescriptions as of 03/02/2011  Medication Sig Dispense Refill  . diazepam (VALIUM) 5 MG tablet Take 5 mg by mouth at bedtime as needed.        Marland Kitchen HYDROcodone-acetaminophen (VICODIN) 5-500 MG per tablet Take 1 tablet by mouth every 6 (six) hours as needed.        . lubiprostone (AMITIZA) 8 MCG capsule Take 8 mcg by mouth daily with breakfast.        . methocarbamol (ROBAXIN) 500 MG tablet Take 500 mg by mouth every 6 (six) hours as needed.        . pentosan polysulfate (ELMIRON) 100 MG capsule Take 100 mg by mouth 2 (two) times daily.        Marland Kitchen URELLE (URELLE/URISED) 81 MG TABS Take 1 tablet by mouth every 6 (six) hours as needed.        Marland Kitchen DISCONTD: estradiol (CLIMARA - DOSED IN MG/24 HR) 0.0375 mg/24hr Place 1 patch onto the skin once a week.        Marland Kitchen DISCONTD: estradiol (VIVELLE-DOT) 0.05 MG/24HR Place 1 patch onto the skin once a week.

## 2011-03-02 NOTE — Patient Instructions (Signed)
Follow up with pulmonary as needed 

## 2011-03-13 ENCOUNTER — Ambulatory Visit: Payer: BC Managed Care – PPO | Admitting: Internal Medicine

## 2011-03-13 ENCOUNTER — Ambulatory Visit: Payer: BC Managed Care – PPO | Admitting: Rehabilitation

## 2011-03-20 ENCOUNTER — Ambulatory Visit: Payer: BC Managed Care – PPO | Attending: Family Medicine | Admitting: Physical Therapy

## 2011-03-20 DIAGNOSIS — M25559 Pain in unspecified hip: Secondary | ICD-10-CM | POA: Insufficient documentation

## 2011-03-20 DIAGNOSIS — M542 Cervicalgia: Secondary | ICD-10-CM | POA: Insufficient documentation

## 2011-03-20 DIAGNOSIS — IMO0001 Reserved for inherently not codable concepts without codable children: Secondary | ICD-10-CM | POA: Insufficient documentation

## 2011-03-20 DIAGNOSIS — M545 Low back pain, unspecified: Secondary | ICD-10-CM | POA: Insufficient documentation

## 2011-03-27 ENCOUNTER — Encounter: Payer: BC Managed Care – PPO | Admitting: Physical Therapy

## 2011-04-03 ENCOUNTER — Ambulatory Visit: Payer: BC Managed Care – PPO | Admitting: Gastroenterology

## 2011-04-07 ENCOUNTER — Emergency Department (HOSPITAL_COMMUNITY)
Admission: EM | Admit: 2011-04-07 | Discharge: 2011-04-07 | Disposition: A | Discharge status: Home / Self Care | Payer: BC Managed Care – PPO | Attending: Emergency Medicine | Admitting: Emergency Medicine

## 2011-04-07 DIAGNOSIS — M545 Low back pain, unspecified: Secondary | ICD-10-CM | POA: Insufficient documentation

## 2011-04-07 DIAGNOSIS — R269 Unspecified abnormalities of gait and mobility: Secondary | ICD-10-CM | POA: Insufficient documentation

## 2011-04-07 DIAGNOSIS — M549 Dorsalgia, unspecified: Secondary | ICD-10-CM | POA: Insufficient documentation

## 2011-04-07 DIAGNOSIS — E119 Type 2 diabetes mellitus without complications: Secondary | ICD-10-CM | POA: Insufficient documentation

## 2011-04-07 LAB — URINALYSIS, ROUTINE W REFLEX MICROSCOPIC
Glucose, UA: NEGATIVE mg/dL
Leukocytes, UA: NEGATIVE
Specific Gravity, Urine: 1.022 (ref 1.005–1.030)
pH: 6.5 (ref 5.0–8.0)

## 2011-04-18 ENCOUNTER — Other Ambulatory Visit: Payer: Self-pay | Admitting: Neurological Surgery

## 2011-04-18 DIAGNOSIS — E237 Disorder of pituitary gland, unspecified: Secondary | ICD-10-CM

## 2011-04-25 ENCOUNTER — Ambulatory Visit
Admission: RE | Admit: 2011-04-25 | Discharge: 2011-04-25 | Disposition: A | Discharge status: Home / Self Care | Payer: BC Managed Care – PPO | Source: Ambulatory Visit | Attending: Neurological Surgery | Admitting: Neurological Surgery

## 2011-04-25 DIAGNOSIS — E237 Disorder of pituitary gland, unspecified: Secondary | ICD-10-CM

## 2011-04-25 MED ORDER — GADOBENATE DIMEGLUMINE 529 MG/ML IV SOLN
10.0000 mL | Freq: Once | INTRAVENOUS | Status: AC | PRN
  Administered 2011-04-25: 10 mL via INTRAVENOUS

## 2011-04-26 ENCOUNTER — Emergency Department (HOSPITAL_COMMUNITY)
Admission: EM | Admit: 2011-04-26 | Discharge: 2011-04-26 | Disposition: A | Discharge status: Home / Self Care | Payer: Medicaid Other | Attending: Emergency Medicine | Admitting: Emergency Medicine

## 2011-04-26 DIAGNOSIS — R5381 Other malaise: Secondary | ICD-10-CM | POA: Insufficient documentation

## 2011-04-26 DIAGNOSIS — E669 Obesity, unspecified: Secondary | ICD-10-CM | POA: Insufficient documentation

## 2011-04-26 DIAGNOSIS — H81399 Other peripheral vertigo, unspecified ear: Secondary | ICD-10-CM | POA: Insufficient documentation

## 2011-04-26 DIAGNOSIS — R3915 Urgency of urination: Secondary | ICD-10-CM | POA: Insufficient documentation

## 2011-04-26 DIAGNOSIS — R350 Frequency of micturition: Secondary | ICD-10-CM | POA: Insufficient documentation

## 2011-04-26 DIAGNOSIS — R300 Dysuria: Secondary | ICD-10-CM | POA: Insufficient documentation

## 2011-04-26 DIAGNOSIS — H5500 Unspecified nystagmus: Secondary | ICD-10-CM | POA: Insufficient documentation

## 2011-04-26 DIAGNOSIS — Z79899 Other long term (current) drug therapy: Secondary | ICD-10-CM | POA: Insufficient documentation

## 2011-04-26 DIAGNOSIS — E119 Type 2 diabetes mellitus without complications: Secondary | ICD-10-CM | POA: Insufficient documentation

## 2011-04-26 DIAGNOSIS — R5383 Other fatigue: Secondary | ICD-10-CM | POA: Insufficient documentation

## 2011-04-26 LAB — URINALYSIS, ROUTINE W REFLEX MICROSCOPIC
Ketones, ur: NEGATIVE mg/dL
Leukocytes, UA: NEGATIVE
Nitrite: NEGATIVE
Protein, ur: NEGATIVE mg/dL

## 2011-04-26 LAB — GLUCOSE, CAPILLARY: Glucose-Capillary: 169 mg/dL — ABNORMAL HIGH (ref 70–99)

## 2011-04-26 LAB — POCT PREGNANCY, URINE: Preg Test, Ur: NEGATIVE

## 2011-05-07 ENCOUNTER — Ambulatory Visit: Payer: Medicaid Other | Admitting: Physical Therapy

## 2011-05-16 ENCOUNTER — Other Ambulatory Visit (HOSPITAL_COMMUNITY): Payer: Self-pay | Admitting: Obstetrics and Gynecology

## 2011-05-16 DIAGNOSIS — E041 Nontoxic single thyroid nodule: Secondary | ICD-10-CM

## 2011-05-17 DIAGNOSIS — E041 Nontoxic single thyroid nodule: Secondary | ICD-10-CM | POA: Insufficient documentation

## 2011-05-21 ENCOUNTER — Other Ambulatory Visit (HOSPITAL_COMMUNITY): Payer: Medicaid Other

## 2011-05-23 ENCOUNTER — Other Ambulatory Visit (HOSPITAL_COMMUNITY): Payer: Self-pay | Admitting: Obstetrics and Gynecology

## 2011-05-23 DIAGNOSIS — E041 Nontoxic single thyroid nodule: Secondary | ICD-10-CM

## 2011-05-24 ENCOUNTER — Ambulatory Visit (HOSPITAL_COMMUNITY)
Admission: RE | Admit: 2011-05-24 | Discharge: 2011-05-24 | Disposition: A | Discharge status: Home / Self Care | Payer: PPO | Source: Ambulatory Visit | Attending: Obstetrics and Gynecology | Admitting: Obstetrics and Gynecology

## 2011-05-24 ENCOUNTER — Other Ambulatory Visit (HOSPITAL_COMMUNITY): Payer: Medicaid Other

## 2011-05-24 DIAGNOSIS — E041 Nontoxic single thyroid nodule: Secondary | ICD-10-CM

## 2011-06-19 ENCOUNTER — Inpatient Hospital Stay (HOSPITAL_COMMUNITY)
Admission: AD | Admit: 2011-06-19 | Discharge: 2011-06-19 | Disposition: A | Discharge status: Home / Self Care | Payer: PPO | Source: Ambulatory Visit | Attending: Obstetrics and Gynecology | Admitting: Obstetrics and Gynecology

## 2011-06-19 ENCOUNTER — Inpatient Hospital Stay (HOSPITAL_COMMUNITY): Payer: PPO

## 2011-06-19 ENCOUNTER — Emergency Department (HOSPITAL_COMMUNITY)
Admission: EM | Admit: 2011-06-19 | Discharge: 2011-06-19 | Discharge status: Against Medical Advice | Payer: Medicaid Other | Attending: Emergency Medicine | Admitting: Emergency Medicine

## 2011-06-19 ENCOUNTER — Encounter (HOSPITAL_COMMUNITY): Payer: Self-pay | Admitting: *Deleted

## 2011-06-19 DIAGNOSIS — N949 Unspecified condition associated with female genital organs and menstrual cycle: Secondary | ICD-10-CM

## 2011-06-19 DIAGNOSIS — M549 Dorsalgia, unspecified: Secondary | ICD-10-CM | POA: Insufficient documentation

## 2011-06-19 DIAGNOSIS — G8929 Other chronic pain: Secondary | ICD-10-CM

## 2011-06-19 DIAGNOSIS — R1031 Right lower quadrant pain: Secondary | ICD-10-CM | POA: Insufficient documentation

## 2011-06-19 HISTORY — DX: Supraventricular tachycardia: I47.1

## 2011-06-19 HISTORY — DX: Supraventricular tachycardia, unspecified: I47.10

## 2011-06-19 LAB — CBC
Hemoglobin: 12.8 g/dL (ref 12.0–15.0)
MCH: 29.4 pg (ref 26.0–34.0)
MCV: 89.2 fL (ref 78.0–100.0)
RBC: 4.35 MIL/uL (ref 3.87–5.11)
WBC: 7.1 10*3/uL (ref 4.0–10.5)

## 2011-06-19 LAB — URINALYSIS, ROUTINE W REFLEX MICROSCOPIC
Ketones, ur: NEGATIVE mg/dL
Leukocytes, UA: NEGATIVE
Nitrite: NEGATIVE
Protein, ur: NEGATIVE mg/dL
pH: 5.5 (ref 5.0–8.0)

## 2011-06-19 LAB — COMPREHENSIVE METABOLIC PANEL
ALT: 27 U/L (ref 0–35)
CO2: 29 mEq/L (ref 19–32)
Calcium: 10.2 mg/dL (ref 8.4–10.5)
Chloride: 102 mEq/L (ref 96–112)
Creatinine, Ser: 0.64 mg/dL (ref 0.50–1.10)
GFR calc Af Amer: >60 mL/min (ref >60)
GFR calc non Af Amer: >60 mL/min (ref >60)
Glucose, Bld: 94 mg/dL (ref 70–99)
Total Bilirubin: 0.3 mg/dL (ref 0.3–1.2)

## 2011-06-19 MED ORDER — IOHEXOL 300 MG/ML  SOLN
100.0000 mL | Freq: Once | INTRAMUSCULAR | Status: AC | PRN
  Administered 2011-06-19: 100 mL via INTRAVENOUS

## 2011-06-19 MED ORDER — ONDANSETRON 4 MG PO TBDP
4.0000 mg | ORAL_TABLET | Freq: Once | ORAL | Status: AC
  Administered 2011-06-19: 4 mg via ORAL
  Filled 2011-06-19 (×2): qty 1

## 2011-06-19 MED ORDER — KETOROLAC TROMETHAMINE 60 MG/2ML IM SOLN
60.0000 mg | Freq: Once | INTRAMUSCULAR | Status: AC
  Administered 2011-06-19: 60 mg via INTRAMUSCULAR
  Filled 2011-06-19: qty 2

## 2011-06-19 NOTE — Progress Notes (Signed)
Pt in c/o lower back pain and rlq pain x2 weeks, worst today.  Reports "contraction-like" pains.  Was supposed to have CT scan of stomach, but was unable to be done due to insurance reasons.  Was seen at pain clinic today for recurrent neck, shoulder pain but was told to come over here today due to lower body pains.  Denies any bleeding or abnormal discharge.  Reports slight burning with urination.  Is being evaluated currently by cardiologist for Lupus, rheumatoid arthritis, and gout.

## 2011-06-19 NOTE — ED Provider Notes (Signed)
History   Pt presents today c/o severe RLQ pain that has worsened over the past 2 wks. She denies fever, vag dc, bleeding, or any other sx. She states she was scheduled for a CT scan last week but her insurance only covers CT scans for emergency situations. She also c/o some mild dysuria.  Chief Complaint  Patient presents with  . Back Pain  . Abdominal Pain   HPI  OB History    Grav Para Term Preterm Abortions TAB SAB Ect Mult Living   4 3 3  0 1 0 1 0 0 3      Past Medical History  Diagnosis Date  . Endometriosis   . Type 2 diabetes mellitus   . Anemia   . BV (bacterial vaginosis)   . Hepatic steatosis   . Arthritis   . Asthma   . IBS (irritable bowel syndrome)   . Obesity   . Chronic pelvic pain in female   . Hashimoto thyroiditis   . OSA (obstructive sleep apnea)   . Anxiety disorder   . SVT (supraventricular tachycardia)     Past Surgical History  Procedure Date  . Total abdominal hysterectomy 2004  . Tubal ligation 2001  . Shoulder surgery 2008    right  . Wrist surgery 2008    right  . Cystostomy w/ bladder biopsy   . Bladder staple     Family History  Problem Relation Age of Onset  . Alcohol abuse    . Arthritis Maternal Grandmother   . Diabetes Daughter     type 2  . Hypertension    . Hyperlipidemia    . Kidney disease    . Colon cancer      uncle  . Prostate cancer      uncles and grandfather  . Cirrhosis      both grandmother and grandfather  . Lung cancer    . Heart disease    . Thyroid disease    . Colon polyps    . Liver disease      History  Substance Use Topics  . Smoking status: Never Smoker   . Smokeless tobacco: Never Used  . Alcohol Use: No    Allergies:  Allergies  Allergen Reactions  . Amoxicillin   . Penicillins     Prescriptions prior to admission  Medication Sig Dispense Refill  . diazepam (VALIUM) 5 MG tablet Take 5 mg by mouth at bedtime as needed.        Marland Kitchen HYDROcodone-acetaminophen (VICODIN) 5-500 MG per  tablet Take 1 tablet by mouth every 6 (six) hours as needed.        . lubiprostone (AMITIZA) 8 MCG capsule Take 8 mcg by mouth daily with breakfast.        . methocarbamol (ROBAXIN) 500 MG tablet Take 500 mg by mouth every 6 (six) hours as needed.        . pentosan polysulfate (ELMIRON) 100 MG capsule Take 100 mg by mouth 2 (two) times daily.        Marland Kitchen URELLE (URELLE/URISED) 81 MG TABS Take 1 tablet by mouth every 6 (six) hours as needed.          Review of Systems  Constitutional: Negative for fever.  Cardiovascular: Negative for chest pain.  Gastrointestinal: Positive for abdominal pain. Negative for nausea, vomiting, diarrhea and constipation.  Genitourinary: Positive for dysuria. Negative for urgency, frequency and hematuria.  Neurological: Negative for dizziness and headaches.  Psychiatric/Behavioral: Negative for depression and  suicidal ideas.   Physical Exam   Blood pressure 125/72, pulse 90, temperature 99 F (37.2 C), temperature source Oral, resp. rate 18, height 5' (1.524 m), weight 200 lb (90.719 kg).  Physical Exam  Constitutional: She is oriented to person, place, and time. She appears well-developed and well-nourished. No distress.  HENT:  Head: Normocephalic and atraumatic.  Eyes: EOM are normal. Pupils are equal, round, and reactive to light.  GI: Soft. She exhibits no distension and no mass. There is tenderness. There is no rebound and no guarding.  Genitourinary: No bleeding around the vagina. Vaginal discharge found.       Vag cuff intact. Ovaries not palpated secondary to increased body habitus.   Neurological: She is alert and oriented to person, place, and time.  Skin: Skin is warm and dry. She is not diaphoretic.  Psychiatric: She has a normal mood and affect. Her behavior is normal. Judgment and thought content normal.    MAU Course  Procedures  Results for orders placed during the hospital encounter of 06/19/11 (from the past 24 hour(s))  URINALYSIS,  ROUTINE W REFLEX MICROSCOPIC     Status: Abnormal   Collection Time   06/19/11 12:09 PM      Component Value Range   Color, Urine YELLOW  YELLOW    Appearance CLEAR  CLEAR    Specific Gravity, Urine >1.030 (*) 1.005 - 1.030    pH 5.5  5.0 - 8.0    Glucose, UA NEGATIVE  NEGATIVE (mg/dL)   Hgb urine dipstick NEGATIVE  NEGATIVE    Bilirubin Urine NEGATIVE  NEGATIVE    Ketones, ur NEGATIVE  NEGATIVE (mg/dL)   Protein, ur NEGATIVE  NEGATIVE (mg/dL)   Urobilinogen, UA 0.2  0.0 - 1.0 (mg/dL)   Nitrite NEGATIVE  NEGATIVE    Leukocytes, UA NEGATIVE  NEGATIVE   POCT PREGNANCY, URINE     Status: Normal   Collection Time   06/19/11 12:15 PM      Component Value Range   Preg Test, Ur NEGATIVE    CBC     Status: Normal   Collection Time   06/19/11 12:17 PM      Component Value Range   WBC 7.1  4.0 - 10.5 (K/uL)   RBC 4.35  3.87 - 5.11 (MIL/uL)   Hemoglobin 12.8  12.0 - 15.0 (g/dL)   HCT 16.1  09.6 - 04.5 (%)   MCV 89.2  78.0 - 100.0 (fL)   MCH 29.4  26.0 - 34.0 (pg)   MCHC 33.0  30.0 - 36.0 (g/dL)   RDW 40.9  81.1 - 91.4 (%)   Platelets 239  150 - 400 (K/uL)  COMPREHENSIVE METABOLIC PANEL     Status: Normal   Collection Time   06/19/11  1:22 PM      Component Value Range   Sodium 139  135 - 145 (mEq/L)   Potassium 4.6  3.5 - 5.1 (mEq/L)   Chloride 102  96 - 112 (mEq/L)   CO2 29  19 - 32 (mEq/L)   Glucose, Bld 94  70 - 99 (mg/dL)   BUN 9  6 - 23 (mg/dL)   Creatinine, Ser 7.82  0.50 - 1.10 (mg/dL)   Calcium 95.6  8.4 - 10.5 (mg/dL)   Total Protein 7.7  6.0 - 8.3 (g/dL)   Albumin 4.0  3.5 - 5.2 (g/dL)   AST 23  0 - 37 (U/L)   ALT 27  0 - 35 (U/L)   Alkaline Phosphatase  55  39 - 117 (U/L)   Total Bilirubin 0.3  0.3 - 1.2 (mg/dL)   GFR calc non Af Amer >60  >60 (mL/min)   GFR calc Af Amer >60  >60 (mL/min)   Ct Abdomen Pelvis W Wo Contrast  06/19/2011  *RADIOLOGY REPORT*  Clinical Data: Right lower quadrant abdominal pain.  Nausea.  CT ABDOMEN AND PELVIS WITHOUT AND WITH CONTRAST   Technique:  Multidetector CT imaging of the abdomen and pelvis was performed without contrast material in one or both body regions, followed by contrast material(s) and further sections in one or both body regions.  Contrast: OMNIPAQUE IOHEXOL 300 MG/ML IV SOLN  Comparison: 11/09/2010  Findings: Noncontrast images demonstrate diffuse hepatic steatosis with some fatty sparing along the gallbladder fossa.  No renal calculus is observed.  No hydronephrosis or hydroureter.  Postcontrast images demonstrate expected enhancement of the liver, spleen, pancreas, and adrenal glands.  The gallbladder and biliary system appear unremarkable.  The kidneys appear unremarkable, as do the proximal ureters.  No pathologic retroperitoneal or porta hepatis adenopathy is identified.  No pathologic pelvic adenopathy is identified.  The appendix appears normal.  The urinary bladder likewise appears normal.  Uterus is absent.  IMPRESSION:  1.  Diffuse hepatic steatosis, with some focal fatty sparing along the gallbladder fossa. 2.  No significant abnormality is observed account the patient's right lower quadrant abdominal pain.  Original Report Authenticated By: Dellia Cloud, M.D.     Assessment and Plan  Abd pain/back pain: no gyn etiology noted for her pain. Advised pt to f/u with the pain clinic. Discussed diet, activity, risks, and precautions.  Clinton Gallant. Tennessee Perra III, DrHSc, MPAS, PA-C  06/19/2011, 12:44 PM   Henrietta Hoover, PA 06/19/11 1553

## 2011-06-21 LAB — BASIC METABOLIC PANEL
CO2: 27
Calcium: 9.4
Creatinine, Ser: 0.79
GFR calc non Af Amer: >60
Glucose, Bld: 112 — ABNORMAL HIGH
Sodium: 139

## 2011-06-21 LAB — URINALYSIS, ROUTINE W REFLEX MICROSCOPIC
Bilirubin Urine: NEGATIVE
Hgb urine dipstick: NEGATIVE
Nitrite: NEGATIVE
Protein, ur: NEGATIVE
Specific Gravity, Urine: 1.023
Urobilinogen, UA: 1

## 2011-06-21 LAB — DIFFERENTIAL
Basophils Absolute: 0
Basophils Relative: 1
Lymphocytes Relative: 36
Monocytes Absolute: 0.5
Neutro Abs: 2.9
Neutrophils Relative %: 54

## 2011-06-21 LAB — CBC
Hemoglobin: 12.4
MCHC: 34.5
Platelets: 203
RDW: 13

## 2011-06-21 LAB — RAPID STREP SCREEN (MED CTR MEBANE ONLY): Streptococcus, Group A Screen (Direct): POSITIVE — AB

## 2011-06-25 LAB — URINALYSIS, ROUTINE W REFLEX MICROSCOPIC
Hgb urine dipstick: NEGATIVE
Nitrite: NEGATIVE
Protein, ur: NEGATIVE
Specific Gravity, Urine: >1.03 — ABNORMAL HIGH
Urobilinogen, UA: 0.2

## 2011-06-25 LAB — GC/CHLAMYDIA PROBE AMP, GENITAL: GC Probe Amp, Genital: NEGATIVE

## 2011-06-25 LAB — CBC
HCT: 33.7 — ABNORMAL LOW
Hemoglobin: 11.4 — ABNORMAL LOW
MCHC: 33.7
RBC: 3.77 — ABNORMAL LOW

## 2011-06-25 LAB — WET PREP, GENITAL
Trich, Wet Prep: NONE SEEN
Yeast Wet Prep HPF POC: NONE SEEN

## 2011-06-26 LAB — GLUCOSE, CAPILLARY

## 2011-07-09 LAB — I-STAT 8, (EC8 V) (CONVERTED LAB)
BUN: 10
Bicarbonate: 26 — ABNORMAL HIGH
Chloride: 105
Glucose, Bld: 92
HCT: 40
Hemoglobin: 13.6
Operator id: 291361
Potassium: 4.2
Sodium: 140
TCO2: 27
pCO2, Ven: 45.9
pH, Ven: 7.362 — ABNORMAL HIGH

## 2011-07-09 LAB — POCT I-STAT CREATININE
Creatinine, Ser: 1.1
Operator id: 291361

## 2011-07-09 LAB — URINALYSIS, ROUTINE W REFLEX MICROSCOPIC
Hgb urine dipstick: NEGATIVE
Nitrite: NEGATIVE
Specific Gravity, Urine: 1.024
Urobilinogen, UA: 0.2
pH: 5.5

## 2011-07-09 LAB — CBC
MCHC: 34.4
MCV: 87.6
Platelets: 191
RDW: 13.5

## 2011-07-09 LAB — GC/CHLAMYDIA PROBE AMP, GENITAL
Chlamydia, DNA Probe: NEGATIVE
GC Probe Amp, Genital: NEGATIVE

## 2011-07-09 LAB — DIFFERENTIAL
Basophils Absolute: 0
Basophils Relative: 1
Eosinophils Absolute: 0
Neutrophils Relative %: 51

## 2011-07-09 LAB — WET PREP, GENITAL
Clue Cells Wet Prep HPF POC: NONE SEEN
Trich, Wet Prep: NONE SEEN
WBC, Wet Prep HPF POC: NONE SEEN
Yeast Wet Prep HPF POC: NONE SEEN

## 2011-07-12 DIAGNOSIS — E1165 Type 2 diabetes mellitus with hyperglycemia: Secondary | ICD-10-CM | POA: Insufficient documentation

## 2011-09-11 ENCOUNTER — Other Ambulatory Visit: Payer: Self-pay

## 2011-09-11 ENCOUNTER — Emergency Department (HOSPITAL_BASED_OUTPATIENT_CLINIC_OR_DEPARTMENT_OTHER)
Admission: EM | Admit: 2011-09-11 | Discharge: 2011-09-11 | Disposition: A | Discharge status: Home / Self Care | Payer: Medicaid Other | Attending: Emergency Medicine | Admitting: Emergency Medicine

## 2011-09-11 ENCOUNTER — Emergency Department (INDEPENDENT_AMBULATORY_CARE_PROVIDER_SITE_OTHER): Payer: Medicaid Other

## 2011-09-11 ENCOUNTER — Encounter (HOSPITAL_BASED_OUTPATIENT_CLINIC_OR_DEPARTMENT_OTHER): Payer: Self-pay | Admitting: Emergency Medicine

## 2011-09-11 DIAGNOSIS — Z8739 Personal history of other diseases of the musculoskeletal system and connective tissue: Secondary | ICD-10-CM | POA: Insufficient documentation

## 2011-09-11 DIAGNOSIS — R799 Abnormal finding of blood chemistry, unspecified: Secondary | ICD-10-CM

## 2011-09-11 DIAGNOSIS — R062 Wheezing: Secondary | ICD-10-CM

## 2011-09-11 DIAGNOSIS — R079 Chest pain, unspecified: Secondary | ICD-10-CM | POA: Insufficient documentation

## 2011-09-11 DIAGNOSIS — R42 Dizziness and giddiness: Secondary | ICD-10-CM | POA: Insufficient documentation

## 2011-09-11 DIAGNOSIS — R0789 Other chest pain: Secondary | ICD-10-CM

## 2011-09-11 DIAGNOSIS — R0602 Shortness of breath: Secondary | ICD-10-CM

## 2011-09-11 DIAGNOSIS — E119 Type 2 diabetes mellitus without complications: Secondary | ICD-10-CM | POA: Insufficient documentation

## 2011-09-11 DIAGNOSIS — K589 Irritable bowel syndrome without diarrhea: Secondary | ICD-10-CM | POA: Insufficient documentation

## 2011-09-11 DIAGNOSIS — J029 Acute pharyngitis, unspecified: Secondary | ICD-10-CM | POA: Insufficient documentation

## 2011-09-11 DIAGNOSIS — J45909 Unspecified asthma, uncomplicated: Secondary | ICD-10-CM | POA: Insufficient documentation

## 2011-09-11 DIAGNOSIS — R059 Cough, unspecified: Secondary | ICD-10-CM

## 2011-09-11 DIAGNOSIS — M25519 Pain in unspecified shoulder: Secondary | ICD-10-CM | POA: Insufficient documentation

## 2011-09-11 DIAGNOSIS — Z79899 Other long term (current) drug therapy: Secondary | ICD-10-CM | POA: Insufficient documentation

## 2011-09-11 DIAGNOSIS — R05 Cough: Secondary | ICD-10-CM

## 2011-09-11 LAB — CBC
HCT: 38.3 % (ref 36.0–46.0)
Hemoglobin: 12.7 g/dL (ref 12.0–15.0)
WBC: 5.1 10*3/uL (ref 4.0–10.5)

## 2011-09-11 LAB — BASIC METABOLIC PANEL
BUN: 12 mg/dL (ref 6–23)
Chloride: 105 mEq/L (ref 96–112)
Glucose, Bld: 102 mg/dL — ABNORMAL HIGH (ref 70–99)
Potassium: 4.2 mEq/L (ref 3.5–5.1)

## 2011-09-11 MED ORDER — IBUPROFEN 200 MG PO TABS: 400.0000 mg | ORAL_TABLET | Freq: Four times a day (QID) | ORAL | Status: AC | PRN

## 2011-09-11 MED ORDER — ALBUTEROL SULFATE (5 MG/ML) 0.5% IN NEBU
2.5000 mg | INHALATION_SOLUTION | Freq: Once | RESPIRATORY_TRACT | Status: AC
  Administered 2011-09-11: 2.5 mg via RESPIRATORY_TRACT
  Filled 2011-09-11: qty 0.5

## 2011-09-11 MED ORDER — ACETAMINOPHEN 325 MG PO TABS
975.0000 mg | ORAL_TABLET | Freq: Once | ORAL | Status: AC
  Administered 2011-09-11: 975 mg via ORAL
  Filled 2011-09-11: qty 3

## 2011-09-11 MED ORDER — ASPIRIN 81 MG PO CHEW
324.0000 mg | CHEWABLE_TABLET | Freq: Once | ORAL | Status: AC
  Administered 2011-09-11: 324 mg via ORAL

## 2011-09-11 MED ORDER — IBUPROFEN 200 MG PO TABS: 400.0000 mg | ORAL_TABLET | Freq: Four times a day (QID) | ORAL | Status: DC | PRN

## 2011-09-11 MED ORDER — ASPIRIN 81 MG PO CHEW
CHEWABLE_TABLET | ORAL | Status: AC
  Administered 2011-09-11: 324 mg via ORAL
  Filled 2011-09-11: qty 4

## 2011-09-11 MED ORDER — IOHEXOL 350 MG/ML SOLN
80.0000 mL | Freq: Once | INTRAVENOUS | Status: AC | PRN
  Administered 2011-09-11: 80 mL via INTRAVENOUS

## 2011-09-11 NOTE — ED Provider Notes (Signed)
History     CSN: [id number] Arrival date & time: 09/11/2011  8:45 AM   First MD Initiated Contact with Patient 09/11/11 [phone]      Chief Complaint  Patient presents with  . Sore Throat  . Chest Pain  . Dizziness    (Consider location/radiation/quality/duration/timing/severity/associated sxs/prior treatment) HPI [name] is a 30 year old woman with history of Type 2 DM, endometriosis, Asthma, Hashimoto Thyroiditis, Thyroid mass, anxiety disorder, and SVT who presents after experiencing chest pain twice this morning.  The first episode was at 3am and the second at 7am.  Both were the same pain, a 10/10 dull pain in upper R chest that radiated to her back and was also in her R face.  She was in bed both times and had associated SOB and diaphoresis.  She also has felt light headed this morning.  The chest pain has resolved but she is still light headed.   She has had chronic R sided dull chest pain, 7-8/10, recently.  She saw [facility] pulmonology last week for this (per pt report, no note in EPIC).  There she says she received a steroid shot and a breathing treatment, but was told her chest pain likely was not from her asthma.    She has had sore throat since yesterday and wheezing since yesterday.  Minimal cough.    One of her children recently had strep throat and two had likely viral URIs recently.  No headache.     Past Medical History  Diagnosis Date  . Endometriosis   . Type 2 diabetes mellitus   . Anemia   . BV (bacterial vaginosis)   . Hepatic steatosis   . Arthritis   . Asthma   . IBS (irritable bowel syndrome)   . Obesity   . Chronic pelvic pain in female   . Hashimoto thyroiditis   . OSA (obstructive sleep apnea)   . Anxiety disorder   . SVT (supraventricular tachycardia)   . Diabetes mellitus     Past Surgical History  Procedure Date  . Total abdominal hysterectomy 2004  . Tubal ligation 2001  . Shoulder surgery 2008    right  . Wrist surgery 2008   right  . Cystostomy w/ bladder biopsy   . Bladder staple     Family History  Problem Relation Age of Onset  . Alcohol abuse    . Arthritis Maternal Grandmother   . Diabetes Daughter     type 2  . Hypertension    . Hyperlipidemia    . Kidney disease    . Colon cancer      uncle  . Prostate cancer      uncles and grandfather  . Cirrhosis      both grandmother and grandfather  . Lung cancer    . Heart disease    . Thyroid disease    . Colon polyps    . Liver disease      History  Substance Use Topics  . Smoking status: Never Smoker   . Smokeless tobacco: Never Used  . Alcohol Use: No    Review of Systems  All other systems reviewed and are negative.    Allergies  Amoxicillin and Penicillins  Home Medications   Current Outpatient Rx  Name Route Sig Dispense Refill  . GLIPIZIDE ER 10 MG PO TB24 Oral Take 10 mg by mouth daily.      . LUBIPROSTONE 8 MCG PO CAPS Oral Take 8 mcg by mouth daily  with breakfast.     . NEBIVOLOL HCL 5 MG PO TABS Oral Take 5 mg by mouth daily.      Marland Kitchen PENTOSAN POLYSULFATE SODIUM 100 MG PO CAPS Oral Take 100 mg by mouth 2 (two) times daily.      Marland Kitchen URELLE 81 MG PO TABS Oral Take 1 tablet by mouth every 6 (six) hours as needed.      Marland Kitchen DIAZEPAM 5 MG PO TABS Oral Take 5 mg by mouth at bedtime as needed.        BP 132/82  Pulse 112  Temp(Src) 100 F (37.8 C) (Oral)  Resp 24  Ht 5' (1.524 m)  Wt 199 lb (90.266 kg)  BMI 38.86 kg/m2  SpO2 100%  Physical Exam  General: alert, well-developed, and cooperative to examination.  Head: normocephalic and atraumatic.  Eyes: vision grossly intact, pupils equal, pupils round, pupils reactive to light, no injection and anicteric.  Mouth: pharynx pink and moist, no erythema, and no exudates. Large tonsils (patient says tonsils large at baseline) Neck: Thickened skin anterior lower neck.  no JVD.  Lungs: Mild bibasilar wheezing. Normal respiratory effort, no accessory muscle use, no  crackles. Heart: normal rate, regular rhythm, no murmur, no gallop, and no rub.  Chest: Tender to palpation diffusely, worst in right and central chest Abdomen: soft, non-tender, normal bowel sounds, no distention, no guarding, no rebound tenderness Msk: no joint swelling, no joint warmth, and no redness over joints.  Pulses: 2+ DP/PT pulses bilaterally Extremities: No cyanosis, clubbing, edema Neurologic: alert & oriented X3, cranial nerves II-XII intact, strength normal in all extremities. Skin: turgor normal and no rashes.    ED Course  Procedures (including critical care time)   Labs Reviewed  CBC  BASIC METABOLIC PANEL  D-DIMER, QUANTITATIVE  TROPONIN I   Results for orders placed during the hospital encounter of 09/11/11  CBC      Component Value Range   WBC 5.1  4.0 - 10.5 (K/uL)   RBC 4.36  3.87 - 5.11 (MIL/uL)   Hemoglobin 12.7  12.0 - 15.0 (g/dL)   HCT 96.0  45.4 - 09.8 (%)   MCV 87.8  78.0 - 100.0 (fL)   MCH 29.1  26.0 - 34.0 (pg)   MCHC 33.2  30.0 - 36.0 (g/dL)   RDW 11.9  14.7 - 82.9 (%)   Platelets 222  150 - 400 (K/uL)  BASIC METABOLIC PANEL      Component Value Range   Sodium 140  135 - 145 (mEq/L)   Potassium 4.2  3.5 - 5.1 (mEq/L)   Chloride 105  96 - 112 (mEq/L)   CO2 25  19 - 32 (mEq/L)   Glucose, Bld 102 (*) 70 - 99 (mg/dL)   BUN 12  6 - 23 (mg/dL)   Creatinine, Ser 5.62  0.50 - 1.10 (mg/dL)   Calcium 9.8  8.4 - 13.0 (mg/dL)   GFR calc non Af Amer >90  >90 (mL/min)   GFR calc Af Amer >90  >90 (mL/min)  D-DIMER, QUANTITATIVE      Component Value Range   D-Dimer, Quant 1.40 (*) 0.00 - 0.48 (ug/mL-FEU)  TROPONIN I      Component Value Range   Troponin I <0.30  <0.30 (ng/mL)   No leukocytosis.  Elevated D-dimer, so CT angiogram chest ordered.    Dg Chest 2 View  09/11/2011  *RADIOLOGY REPORT*  Clinical Data: Chest pain radiating to right shoulder. Lightheadedness.  Cough and wheezing.  CHEST -  2 VIEW  Comparison:  01/09/2011  Findings:  The  heart size and mediastinal contours are within normal limits.  Both lungs are clear.  The visualized skeletal structures are unremarkable.  IMPRESSION: No active cardiopulmonary disease.  Original Report Authenticated By: Danae Orleans, M.D.   Ct Angio Chest W/cm &/or Wo Cm  09/11/2011  *RADIOLOGY REPORT*  Clinical Data:  Chest pain and shortness of breath.  Elevated D- dimer.  CT ANGIOGRAPHY CHEST WITH CONTRAST  Technique:  Multidetector CT imaging of the chest was performed using the standard protocol during bolus administration of intravenous contrast.  Multiplanar CT image reconstructions including MIPs were obtained to evaluate the vascular anatomy.  Contrast: 80mL OMNIPAQUE IOHEXOL 350 MG/ML IV SOLN  Comparison:  Chest x-ray dated 09/12/2011 and CTA of the chest dated 11/05/2008  Findings:  There are no pulmonary emboli.  No infiltrates, effusions, or mass lesions.  No hilar or mediastinal adenopathy. Heart size is normal.  Diffuse hepatic steatosis.  No osseous abnormality.  Review of the MIP images confirms the above findings.  IMPRESSION: No acute abnormality.  No pulmonary emboli.  Original Report Authenticated By: Gwynn Burly, M.D.    No evidence of PE on CT angiogram.   Patient's breathing improved after nebulizer treatment.  Diagnosis: Chest pain, likely URI as well   MDM  I reviewed the entirety of this case with my attending Dr. Patria Mane.  He personally interviewed and examined this patient as well.  Patient is being discharged home and should see cardiology at [facility] where she has gone previously.  She should call them in 2 days if her pain has not resolved.   Recommend she take over the counter ibuprofen for the musculoskeletal element of her chest wall tenderness in the mean while.        Blanca Friend, MD 09/11/11 [phone]

## 2011-09-11 NOTE — ED Provider Notes (Signed)
I saw and evaluated the patient, reviewed the resident's note and I agree with the findings and plan.   Date: 09/11/2011  Rate: 102  Rhythm: Sinus tachycardia  QRS Axis: normal  Intervals: normal  ST/T Wave abnormalities: normal  Conduction Disutrbances:none  Narrative Interpretation:   Old EKG Reviewed: No prior ecg available  1. Chest pain     Patient with constant right-sided pleuritic chest pain with elevated d-dimer.  Her CT angiogram is negative for evidence of pulmonary embolism or other acute pathology.  Her EKG is normal.  My suspicion that this is ACS is very low.  She's had improvement in her breathing with an albuterol treatment in the ER and I suspect there is a degree of bronchitis as well as reactive airway disease.  Patient be discharged home with anti-inflammatories for chest pain as well as schedule albuterol for her breathing.  Close followup with her primary care Dr. as needed if not improved in 2-3 days.   Dg Chest 2 View  09/11/2011  *RADIOLOGY REPORT*  Clinical Data: Chest pain radiating to right shoulder. Lightheadedness.  Cough and wheezing.  CHEST - 2 VIEW  Comparison:  01/09/2011  Findings:  The heart size and mediastinal contours are within normal limits.  Both lungs are clear.  The visualized skeletal structures are unremarkable.  IMPRESSION: No active cardiopulmonary disease.  Original Report Authenticated By: Danae Orleans, M.D.   Ct Angio Chest W/cm &/or Wo Cm  09/11/2011  *RADIOLOGY REPORT*  Clinical Data:  Chest pain and shortness of breath.  Elevated D- dimer.  CT ANGIOGRAPHY CHEST WITH CONTRAST  Technique:  Multidetector CT imaging of the chest was performed using the standard protocol during bolus administration of intravenous contrast.  Multiplanar CT image reconstructions including MIPs were obtained to evaluate the vascular anatomy.  Contrast: 80mL OMNIPAQUE IOHEXOL 350 MG/ML IV SOLN  Comparison:  Chest x-ray dated 09/12/2011 and CTA of the chest dated  11/05/2008  Findings:  There are no pulmonary emboli.  No infiltrates, effusions, or mass lesions.  No hilar or mediastinal adenopathy. Heart size is normal.  Diffuse hepatic steatosis.  No osseous abnormality.  Review of the MIP images confirms the above findings.  IMPRESSION: No acute abnormality.  No pulmonary emboli.  Original Report Authenticated By: Gwynn Burly, M.D.    Personally evaluated but the x-ray as well as the CT scan  Results for orders placed during the hospital encounter of 09/11/11  CBC      Component Value Range   WBC 5.1  4.0 - 10.5 (K/uL)   RBC 4.36  3.87 - 5.11 (MIL/uL)   Hemoglobin 12.7  12.0 - 15.0 (g/dL)   HCT 40.9  81.1 - 91.4 (%)   MCV 87.8  78.0 - 100.0 (fL)   MCH 29.1  26.0 - 34.0 (pg)   MCHC 33.2  30.0 - 36.0 (g/dL)   RDW 78.2  95.6 - 21.3 (%)   Platelets 222  150 - 400 (K/uL)  BASIC METABOLIC PANEL      Component Value Range   Sodium 140  135 - 145 (mEq/L)   Potassium 4.2  3.5 - 5.1 (mEq/L)   Chloride 105  96 - 112 (mEq/L)   CO2 25  19 - 32 (mEq/L)   Glucose, Bld 102 (*) 70 - 99 (mg/dL)   BUN 12  6 - 23 (mg/dL)   Creatinine, Ser 0.86  0.50 - 1.10 (mg/dL)   Calcium 9.8  8.4 - 57.8 (mg/dL)   GFR  calc non Af Amer >90  >90 (mL/min)   GFR calc Af Amer >90  >90 (mL/min)  D-DIMER, QUANTITATIVE      Component Value Range   D-Dimer, Quant 1.40 (*) 0.00 - 0.48 (ug/mL-FEU)  TROPONIN I      Component Value Range   Troponin I <0.30  <0.30 (ng/mL)      Lyanne Co, MD 09/11/11 1133

## 2011-09-11 NOTE — ED Notes (Signed)
Pt states she has been having chest pain radiating to right shoulder, right jaw and back.  Some lightheadedness and weakness.  Some sore throat and cough and wheezing.  Family has been sick recently with viral and strep infections.  No known fever.

## 2011-09-14 ENCOUNTER — Ambulatory Visit (HOSPITAL_COMMUNITY)
Admission: RE | Admit: 2011-09-14 | Discharge: 2011-09-14 | Disposition: A | Discharge status: Home / Self Care | Payer: Medicaid Other | Source: Ambulatory Visit | Attending: Family Medicine | Admitting: Family Medicine

## 2011-09-14 DIAGNOSIS — M79609 Pain in unspecified limb: Secondary | ICD-10-CM

## 2011-09-14 DIAGNOSIS — M7989 Other specified soft tissue disorders: Secondary | ICD-10-CM | POA: Insufficient documentation

## 2011-09-14 DIAGNOSIS — R52 Pain, unspecified: Secondary | ICD-10-CM

## 2011-09-14 NOTE — Progress Notes (Signed)
*  PRELIMINARY RESULTS*  BLEV Duplex has been performed.  No obvious evidence of deep vein thrombosis bilaterally. No evidence of a Baker's Cyst bilaterally.  Lisa Cole 09/14/2011, 11:07 AM

## 2011-12-12 ENCOUNTER — Other Ambulatory Visit: Payer: Self-pay | Admitting: Obstetrics and Gynecology

## 2011-12-12 DIAGNOSIS — IMO0002 Reserved for concepts with insufficient information to code with codable children: Secondary | ICD-10-CM

## 2011-12-18 ENCOUNTER — Other Ambulatory Visit: Payer: Medicaid Other

## 2011-12-18 ENCOUNTER — Other Ambulatory Visit: Payer: Self-pay | Admitting: Obstetrics and Gynecology

## 2011-12-18 ENCOUNTER — Ambulatory Visit
Admission: RE | Admit: 2011-12-18 | Discharge: 2011-12-18 | Disposition: A | Discharge status: Home / Self Care | Payer: PPO | Source: Ambulatory Visit | Attending: Obstetrics and Gynecology | Admitting: Obstetrics and Gynecology

## 2011-12-18 DIAGNOSIS — IMO0002 Reserved for concepts with insufficient information to code with codable children: Secondary | ICD-10-CM

## 2012-01-28 DIAGNOSIS — IMO0001 Reserved for inherently not codable concepts without codable children: Secondary | ICD-10-CM | POA: Insufficient documentation

## 2012-01-28 DIAGNOSIS — R490 Dysphonia: Secondary | ICD-10-CM | POA: Insufficient documentation

## 2012-02-04 ENCOUNTER — Emergency Department (INDEPENDENT_AMBULATORY_CARE_PROVIDER_SITE_OTHER): Payer: PPO

## 2012-02-04 ENCOUNTER — Encounter (HOSPITAL_BASED_OUTPATIENT_CLINIC_OR_DEPARTMENT_OTHER): Payer: Self-pay | Admitting: *Deleted

## 2012-02-04 ENCOUNTER — Emergency Department (HOSPITAL_BASED_OUTPATIENT_CLINIC_OR_DEPARTMENT_OTHER)
Admission: EM | Admit: 2012-02-04 | Discharge: 2012-02-04 | Disposition: A | Discharge status: Home / Self Care | Payer: PPO | Attending: Emergency Medicine | Admitting: Emergency Medicine

## 2012-02-04 ENCOUNTER — Emergency Department (HOSPITAL_COMMUNITY): Payer: PPO

## 2012-02-04 DIAGNOSIS — R7989 Other specified abnormal findings of blood chemistry: Secondary | ICD-10-CM

## 2012-02-04 DIAGNOSIS — I499 Cardiac arrhythmia, unspecified: Secondary | ICD-10-CM

## 2012-02-04 DIAGNOSIS — F419 Anxiety disorder, unspecified: Secondary | ICD-10-CM

## 2012-02-04 DIAGNOSIS — J45909 Unspecified asthma, uncomplicated: Secondary | ICD-10-CM | POA: Insufficient documentation

## 2012-02-04 DIAGNOSIS — R0602 Shortness of breath: Secondary | ICD-10-CM

## 2012-02-04 DIAGNOSIS — R791 Abnormal coagulation profile: Secondary | ICD-10-CM | POA: Insufficient documentation

## 2012-02-04 DIAGNOSIS — E119 Type 2 diabetes mellitus without complications: Secondary | ICD-10-CM | POA: Insufficient documentation

## 2012-02-04 DIAGNOSIS — R071 Chest pain on breathing: Secondary | ICD-10-CM | POA: Insufficient documentation

## 2012-02-04 DIAGNOSIS — F411 Generalized anxiety disorder: Secondary | ICD-10-CM | POA: Insufficient documentation

## 2012-02-04 DIAGNOSIS — R42 Dizziness and giddiness: Secondary | ICD-10-CM | POA: Insufficient documentation

## 2012-02-04 DIAGNOSIS — R079 Chest pain, unspecified: Secondary | ICD-10-CM | POA: Insufficient documentation

## 2012-02-04 DIAGNOSIS — K589 Irritable bowel syndrome without diarrhea: Secondary | ICD-10-CM | POA: Insufficient documentation

## 2012-02-04 DIAGNOSIS — Z79899 Other long term (current) drug therapy: Secondary | ICD-10-CM | POA: Insufficient documentation

## 2012-02-04 DIAGNOSIS — R000 Tachycardia, unspecified: Secondary | ICD-10-CM

## 2012-02-04 DIAGNOSIS — R0789 Other chest pain: Secondary | ICD-10-CM

## 2012-02-04 HISTORY — DX: Interstitial cystitis (chronic) without hematuria: N30.10

## 2012-02-04 LAB — BASIC METABOLIC PANEL
BUN: 8 mg/dL (ref 6–23)
CO2: 27 mEq/L (ref 19–32)
Chloride: 103 mEq/L (ref 96–112)
Creatinine, Ser: 0.8 mg/dL (ref 0.50–1.10)

## 2012-02-04 LAB — CBC
HCT: 37.3 % (ref 36.0–46.0)
Hemoglobin: 12.7 g/dL (ref 12.0–15.0)
MCV: 89 fL (ref 78.0–100.0)
RBC: 4.19 MIL/uL (ref 3.87–5.11)
WBC: 5.6 10*3/uL (ref 4.0–10.5)

## 2012-02-04 LAB — TROPONIN I: Troponin I: <0.3 ng/mL (ref <0.30)

## 2012-02-04 MED ORDER — IOHEXOL 350 MG/ML SOLN
70.0000 mL | Freq: Once | INTRAVENOUS | Status: AC | PRN
  Administered 2012-02-04: 70 mL via INTRAVENOUS

## 2012-02-04 MED ORDER — HYDROMORPHONE HCL PF 1 MG/ML IJ SOLN
1.0000 mg | Freq: Once | INTRAMUSCULAR | Status: AC
  Administered 2012-02-04: 1 mg via INTRAVENOUS
  Filled 2012-02-04: qty 1

## 2012-02-04 MED ORDER — MECLIZINE HCL 25 MG PO TABS
25.0000 mg | ORAL_TABLET | Freq: Once | ORAL | Status: AC
Start: 2012-02-04 — End: 2012-02-04
  Administered 2012-02-04: 25 mg via ORAL
  Filled 2012-02-04: qty 1

## 2012-02-04 MED ORDER — ONDANSETRON HCL 4 MG/2ML IJ SOLN
4.0000 mg | Freq: Once | INTRAMUSCULAR | Status: AC
  Administered 2012-02-04: 4 mg via INTRAVENOUS
  Filled 2012-02-04: qty 2

## 2012-02-04 MED ORDER — KETOROLAC TROMETHAMINE 30 MG/ML IJ SOLN
30.0000 mg | Freq: Once | INTRAMUSCULAR | Status: AC
  Administered 2012-02-04: 30 mg via INTRAVENOUS
  Filled 2012-02-04: qty 1

## 2012-02-04 MED ORDER — ACETAMINOPHEN 325 MG PO TABS
650.0000 mg | ORAL_TABLET | Freq: Once | ORAL | Status: AC
  Administered 2012-02-04: 650 mg via ORAL
  Filled 2012-02-04: qty 2

## 2012-02-04 MED ORDER — SODIUM CHLORIDE 0.9 % IV BOLUS (SEPSIS)
1000.0000 mL | Freq: Once | INTRAVENOUS | Status: AC
  Administered 2012-02-04: 1000 mL via INTRAVENOUS

## 2012-02-04 MED ORDER — LORAZEPAM 1 MG PO TABS: 1.0000 mg | ORAL_TABLET | Freq: Three times a day (TID) | ORAL | Status: AC | PRN

## 2012-02-04 MED ORDER — LORAZEPAM 2 MG/ML IJ SOLN
1.0000 mg | Freq: Once | INTRAMUSCULAR | Status: AC
  Administered 2012-02-04: 1 mg via INTRAVENOUS
  Filled 2012-02-04: qty 1

## 2012-02-04 MED ORDER — MECLIZINE HCL 25 MG PO TABS: 25.0000 mg | ORAL_TABLET | Freq: Four times a day (QID) | ORAL | Status: AC

## 2012-02-04 MED ORDER — ASPIRIN 81 MG PO CHEW
324.0000 mg | CHEWABLE_TABLET | Freq: Once | ORAL | Status: AC
  Administered 2012-02-04: 324 mg via ORAL
  Filled 2012-02-04: qty 1

## 2012-02-04 NOTE — ED Notes (Signed)
Patient states she developed central chest pain 2 days ago and is associated with dizziness.  States she has been out of her bystotlic for one month.

## 2012-02-04 NOTE — ED Notes (Signed)
MD to bedside.

## 2012-02-04 NOTE — ED Notes (Signed)
Pt sitting in stretcher, states feeling a little better at this time.  IV fluids continue infusing.  Pt aware that we will reevaluate after fluids infuse.

## 2012-02-04 NOTE — ED Notes (Signed)
PA at bedside.  Pt denies nausea.  Zofran held at this time.

## 2012-02-04 NOTE — ED Provider Notes (Signed)
I was asked to see the patient, because she was not getting along well with the provider seeing her. The patient was alert, calm, cooperative when I saw her. She states the chest pain, that she had earlier, is just about gone. She has been evaluated for PE, and none was found. Her major complaint when I saw her was dizziness. The dizziness is been ongoing for a couple of days. It is worse when standing. Vital signs have remained fairly normal. She is not technically orthostatic, but had mild increase in pulse going from supine to standing. She was treated with additional IV fluids with subjective improvement at 2215. She also received Ativan to help with her dizziness. After treatment. She denied nausea. Patient reports she is having trouble seeing both her PCP and her cardiologist, because of unpaid bills. She is working with Medicaid caseworker to that insurance.  Diagnoses #1 nonspecific chest pain. #2 anxiety. #3 nonspecific dizziness.  Plan prescription for Ativan, #15 to use as needed for dizziness, or anxiousness. Followup with PCP when necessary choice as soon as possible.  Flint Melter, MD 02/04/12 2236

## 2012-02-04 NOTE — ED Provider Notes (Addendum)
History     CSN: [id number]  Arrival date & time 02/04/12  4098   First MD Initiated Contact with Patient 02/04/12 [phone]      Chief Complaint  Patient presents with  . Chest Pain    (Consider location/radiation/quality/duration/timing/severity/associated sxs/prior treatment) Patient is a 31 y.o. female presenting with chest pain. The history is provided by the patient.  Chest Pain The chest pain began 2 days ago. Chest pain occurs constantly. The chest pain is unchanged. The pain is associated with breathing and coughing (Any type of movement). At its most intense, the pain is at 9/10. The pain is currently at 9/10. The severity of the pain is moderate. The quality of the pain is described as sharp. The pain radiates to the upper back. Chest pain is worsened by certain positions and deep breathing. Primary symptoms include shortness of breath and dizziness. Pertinent negatives for primary symptoms include no fever, no fatigue, no syncope, no cough, no wheezing, no palpitations, no abdominal pain, no nausea and no vomiting.  Dizziness does not occur with nausea or vomiting.     patient with similar chest pain approximately 4 months ago with workup including Doppler studies d-dimer CT chest without specific findings. Pain currently been present nonstop for 2 days substernal area described as sharp radiates to upper back worse with breathing movement and cough.  Past Medical History  Diagnosis Date  . Endometriosis   . Type 2 diabetes mellitus   . Anemia   . BV (bacterial vaginosis)   . Hepatic steatosis   . Arthritis   . Asthma   . IBS (irritable bowel syndrome)   . Obesity   . Chronic pelvic pain in female   . Hashimoto thyroiditis   . OSA (obstructive sleep apnea)   . Anxiety disorder   . SVT (supraventricular tachycardia)   . Diabetes mellitus   . IC (interstitial cystitis)     Past Surgical History  Procedure Date  . Total abdominal hysterectomy 2004  . Tubal ligation  2001  . Shoulder surgery 2008    right  . Wrist surgery 2008    right  . Cystostomy w/ bladder biopsy   . Bladder staple     Family History  Problem Relation Age of Onset  . Alcohol abuse    . Arthritis Maternal Grandmother   . Diabetes Daughter     type 2  . Hypertension    . Hyperlipidemia    . Kidney disease    . Colon cancer      uncle  . Prostate cancer      uncles and grandfather  . Cirrhosis      both grandmother and grandfather  . Lung cancer    . Heart disease    . Thyroid disease    . Colon polyps    . Liver disease      History  Substance Use Topics  . Smoking status: Never Smoker   . Smokeless tobacco: Never Used  . Alcohol Use: No    OB History    Grav Para Term Preterm Abortions TAB SAB Ect Mult Living   4 3 3  0 1 0 1 0 0 3      Review of Systems  Constitutional: Negative for fever and fatigue.  HENT: Negative for congestion and neck pain.   Eyes: Negative for visual disturbance.  Respiratory: Positive for shortness of breath. Negative for cough and wheezing.   Cardiovascular: Positive for chest pain. Negative for palpitations and  syncope.  Gastrointestinal: Negative for nausea, vomiting and abdominal pain.  Genitourinary: Negative for dysuria.  Musculoskeletal: Positive for back pain.  Skin: Negative for rash.  Neurological: Positive for dizziness. Negative for headaches.  Hematological: Does not bruise/bleed easily.    Allergies  Amoxicillin and Penicillins  Home Medications   Current Outpatient Rx  Name Route Sig Dispense Refill  . GLIPIZIDE ER 10 MG PO TB24 Oral Take 10 mg by mouth daily. Pt not compliant because of financial    . LUBIPROSTONE 8 MCG PO CAPS Oral Take 8 mcg by mouth daily with breakfast. Non compliant because of financial  reasons    . NEBIVOLOL HCL 5 MG PO TABS Oral Take 5 mg by mouth daily. Non compliant because of financial reasons    . PENTOSAN POLYSULFATE SODIUM 100 MG PO CAPS Oral Take 100 mg by mouth 2 (two)  times daily.      Marland Kitchen URELLE 81 MG PO TABS Oral Take 1 tablet by mouth every 6 (six) hours as needed.        BP 123/62  Pulse 69  Temp(Src) 98.5 F (36.9 C) (Oral)  Resp 18  Ht 5' (1.524 m)  Wt 190 lb (86.183 kg)  BMI 37.11 kg/m2  SpO2 99%  Physical Exam  Nursing note and vitals reviewed. Constitutional: She is oriented to person, place, and time. She appears well-developed and well-nourished.  HENT:  Head: Normocephalic and atraumatic.  Mouth/Throat: Oropharynx is clear and moist.  Eyes: Conjunctivae and EOM are normal. Pupils are equal, round, and reactive to light.  Neck: Normal range of motion. Neck supple.  Cardiovascular: Normal rate, regular rhythm and normal heart sounds.   No murmur heard. Pulmonary/Chest: Effort normal and breath sounds normal. No respiratory distress. She has no wheezes. She has no rales. She exhibits tenderness.  Abdominal: Soft. Bowel sounds are normal. There is no tenderness.  Musculoskeletal: Normal range of motion. She exhibits no edema and no tenderness.  Neurological: She is alert and oriented to person, place, and time. No cranial nerve deficit. She exhibits normal muscle tone. Coordination normal.  Skin: Skin is warm. No rash noted. She is not diaphoretic.    ED Course  Procedures (including critical care time)  Labs Reviewed  BASIC METABOLIC PANEL - Abnormal; Notable for the following:    Glucose, Bld 140 (*)    All other components within normal limits  D-DIMER, QUANTITATIVE - Abnormal; Notable for the following:    D-Dimer, Quant 0.86 (*)    All other components within normal limits  CBC  TROPONIN I   Dg Chest 2 View  02/04/2012  *RADIOLOGY REPORT*  Clinical Data: Chest pain, shortness of breath, tachycardia, arrhythmia  CHEST - 2 VIEW  Comparison: 09/11/2011  Findings: Upper-normal size of cardiac silhouette. Mediastinal contours and pulmonary vascularity normal. Minimal peribronchial thickening. No infiltrate, pleural effusion, or  pneumothorax. Bones unremarkable.  IMPRESSION: Minimal bronchitic changes.  Original Report Authenticated By: Lollie Marrow, M.D.   US Venous Img Lower Bilateral  02/04/2012  *RADIOLOGY REPORT*  Clinical Data: CHEST PAIN;;  BILATERAL LOWER EXTREMITY VENOUS DUPLEX ULTRASOUND  Technique: Gray-scale sonography with compression, as well as color and duplex ultrasound, were performed to evaluate the deep venous system from the level of the common femoral vein through the popliteal and proximal calf veins.  Comparison: None  Findings:  Normal compressibility and normal Doppler signal within the common femoral, superficial femoral and popliteal veins, down to the proximal calf veins.  No grayscale filling  defects to suggest DVT.  IMPRESSION: No evidence of bilateral lower extremity deep vein thrombosis.  Original Report Authenticated By: Cyndie Chime, M.D.    Date: 02/04/2012  Rate: 72  Rhythm: normal sinus rhythm  QRS Axis: normal  Intervals: normal  ST/T Wave abnormalities: normal  Conduction Disutrbances:none  Narrative Interpretation:   Old EKG Reviewed: unchanged EKG is unchanged compared to 09/11/2011  Results for orders placed during the hospital encounter of 02/04/12  CBC      Component Value Range   WBC 5.6  4.0 - 10.5 (K/uL)   RBC 4.19  3.87 - 5.11 (MIL/uL)   Hemoglobin 12.7  12.0 - 15.0 (g/dL)   HCT 78.2  95.6 - 21.3 (%)   MCV 89.0  78.0 - 100.0 (fL)   MCH 30.3  26.0 - 34.0 (pg)   MCHC 34.0  30.0 - 36.0 (g/dL)   RDW 08.6  57.8 - 46.9 (%)   Platelets 217  150 - 400 (K/uL)  BASIC METABOLIC PANEL      Component Value Range   Sodium 140  135 - 145 (mEq/L)   Potassium 4.1  3.5 - 5.1 (mEq/L)   Chloride 103  96 - 112 (mEq/L)   CO2 27  19 - 32 (mEq/L)   Glucose, Bld 140 (*) 70 - 99 (mg/dL)   BUN 8  6 - 23 (mg/dL)   Creatinine, Ser 6.29  0.50 - 1.10 (mg/dL)   Calcium 9.8  8.4 - 52.8 (mg/dL)   GFR calc non Af Amer >90  >90 (mL/min)   GFR calc Af Amer >90  >90 (mL/min)  TROPONIN I        Component Value Range   Troponin I <0.30  <0.30 (ng/mL)  D-DIMER, QUANTITATIVE      Component Value Range   D-Dimer, Quant 0.86 (*) 0.00 - 0.48 (ug/mL-FEU)     1. Chest pain       MDM  Patient clinically concerning for having a pulmonary embolism Doppler studies were negative they were done since we did not have CT angiogram although here due to the machine being down for 24 hours Will send patient to [facility] for [facility] Angiocath positive will require admission if negative can be discharged home with the diagnosis of chest wall pain. Not concerning for acute cardiac event the symptoms have been present for 2 days and troponin is negative EKG without any acute changes.          Shelda Jakes, MD 02/04/12 1313  Shelda Jakes, MD 02/09/12 [id number]

## 2012-02-04 NOTE — ED Notes (Signed)
Nurse Tech assisted pt to restroom.

## 2012-02-04 NOTE — ED Notes (Signed)
Pt back from CT.  Gave pt ice chips per request.

## 2012-02-04 NOTE — ED Notes (Signed)
Went in and spoke to pt.  Pt states she is upset with NP and she is calling her lawyer and [employer].  Apologized to pt that she is not happy with plan of care.  MD at bedside at this time.

## 2012-02-04 NOTE — ED Notes (Signed)
Glucose check - 100

## 2012-02-04 NOTE — ED Notes (Signed)
Meal tray given 

## 2012-02-04 NOTE — ED Provider Notes (Signed)
Medical screening examination/treatment/procedure(s) were performed by non-physician practitioner and as supervising physician I was immediately available for consultation/collaboration.   Alvy Alsop, MD 02/04/12 1623 

## 2012-02-04 NOTE — ED Provider Notes (Signed)
History     CSN: [id number]  Arrival date & time 02/04/12  7829   First MD Initiated Contact with Patient 02/04/12 [phone]      Chief Complaint  Patient presents with  . Chest Pain    (Consider location/radiation/quality/duration/timing/severity/associated sxs/prior treatment) HPI  Past Medical History  Diagnosis Date  . Endometriosis   . Type 2 diabetes mellitus   . Anemia   . BV (bacterial vaginosis)   . Hepatic steatosis   . Arthritis   . Asthma   . IBS (irritable bowel syndrome)   . Obesity   . Chronic pelvic pain in female   . Hashimoto thyroiditis   . OSA (obstructive sleep apnea)   . Anxiety disorder   . SVT (supraventricular tachycardia)   . Diabetes mellitus   . IC (interstitial cystitis)     Past Surgical History  Procedure Date  . Total abdominal hysterectomy 2004  . Tubal ligation 2001  . Shoulder surgery 2008    right  . Wrist surgery 2008    right  . Cystostomy w/ bladder biopsy   . Bladder staple     Family History  Problem Relation Age of Onset  . Alcohol abuse    . Arthritis Maternal Grandmother   . Diabetes Daughter     type 2  . Hypertension    . Hyperlipidemia    . Kidney disease    . Colon cancer      uncle  . Prostate cancer      uncles and grandfather  . Cirrhosis      both grandmother and grandfather  . Lung cancer    . Heart disease    . Thyroid disease    . Colon polyps    . Liver disease      History  Substance Use Topics  . Smoking status: Never Smoker   . Smokeless tobacco: Never Used  . Alcohol Use: No    OB History    Grav Para Term Preterm Abortions TAB SAB Ect Mult Living   4 3 3  0 1 0 1 0 0 3      Review of Systems  Allergies  Amoxicillin and Penicillins  Home Medications   Current Outpatient Rx  Name Route Sig Dispense Refill  . GLIPIZIDE ER 10 MG PO TB24 Oral Take 10 mg by mouth daily. Pt not compliant because of financial    . LUBIPROSTONE 8 MCG PO CAPS Oral Take 8 mcg by mouth daily with  breakfast. Non compliant because of financial  reasons    . NEBIVOLOL HCL 5 MG PO TABS Oral Take 5 mg by mouth daily. Non compliant because of financial reasons    . PENTOSAN POLYSULFATE SODIUM 100 MG PO CAPS Oral Take 100 mg by mouth 2 (two) times daily.      Marland Kitchen URELLE 81 MG PO TABS Oral Take 1 tablet by mouth every 6 (six) hours as needed.        BP 112/70  Pulse 64  Temp(Src) 97.8 F (36.6 C) (Oral)  Resp 18  Ht 5' (1.524 m)  Wt 190 lb (86.183 kg)  BMI 37.11 kg/m2  SpO2 98%  Physical Exam  ED Course  Procedures (including critical care time)  Labs Reviewed  BASIC METABOLIC PANEL - Abnormal; Notable for the following:    Glucose, Bld 140 (*)    All other components within normal limits  D-DIMER, QUANTITATIVE - Abnormal; Notable for the following:    D-Dimer, Quant 0.86 (*)  All other components within normal limits  CBC  TROPONIN I   Dg Chest 2 View  02/04/2012  *RADIOLOGY REPORT*  Clinical Data: Chest pain, shortness of breath, tachycardia, arrhythmia  CHEST - 2 VIEW  Comparison: 09/11/2011  Findings: Upper-normal size of cardiac silhouette. Mediastinal contours and pulmonary vascularity normal. Minimal peribronchial thickening. No infiltrate, pleural effusion, or pneumothorax. Bones unremarkable.  IMPRESSION: Minimal bronchitic changes.  Original Report Authenticated By: Lollie Marrow, M.D.   US Venous Img Lower Bilateral  02/04/2012  *RADIOLOGY REPORT*  Clinical Data: CHEST PAIN;;  BILATERAL LOWER EXTREMITY VENOUS DUPLEX ULTRASOUND  Technique: Gray-scale sonography with compression, as well as color and duplex ultrasound, were performed to evaluate the deep venous system from the level of the common femoral vein through the popliteal and proximal calf veins.  Comparison: None  Findings:  Normal compressibility and normal Doppler signal within the common femoral, superficial femoral and popliteal veins, down to the proximal calf veins.  No grayscale filling defects to suggest  DVT.  IMPRESSION: No evidence of bilateral lower extremity deep vein thrombosis.  Original Report Authenticated By: Cyndie Chime, M.D.     1. Chest pain    3:21 PM Patient seen and examined. Patient from [facility] for CT angiogram of chest to rule-out PE. She is having mid-chest pain, SOB, lightheadedness, nausea. D-dimer elevated.   Vital signs reviewed and are as follows: Filed Vitals:   02/04/12 1448  BP: 112/70  Pulse: 64  Temp: 97.8 F (36.6 C)  Resp:    3:24 PM Exam:  Gen NAD; Chest Reproducible tenderness to palpation of sternum, RRR, nml S1,S2, no m/r/g; Lungs CTAB; Abd soft, NT, no rebound or guarding; Ext 2+ pedal pulses bilaterally, no edema.  Handoff to [facility] NP who will follow-up on CT results.   Plan: If positive, treat and admit. If negative, d/c home and treat for chest wall pain.   MDM  Pending CT angio to r/o PE.         Renne Crigler, [place] 02/04/12 1527

## 2012-02-04 NOTE — ED Notes (Addendum)
Pt endorses continued dizziness.  NP notified.

## 2012-02-04 NOTE — Discharge Instructions (Signed)
Get plenty of rest. Drink a lot of fluids. See the Dr. of your choice if not better in 2-3 days.      Anxiety and Panic Attacks Anxiety is your body's way of reacting to real danger or something you think is a danger. It may be fear or worry over a situation like losing your job. Sometimes the cause is not known. A panic attack is made up of physical signs like sweating, shaking, or chest pain. Anxiety and panic attacks may start suddenly. They may be strong. They may come at any time of day, even while sleeping. They may come at any time of life. Panic attacks are scary, but they do not harm you physically.  HOME CARE  Avoid any known causes of your anxiety.   Try to relax. Yoga may help. Tell yourself everything will be okay.   Exercise often.   Get expert advice and help (therapy) to stop anxiety or attacks from happening.   Avoid caffeine, alcohol, and drugs.   Only take medicine as told by your doctor.  GET HELP RIGHT AWAY IF:  Your attacks seem different than normal attacks.   Your problems are getting worse or concern you.  MAKE SURE YOU:  Understand these instructions.   Will watch your condition.   Will get help right away if you are not doing well or get worse.  Document Released: 10/13/2010 Document Revised: 08/30/2011 Document Reviewed: 10/13/2010 ExitCare Patient Information 2012 ExitCare, LLCChest Pain (Nonspecific) Chest pain has many causes. Your pain could be caused by something serious, such as a heart attack or a blood clot in the lungs. It could also be caused by something less serious, such as a chest bruise or a virus. Follow up with your doctor. More lab tests or other studies may be needed to find the cause of your pain. Most of the time, nonspecific chest pain will improve within 2 to 3 days of rest and mild pain medicine. HOME CARE  For chest bruises, you may put ice on the sore area for 15 to 20 minutes, 3 to 4 times a day. Do this only if it  makes you or your child feel better.   Put ice in a plastic bag.   Place a towel between the skin and the bag.   Rest for the next 2 to 3 days.   Go back to work if the pain improves.   See your doctor if the pain lasts longer than 1 to 2 weeks.   Only take medicine as told by your doctor.   Quit smoking if you smoke.  GET HELP RIGHT AWAY IF:   There is more pain or pain that spreads to the arm, neck, jaw, back, or belly (abdomen).   You or your child has shortness of breath.   You or your child coughs more than usual or coughs up blood.   You or your child has very bad back or belly pain, feels sick to his or her stomach (nauseous), or throws up (vomits).   You or your child has very bad weakness.   You or your child passes out (faints).   You or your child has a temperature by mouth above 102 F (38.9 C), not controlled by medicine.  Any of these problems may be serious and may be an emergency. Do not wait to see if the problems will go away. Get medical help right away. Call your local emergency services 911 in [place] Do not  drive yourself to the hospital. MAKE SURE YOU:   Understand these instructions.   Will watch this condition.   Will get help right away if you or your child is not doing well or gets worse.  Document Released: 02/27/2008 Document Revised: 08/30/2011 Document Reviewed: 02/27/2008 [facility] Patient Information 2012 [place], [place].Marland Kitchen   RESOURCE GUIDE  Dental Problems  Patients with Medicaid: [facility] [phone]                                           [phone] [address] Phone:  [phone]                                                  Phone:  [phone]  If unable to pay or uninsured, contact:  [facility] or [facility]. to become qualified for the adult dental clinic.  Chronic Pain Problems Contact [facility]  [phone] Patients need to be  referred by their primary care doctor.  Insufficient Money for Medicine Contact [employer]:  call "211" or [facility] [phone] 153-4896.  No Primary Care Doctor Call [facility]  [phone] Other agencies that provide inexpensive medical care    [facility]  [phone]    [facility]  [phone]    [facility]  [phone]    [facility]  [phone]    [facility]  [phone]    [facility]  [phone]  Psychological Services [facility]  [phone] [facility] Services  [phone] [facility]   [phone] (emergency services ([phone])  Substance Abuse Resources [facility] Services  2041173469 [facility] [phone] [facility] [phone] Floydene Flock [phone] Residential & Outpatient Substance Abuse Program  [phone]  Abuse/Neglect [facility] Child Abuse Hotline 947 284 4914 [facility] Child Abuse Hotline 760-357-1471 (After Hours)  Emergency Shelter [facility] [phone]  Maternity Homes [facility] at [facility] [place] [facility] 947-592-4884 [facility] [phone]  MRSA Hotline #:   8620589393    [facility] Resources  [facility] [place]     [employer]                          [facility] Dept. [address] [place]                       [address]      [address]  [place]                                                Cristobal Goldmann Phone:  [phone]  Phone:  [phone]                 Phone:  [phone]  [facility] Mental [facility] Phone:  [phone]  [facility] Child Abuse Hotline 574-193-9008 [phone] (After Hours)

## 2012-02-04 NOTE — ED Notes (Signed)
NM and NP at bedside to speak to pt.

## 2012-02-04 NOTE — ED Provider Notes (Signed)
3pm report received from [facility]. Patient awaiting for CT of the chest to rule out PE. Received a lot of for pain and became dizzy after that. States that the dizziness started Saturday. 1630 CT of the chest shows no PE. Patient will be discharged home to followup with her primary care doctor. The pain is primarily chest wall pain with palpitation. Advised her to start anti-inflammatories after her procedure tomorrow at [facility] to dilate her esophagus. Patient agrees with plan and is ready for discharge. Labs Reviewed  BASIC METABOLIC PANEL - Abnormal; Notable for the following:    Glucose, Bld 140 (*)    All other components within normal limits  D-DIMER, QUANTITATIVE - Abnormal; Notable for the following:    D-Dimer, Quant 0.86 (*)    All other components within normal limits  GLUCOSE, CAPILLARY - Abnormal; Notable for the following:    Glucose-Capillary 100 (*)    All other components within normal limits  CBC  TROPONIN I    Remi Haggard, NP 02/04/12 1639

## 2012-02-04 NOTE — ED Notes (Signed)
Pt sitting in stretcher speaking on phone.  Pt states she feels a little less dizzy at this time.  IV fluids have infused about 300 ml.  Will reevaluate after IV fluids infused.

## 2012-02-04 NOTE — ED Notes (Signed)
Pt states she is diabetic and out of her meds states she has lost her medicaid coverage and cannot afford her medications. Joann with Pharmacy spoke with patient about a program with [facility] that provides some necessary medications free of charge if she is interested in looking into that program. Pt verbalizes understanding pt reports she hasnt taken meds to control blood sugar " in a good many months"

## 2012-02-05 MED ORDER — ONDANSETRON 4 MG PO TBDP
ORAL_TABLET | ORAL | Status: AC
  Filled 2012-02-05: qty 1

## 2012-02-05 NOTE — ED Provider Notes (Signed)
Medical screening examination/treatment/procedure(s) were performed by non-physician practitioner and as supervising physician I was immediately available for consultation/collaboration.   Yolanda Dockendorf, MD 02/05/12 1345 

## 2012-03-09 DIAGNOSIS — R000 Tachycardia, unspecified: Secondary | ICD-10-CM | POA: Insufficient documentation

## 2012-03-09 DIAGNOSIS — R799 Abnormal finding of blood chemistry, unspecified: Secondary | ICD-10-CM | POA: Insufficient documentation

## 2012-03-09 DIAGNOSIS — G8929 Other chronic pain: Secondary | ICD-10-CM | POA: Insufficient documentation

## 2012-03-28 DIAGNOSIS — N3010 Interstitial cystitis (chronic) without hematuria: Secondary | ICD-10-CM | POA: Insufficient documentation

## 2012-03-28 DIAGNOSIS — M1990 Unspecified osteoarthritis, unspecified site: Secondary | ICD-10-CM | POA: Insufficient documentation

## 2012-03-28 DIAGNOSIS — I519 Heart disease, unspecified: Secondary | ICD-10-CM | POA: Insufficient documentation

## 2012-03-28 DIAGNOSIS — I10 Essential (primary) hypertension: Secondary | ICD-10-CM | POA: Insufficient documentation

## 2012-03-28 DIAGNOSIS — Z8709 Personal history of other diseases of the respiratory system: Secondary | ICD-10-CM | POA: Insufficient documentation

## 2012-03-28 DIAGNOSIS — M109 Gout, unspecified: Secondary | ICD-10-CM | POA: Insufficient documentation

## 2012-05-20 ENCOUNTER — Encounter: Payer: BC Managed Care – PPO | Attending: Internal Medicine | Admitting: Dietician

## 2012-05-20 ENCOUNTER — Encounter: Payer: Self-pay | Admitting: Dietician

## 2012-05-20 VITALS — Ht 60.0 in | Wt 188.3 lb

## 2012-05-20 DIAGNOSIS — Z713 Dietary counseling and surveillance: Secondary | ICD-10-CM | POA: Insufficient documentation

## 2012-05-20 DIAGNOSIS — E118 Type 2 diabetes mellitus with unspecified complications: Secondary | ICD-10-CM

## 2012-05-20 DIAGNOSIS — E119 Type 2 diabetes mellitus without complications: Secondary | ICD-10-CM | POA: Insufficient documentation

## 2012-05-20 NOTE — Patient Instructions (Addendum)
   Follow-up with the [employer]   Consider life-syle changes:  Walking in the water, read labels and use carb counting.   Check glucose one time per day and vary the testing times.  Record and take the records to the MD and [facility] Eiza Canniff visits.   Cut the calories of your soda in half by diluting to 1/2 soda and 1/2 water.  Take your metformin 2 times daily.
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
## 2012-05-26 ENCOUNTER — Encounter: Payer: Self-pay | Admitting: Dietician

## 2012-06-02 ENCOUNTER — Encounter (HOSPITAL_BASED_OUTPATIENT_CLINIC_OR_DEPARTMENT_OTHER): Payer: Self-pay | Admitting: Family Medicine

## 2012-06-02 ENCOUNTER — Emergency Department (HOSPITAL_BASED_OUTPATIENT_CLINIC_OR_DEPARTMENT_OTHER)
Admission: EM | Admit: 2012-06-02 | Discharge: 2012-06-02 | Disposition: A | Discharge status: Home / Self Care | Payer: BC Managed Care – PPO | Attending: Emergency Medicine | Admitting: Emergency Medicine

## 2012-06-02 ENCOUNTER — Emergency Department (HOSPITAL_BASED_OUTPATIENT_CLINIC_OR_DEPARTMENT_OTHER): Payer: BC Managed Care – PPO

## 2012-06-02 DIAGNOSIS — K589 Irritable bowel syndrome without diarrhea: Secondary | ICD-10-CM | POA: Insufficient documentation

## 2012-06-02 DIAGNOSIS — J45909 Unspecified asthma, uncomplicated: Secondary | ICD-10-CM | POA: Insufficient documentation

## 2012-06-02 DIAGNOSIS — M791 Myalgia, unspecified site: Secondary | ICD-10-CM

## 2012-06-02 DIAGNOSIS — IMO0001 Reserved for inherently not codable concepts without codable children: Secondary | ICD-10-CM | POA: Insufficient documentation

## 2012-06-02 DIAGNOSIS — E119 Type 2 diabetes mellitus without complications: Secondary | ICD-10-CM | POA: Insufficient documentation

## 2012-06-02 DIAGNOSIS — Z79899 Other long term (current) drug therapy: Secondary | ICD-10-CM | POA: Insufficient documentation

## 2012-06-02 DIAGNOSIS — R0602 Shortness of breath: Secondary | ICD-10-CM | POA: Insufficient documentation

## 2012-06-02 DIAGNOSIS — Z8739 Personal history of other diseases of the musculoskeletal system and connective tissue: Secondary | ICD-10-CM | POA: Insufficient documentation

## 2012-06-02 LAB — CBC WITH DIFFERENTIAL/PLATELET
Basophils Relative: 1 % (ref 0–1)
Eosinophils Absolute: 0.1 10*3/uL (ref 0.0–0.7)
Eosinophils Relative: 1 % (ref 0–5)
HCT: 35.3 % — ABNORMAL LOW (ref 36.0–46.0)
Hemoglobin: 11.8 g/dL — ABNORMAL LOW (ref 12.0–15.0)
MCH: 29.4 pg (ref 26.0–34.0)
MCHC: 33.4 g/dL (ref 30.0–36.0)
MCV: 87.8 fL (ref 78.0–100.0)
Monocytes Absolute: 0.3 10*3/uL (ref 0.1–1.0)
Monocytes Relative: 7 % (ref 3–12)
Neutrophils Relative %: 51 % (ref 43–77)

## 2012-06-02 LAB — BASIC METABOLIC PANEL
BUN: 9 mg/dL (ref 6–23)
Creatinine, Ser: 0.8 mg/dL (ref 0.50–1.10)
GFR calc Af Amer: >90 mL/min (ref >90)
GFR calc non Af Amer: >90 mL/min (ref >90)

## 2012-06-02 MED ORDER — HYDROCODONE-ACETAMINOPHEN 5-325 MG PO TABS: 2.0000 | ORAL_TABLET | ORAL | Status: AC | PRN

## 2012-06-02 MED ORDER — IOHEXOL 350 MG/ML SOLN
75.0000 mL | Freq: Once | INTRAVENOUS | Status: AC | PRN
  Administered 2012-06-02: 80 mL via INTRAVENOUS
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
## 2012-06-03 NOTE — ED Provider Notes (Signed)
History/physical exam/procedure(s) were performed by non-physician practitioner and as supervising physician I was immediately available for consultation/collaboration. I have reviewed all notes and am in agreement with care and plan.   Glennette Galster S Mazell A[place], MD 06/03/12 1756 

## 2012-06-05 ENCOUNTER — Ambulatory Visit: Payer: BC Managed Care – PPO | Admitting: Licensed Clinical Social Worker

## 2012-06-17 ENCOUNTER — Ambulatory Visit (INDEPENDENT_AMBULATORY_CARE_PROVIDER_SITE_OTHER): Payer: BC Managed Care – PPO | Admitting: Licensed Clinical Social Worker

## 2012-06-17 DIAGNOSIS — F09 Unspecified mental disorder due to known physiological condition: Secondary | ICD-10-CM

## 2012-06-17 DIAGNOSIS — F331 Major depressive disorder, recurrent, moderate: Secondary | ICD-10-CM

## 2012-07-15 ENCOUNTER — Ambulatory Visit: Payer: BC Managed Care – PPO | Admitting: Dietician

## 2012-10-28 DIAGNOSIS — M25569 Pain in unspecified knee: Secondary | ICD-10-CM | POA: Insufficient documentation

## 2012-11-11 ENCOUNTER — Encounter (HOSPITAL_BASED_OUTPATIENT_CLINIC_OR_DEPARTMENT_OTHER): Payer: Self-pay | Admitting: *Deleted

## 2012-11-11 ENCOUNTER — Emergency Department (HOSPITAL_BASED_OUTPATIENT_CLINIC_OR_DEPARTMENT_OTHER): Payer: BC Managed Care – PPO

## 2012-11-11 ENCOUNTER — Emergency Department (HOSPITAL_BASED_OUTPATIENT_CLINIC_OR_DEPARTMENT_OTHER)
Admission: EM | Admit: 2012-11-11 | Discharge: 2012-11-11 | Disposition: A | Discharge status: Home / Self Care | Payer: BC Managed Care – PPO | Attending: Emergency Medicine | Admitting: Emergency Medicine

## 2012-11-11 DIAGNOSIS — R0789 Other chest pain: Secondary | ICD-10-CM | POA: Insufficient documentation

## 2012-11-11 DIAGNOSIS — R5383 Other fatigue: Secondary | ICD-10-CM

## 2012-11-11 DIAGNOSIS — E119 Type 2 diabetes mellitus without complications: Secondary | ICD-10-CM | POA: Insufficient documentation

## 2012-11-11 DIAGNOSIS — G8929 Other chronic pain: Secondary | ICD-10-CM

## 2012-11-11 DIAGNOSIS — R002 Palpitations: Secondary | ICD-10-CM | POA: Insufficient documentation

## 2012-11-11 DIAGNOSIS — Z8679 Personal history of other diseases of the circulatory system: Secondary | ICD-10-CM | POA: Insufficient documentation

## 2012-11-11 DIAGNOSIS — IMO0001 Reserved for inherently not codable concepts without codable children: Secondary | ICD-10-CM | POA: Insufficient documentation

## 2012-11-11 DIAGNOSIS — Z8739 Personal history of other diseases of the musculoskeletal system and connective tissue: Secondary | ICD-10-CM | POA: Insufficient documentation

## 2012-11-11 DIAGNOSIS — Z8719 Personal history of other diseases of the digestive system: Secondary | ICD-10-CM | POA: Insufficient documentation

## 2012-11-11 DIAGNOSIS — Z3202 Encounter for pregnancy test, result negative: Secondary | ICD-10-CM | POA: Insufficient documentation

## 2012-11-11 DIAGNOSIS — Z8742 Personal history of other diseases of the female genital tract: Secondary | ICD-10-CM | POA: Insufficient documentation

## 2012-11-11 DIAGNOSIS — Z8659 Personal history of other mental and behavioral disorders: Secondary | ICD-10-CM | POA: Insufficient documentation

## 2012-11-11 DIAGNOSIS — I10 Essential (primary) hypertension: Secondary | ICD-10-CM | POA: Insufficient documentation

## 2012-11-11 DIAGNOSIS — R079 Chest pain, unspecified: Secondary | ICD-10-CM

## 2012-11-11 DIAGNOSIS — Z8639 Personal history of other endocrine, nutritional and metabolic disease: Secondary | ICD-10-CM | POA: Insufficient documentation

## 2012-11-11 DIAGNOSIS — E669 Obesity, unspecified: Secondary | ICD-10-CM | POA: Insufficient documentation

## 2012-11-11 DIAGNOSIS — J45901 Unspecified asthma with (acute) exacerbation: Secondary | ICD-10-CM | POA: Insufficient documentation

## 2012-11-11 DIAGNOSIS — Z87448 Personal history of other diseases of urinary system: Secondary | ICD-10-CM | POA: Insufficient documentation

## 2012-11-11 DIAGNOSIS — M549 Dorsalgia, unspecified: Secondary | ICD-10-CM | POA: Insufficient documentation

## 2012-11-11 DIAGNOSIS — G4733 Obstructive sleep apnea (adult) (pediatric): Secondary | ICD-10-CM | POA: Insufficient documentation

## 2012-11-11 DIAGNOSIS — R5381 Other malaise: Secondary | ICD-10-CM | POA: Insufficient documentation

## 2012-11-11 DIAGNOSIS — Z862 Personal history of diseases of the blood and blood-forming organs and certain disorders involving the immune mechanism: Secondary | ICD-10-CM | POA: Insufficient documentation

## 2012-11-11 DIAGNOSIS — N949 Unspecified condition associated with female genital organs and menstrual cycle: Secondary | ICD-10-CM | POA: Insufficient documentation

## 2012-11-11 DIAGNOSIS — Z8619 Personal history of other infectious and parasitic diseases: Secondary | ICD-10-CM | POA: Insufficient documentation

## 2012-11-11 DIAGNOSIS — Z79899 Other long term (current) drug therapy: Secondary | ICD-10-CM | POA: Insufficient documentation

## 2012-11-11 HISTORY — DX: Essential (primary) hypertension: I10

## 2012-11-11 LAB — COMPREHENSIVE METABOLIC PANEL
ALT: 52 U/L — ABNORMAL HIGH (ref 0–35)
Calcium: 9.9 mg/dL (ref 8.4–10.5)
Creatinine, Ser: 0.7 mg/dL (ref 0.50–1.10)
GFR calc Af Amer: >90 mL/min (ref >90)
GFR calc non Af Amer: >90 mL/min (ref >90)
Glucose, Bld: 255 mg/dL — ABNORMAL HIGH (ref 70–99)
Sodium: 140 mEq/L (ref 135–145)
Total Protein: 7.7 g/dL (ref 6.0–8.3)

## 2012-11-11 LAB — D-DIMER, QUANTITATIVE: D-Dimer, Quant: 0.54 ug/mL-FEU — ABNORMAL HIGH (ref 0.00–0.48)

## 2012-11-11 LAB — TROPONIN I: Troponin I: <0.3 ng/mL (ref <0.30)

## 2012-11-11 LAB — CBC WITH DIFFERENTIAL/PLATELET
Basophils Absolute: 0 10*3/uL (ref 0.0–0.1)
Eosinophils Absolute: 0.1 10*3/uL (ref 0.0–0.7)
Eosinophils Relative: 2 % (ref 0–5)
HCT: 37.6 % (ref 36.0–46.0)
Lymphs Abs: 2 10*3/uL (ref 0.7–4.0)
MCH: 29.7 pg (ref 26.0–34.0)
MCV: 88.1 fL (ref 78.0–100.0)
Monocytes Absolute: 0.3 10*3/uL (ref 0.1–1.0)
Platelets: 218 10*3/uL (ref 150–400)
RDW: 13.1 % (ref 11.5–15.5)

## 2012-11-11 LAB — URINALYSIS, ROUTINE W REFLEX MICROSCOPIC
Bilirubin Urine: NEGATIVE
Glucose, UA: 100 mg/dL — AB
Hgb urine dipstick: NEGATIVE
Ketones, ur: NEGATIVE mg/dL
Specific Gravity, Urine: 1.026 (ref 1.005–1.030)
pH: 5.5 (ref 5.0–8.0)

## 2012-11-11 MED ORDER — IOHEXOL 350 MG/ML SOLN
100.0000 mL | Freq: Once | INTRAVENOUS | Status: AC | PRN
  Administered 2012-11-11: 100 mL via INTRAVENOUS

## 2012-11-11 MED ORDER — TRAMADOL HCL 50 MG PO TABS: 50.0000 mg | ORAL_TABLET | Freq: Four times a day (QID) | ORAL | Status: DC | PRN

## 2012-11-11 NOTE — ED Notes (Signed)
Pt states that she doesn't  Take any medication at all now, she states she stopped taking all of it because " I have a complicated case and Im tired of taking so many medicines"

## 2012-11-11 NOTE — ED Provider Notes (Signed)
History     CSN: [id number]  Arrival date & time 11/11/12  1478   First MD Initiated Contact with Patient 11/11/12 [phone]      Chief Complaint  Patient presents with  . Chest Pain  . Fatigue    (Consider location/radiation/quality/duration/timing/severity/associated sxs/prior treatment) Patient is a 32 y.o. female presenting with chest pain. The history is provided by the patient.  Chest Pain Associated symptoms: back pain, fatigue, palpitations and shortness of breath   Associated symptoms: no abdominal pain, no fever and no headache    history of chest pain for 2 days nonstop. At times felt like the heart was going fast which be consistent with palpitations. Other symptoms include shortness of breath fatigue and chronic back pain. Chest pain is right-sided radiates to the back and is sharp worse with movement. Patient having difficulty sleeping for 2 weeks.  Past Medical History  Diagnosis Date  . Endometriosis   . Type 2 diabetes mellitus   . Anemia   . BV (bacterial vaginosis)   . Hepatic steatosis   . Arthritis   . Asthma   . IBS (irritable bowel syndrome)   . Obesity   . Chronic pelvic pain in female   . Hashimoto thyroiditis   . OSA (obstructive sleep apnea)   . Anxiety disorder   . SVT (supraventricular tachycardia)   . Diabetes mellitus   . IC (interstitial cystitis)   . Hypertension     Past Surgical History  Procedure Laterality Date  . Total abdominal hysterectomy  2004  . Tubal ligation  2001  . Shoulder surgery  2008    right  . Wrist surgery  2008    right  . Cystostomy w/ bladder biopsy    . Bladder staple      Family History  Problem Relation Age of Onset  . Alcohol abuse    . Arthritis Maternal Grandmother   . Diabetes Daughter     type 2  . Hypertension    . Hyperlipidemia    . Kidney disease    . Colon cancer      uncle  . Prostate cancer      uncles and grandfather  . Cirrhosis      both grandmother and grandfather  . Lung  cancer    . Heart disease    . Thyroid disease    . Colon polyps    . Liver disease      History  Substance Use Topics  . Smoking status: Never Smoker   . Smokeless tobacco: Never Used  . Alcohol Use: No    OB History   Grav Para Term Preterm Abortions TAB SAB Ect Mult Living   4 3 3  0 1 0 1 0 0 3      Review of Systems  Constitutional: Positive for fatigue. Negative for fever.  HENT: Negative for congestion.   Eyes: Negative for redness and visual disturbance.  Respiratory: Positive for chest tightness and shortness of breath.   Cardiovascular: Positive for chest pain and palpitations. Negative for leg swelling.  Gastrointestinal: Negative for abdominal pain.  Genitourinary: Negative for dysuria.  Musculoskeletal: Positive for myalgias and back pain.  Skin: Negative for rash.  Neurological: Negative for headaches.  Hematological: Does not bruise/bleed easily.    Allergies  Amoxicillin and Penicillins  Home Medications   Current Outpatient Rx  Name  Route  Sig  Dispense  Refill  . glipiZIDE (GLUCOTROL XL) 10 MG 24 hr tablet   Oral  Take 10 mg by mouth daily. Pt not compliant because of financial         . lubiprostone (AMITIZA) 8 MCG capsule   Oral   Take 8 mcg by mouth daily with breakfast. Non compliant because of financial  reasons         . metFORMIN (GLUCOPHAGE) 500 MG tablet   Oral   Take 500 mg by mouth 2 (two) times daily with a meal.         . nebivolol (BYSTOLIC) 5 MG tablet   Oral   Take 5 mg by mouth daily. Non compliant because of financial reasons         . pentosan polysulfate (ELMIRON) 100 MG capsule   Oral   Take 100 mg by mouth 2 (two) times daily.           . traMADol (ULTRAM) 50 MG tablet   Oral   Take 1 tablet (50 mg total) by mouth every 6 (six) hours as needed for pain.   20 tablet   0   . URELLE (URELLE/URISED) 81 MG TABS   Oral   Take 1 tablet by mouth every 6 (six) hours as needed.             BP 129/80   Pulse 81  Temp(Src) 98.2 F (36.8 C) (Oral)  Resp 18  Ht 5' (1.524 m)  Wt 188 lb (85.276 kg)  BMI 36.72 kg/m2  SpO2 99%  Physical Exam  Nursing note and vitals reviewed. Constitutional: She is oriented to person, place, and time. She appears well-developed and well-nourished.  HENT:  Head: Normocephalic and atraumatic.  Mouth/Throat: Oropharynx is clear and moist.  Eyes: Conjunctivae and EOM are normal. Pupils are equal, round, and reactive to light.  Neck: Normal range of motion. Neck supple. No thyromegaly present.  Cardiovascular: Normal rate, regular rhythm and normal heart sounds.   No murmur heard. Pulmonary/Chest: Effort normal and breath sounds normal. No respiratory distress. She has no wheezes. She has no rales. She exhibits no tenderness.  Abdominal: Soft. Bowel sounds are normal. There is no tenderness.  Musculoskeletal: Normal range of motion. She exhibits no edema.  Lymphadenopathy:    She has no cervical adenopathy.  Neurological: She is alert and oriented to person, place, and time. No cranial nerve deficit. She exhibits normal muscle tone. Coordination normal.  Skin: Skin is warm. No rash noted.    ED Course  Procedures (including critical care time)  Labs Reviewed  GLUCOSE, CAPILLARY - Abnormal; Notable for the following:    Glucose-Capillary 261 (*)    All other components within normal limits  COMPREHENSIVE METABOLIC PANEL - Abnormal; Notable for the following:    Glucose, Bld 255 (*)    AST 48 (*)    ALT 52 (*)    Total Bilirubin 0.2 (*)    All other components within normal limits  D-DIMER, QUANTITATIVE - Abnormal; Notable for the following:    D-Dimer, Quant 0.54 (*)    All other components within normal limits  URINALYSIS, ROUTINE W REFLEX MICROSCOPIC - Abnormal; Notable for the following:    Glucose, UA 100 (*)    All other components within normal limits  URINE CULTURE  CBC WITH DIFFERENTIAL  LIPASE, BLOOD  TROPONIN I  PREGNANCY, URINE    Dg Chest 2 View  11/11/2012  *RADIOLOGY REPORT*  Clinical Data: Chest pain and fatigue  CHEST - 2 VIEW  Comparison: Feb 04, 2012  Findings: Lungs clear.  Heart  size and pulmonary vascularity are normal.  No adenopathy.  No pneumothorax.  No bone lesions.  IMPRESSION: No abnormality noted.   Original Report Authenticated By: Bretta Bang, M.D.    Ct Angio Chest Pe W/cm &/or Wo Cm  11/11/2012  *RADIOLOGY REPORT*  Clinical Data: Right-sided chest pain and fatigue.  CT ANGIOGRAPHY CHEST  Technique:  Multidetector CT imaging of the chest using the standard protocol during bolus administration of intravenous contrast. Multiplanar reconstructed images including MIPs were obtained and reviewed to evaluate the vascular anatomy.  Contrast: OMNIPAQUE IOHEXOL 350 MG/ML SOLN  Comparison: Multiple prior chest CTs.  Findings: The chest wall is unremarkable and stable.  The bony thorax is intact.  The heart is normal in size.  No pericardial effusion.  Stable thymic tissue noted in the anterior mediastinum.  The aorta is normal in caliber.  No dissection.  The esophagus is grossly normal.  Poor opacification of the pulmonary arteries.  Most of the contrast is in the aorta and pulmonary veins.  No obvious large central pulmonary emboli.  No acute pulmonary findings.  No pleural effusion.  The upper abdomen demonstrates severe diffuse fatty infiltration of the liver.  IMPRESSION:  1.  Suboptimal opacification of the pulmonary arteries but no definite pulmonary emboli. 2.  Normal thoracic aorta. 3.  No acute pulmonary findings.   Original Report Authenticated By: Rudie Meyer, M.D.    Results for orders placed during the hospital encounter of 11/11/12  GLUCOSE, CAPILLARY      Result Value Range   Glucose-Capillary 261 (*) 70 - 99 mg/dL  CBC WITH DIFFERENTIAL      Result Value Range   WBC 4.6  4.0 - 10.5 K/uL   RBC 4.27  3.87 - 5.11 MIL/uL   Hemoglobin 12.7  12.0 - 15.0 g/dL   HCT 95.2  84.1 - 32.4 %   MCV  88.1  78.0 - 100.0 fL   MCH 29.7  26.0 - 34.0 pg   MCHC 33.8  30.0 - 36.0 g/dL   RDW 40.1  02.7 - 25.3 %   Platelets 218  150 - 400 K/uL   Neutrophils Relative 47  43 - 77 %   Neutro Abs 2.1  1.7 - 7.7 K/uL   Lymphocytes Relative 44  12 - 46 %   Lymphs Abs 2.0  0.7 - 4.0 K/uL   Monocytes Relative 6  3 - 12 %   Monocytes Absolute 0.3  0.1 - 1.0 K/uL   Eosinophils Relative 2  0 - 5 %   Eosinophils Absolute 0.1  0.0 - 0.7 K/uL   Basophils Relative 0  0 - 1 %   Basophils Absolute 0.0  0.0 - 0.1 K/uL  COMPREHENSIVE METABOLIC PANEL      Result Value Range   Sodium 140  135 - 145 mEq/L   Potassium 3.6  3.5 - 5.1 mEq/L   Chloride 104  96 - 112 mEq/L   CO2 26  19 - 32 mEq/L   Glucose, Bld 255 (*) 70 - 99 mg/dL   BUN 10  6 - 23 mg/dL   Creatinine, Ser 6.64  0.50 - 1.10 mg/dL   Calcium 9.9  8.4 - 40.3 mg/dL   Total Protein 7.7  6.0 - 8.3 g/dL   Albumin 3.9  3.5 - 5.2 g/dL   AST 48 (*) 0 - 37 U/L   ALT 52 (*) 0 - 35 U/L   Alkaline Phosphatase 48  39 - 117 U/L  Total Bilirubin 0.2 (*) 0.3 - 1.2 mg/dL   GFR calc non Af Amer >90  >90 mL/min   GFR calc Af Amer >90  >90 mL/min  LIPASE, BLOOD      Result Value Range   Lipase 18  11 - 59 U/L  TROPONIN I      Result Value Range   Troponin I <0.30  <0.30 ng/mL  D-DIMER, QUANTITATIVE      Result Value Range   D-Dimer, Quant 0.54 (*) 0.00 - 0.48 ug/mL-FEU  PREGNANCY, URINE      Result Value Range   Preg Test, Ur NEGATIVE  NEGATIVE  URINALYSIS, ROUTINE W REFLEX MICROSCOPIC      Result Value Range   Color, Urine YELLOW  YELLOW   APPearance CLEAR  CLEAR   Specific Gravity, Urine 1.026  1.005 - 1.030   pH 5.5  5.0 - 8.0   Glucose, UA 100 (*) NEGATIVE mg/dL   Hgb urine dipstick NEGATIVE  NEGATIVE   Bilirubin Urine NEGATIVE  NEGATIVE   Ketones, ur NEGATIVE  NEGATIVE mg/dL   Protein, ur NEGATIVE  NEGATIVE mg/dL   Urobilinogen, UA 0.2  0.0 - 1.0 mg/dL   Nitrite NEGATIVE  NEGATIVE   Leukocytes, UA NEGATIVE  NEGATIVE    Date: 11/11/2012   Rate: 70  Rhythm: normal sinus rhythm  QRS Axis: normal  Intervals: normal  ST/T Wave abnormalities: nonspecific T wave changes  Conduction Disutrbances:none  Narrative Interpretation:   Old EKG Reviewed: unchanged EKG is unchanged from 06/02/2012   1. Chest pain   2. Fatigue   3. Chronic back pain       MDM   Patient's workup for the chest pain and fatigue was negative. Patient has a history of chronic back pain which has been acting up as well. Recommend patient followup with her primary care Dr. for recheck of thyroid view of the fatigue. Chest pain workup was negative d-dimer was elevated CT angios chest was negative for any pulmonary embolism EKG had no acute changes on that cardiac marker was negative. Patient has had the symptoms of 2 days so single cardiac marker being negative for significant.        Shelda Jakes, MD 11/11/12 1228

## 2012-11-11 NOTE — ED Notes (Signed)
Pt states has been having leg aches and severe fatigue and shortness of breath has had this several times before per pt. 2 days ago started getting worse fatigue and pain in center of her chest.

## 2012-11-11 NOTE — ED Notes (Signed)
Patient transported to CT 

## 2012-11-12 LAB — URINE CULTURE: Colony Count: NO GROWTH

## 2012-12-18 ENCOUNTER — Other Ambulatory Visit: Payer: Self-pay | Admitting: Obstetrics and Gynecology

## 2012-12-18 DIAGNOSIS — N63 Unspecified lump in unspecified breast: Secondary | ICD-10-CM

## 2012-12-31 ENCOUNTER — Ambulatory Visit
Admission: RE | Admit: 2012-12-31 | Discharge: 2012-12-31 | Disposition: A | Discharge status: Home / Self Care | Payer: BC Managed Care – PPO | Source: Ambulatory Visit | Attending: Obstetrics and Gynecology | Admitting: Obstetrics and Gynecology

## 2012-12-31 DIAGNOSIS — N63 Unspecified lump in unspecified breast: Secondary | ICD-10-CM

## 2013-01-31 ENCOUNTER — Encounter (HOSPITAL_BASED_OUTPATIENT_CLINIC_OR_DEPARTMENT_OTHER): Payer: Self-pay | Admitting: *Deleted

## 2013-01-31 ENCOUNTER — Emergency Department (HOSPITAL_BASED_OUTPATIENT_CLINIC_OR_DEPARTMENT_OTHER): Payer: BC Managed Care – PPO

## 2013-01-31 ENCOUNTER — Emergency Department (HOSPITAL_BASED_OUTPATIENT_CLINIC_OR_DEPARTMENT_OTHER)
Admission: EM | Admit: 2013-01-31 | Discharge: 2013-01-31 | Disposition: A | Discharge status: Home / Self Care | Payer: BC Managed Care – PPO | Attending: Emergency Medicine | Admitting: Emergency Medicine

## 2013-01-31 ENCOUNTER — Other Ambulatory Visit: Payer: Self-pay

## 2013-01-31 DIAGNOSIS — F411 Generalized anxiety disorder: Secondary | ICD-10-CM | POA: Insufficient documentation

## 2013-01-31 DIAGNOSIS — Z8719 Personal history of other diseases of the digestive system: Secondary | ICD-10-CM | POA: Insufficient documentation

## 2013-01-31 DIAGNOSIS — M25551 Pain in right hip: Secondary | ICD-10-CM

## 2013-01-31 DIAGNOSIS — Z880 Allergy status to penicillin: Secondary | ICD-10-CM | POA: Insufficient documentation

## 2013-01-31 DIAGNOSIS — I10 Essential (primary) hypertension: Secondary | ICD-10-CM | POA: Insufficient documentation

## 2013-01-31 DIAGNOSIS — Z862 Personal history of diseases of the blood and blood-forming organs and certain disorders involving the immune mechanism: Secondary | ICD-10-CM | POA: Insufficient documentation

## 2013-01-31 DIAGNOSIS — M25559 Pain in unspecified hip: Secondary | ICD-10-CM | POA: Insufficient documentation

## 2013-01-31 DIAGNOSIS — R109 Unspecified abdominal pain: Secondary | ICD-10-CM | POA: Insufficient documentation

## 2013-01-31 DIAGNOSIS — E669 Obesity, unspecified: Secondary | ICD-10-CM | POA: Insufficient documentation

## 2013-01-31 DIAGNOSIS — Z87448 Personal history of other diseases of urinary system: Secondary | ICD-10-CM | POA: Insufficient documentation

## 2013-01-31 DIAGNOSIS — Z8639 Personal history of other endocrine, nutritional and metabolic disease: Secondary | ICD-10-CM | POA: Insufficient documentation

## 2013-01-31 DIAGNOSIS — J45909 Unspecified asthma, uncomplicated: Secondary | ICD-10-CM | POA: Insufficient documentation

## 2013-01-31 DIAGNOSIS — G8929 Other chronic pain: Secondary | ICD-10-CM | POA: Insufficient documentation

## 2013-01-31 DIAGNOSIS — Z8669 Personal history of other diseases of the nervous system and sense organs: Secondary | ICD-10-CM | POA: Insufficient documentation

## 2013-01-31 DIAGNOSIS — Z8679 Personal history of other diseases of the circulatory system: Secondary | ICD-10-CM | POA: Insufficient documentation

## 2013-01-31 DIAGNOSIS — M549 Dorsalgia, unspecified: Secondary | ICD-10-CM | POA: Insufficient documentation

## 2013-01-31 DIAGNOSIS — Z79899 Other long term (current) drug therapy: Secondary | ICD-10-CM | POA: Insufficient documentation

## 2013-01-31 DIAGNOSIS — Z8739 Personal history of other diseases of the musculoskeletal system and connective tissue: Secondary | ICD-10-CM | POA: Insufficient documentation

## 2013-01-31 DIAGNOSIS — E119 Type 2 diabetes mellitus without complications: Secondary | ICD-10-CM | POA: Insufficient documentation

## 2013-01-31 DIAGNOSIS — Z8742 Personal history of other diseases of the female genital tract: Secondary | ICD-10-CM | POA: Insufficient documentation

## 2013-01-31 LAB — COMPREHENSIVE METABOLIC PANEL
Alkaline Phosphatase: 47 U/L (ref 39–117)
BUN: 10 mg/dL (ref 6–23)
Creatinine, Ser: 0.8 mg/dL (ref 0.50–1.10)
GFR calc Af Amer: >90 mL/min (ref >90)
Glucose, Bld: 150 mg/dL — ABNORMAL HIGH (ref 70–99)
Potassium: 3.8 mEq/L (ref 3.5–5.1)
Total Protein: 7.7 g/dL (ref 6.0–8.3)

## 2013-01-31 LAB — CBC WITH DIFFERENTIAL/PLATELET
Eosinophils Absolute: 0.1 10*3/uL (ref 0.0–0.7)
Eosinophils Relative: 2 % (ref 0–5)
HCT: 38.8 % (ref 36.0–46.0)
Hemoglobin: 13.1 g/dL (ref 12.0–15.0)
Lymphs Abs: 2.3 10*3/uL (ref 0.7–4.0)
MCH: 30 pg (ref 26.0–34.0)
MCV: 88.8 fL (ref 78.0–100.0)
Monocytes Absolute: 0.4 10*3/uL (ref 0.1–1.0)
Monocytes Relative: 7 % (ref 3–12)
Neutrophils Relative %: 47 % (ref 43–77)
RBC: 4.37 MIL/uL (ref 3.87–5.11)

## 2013-01-31 LAB — URINALYSIS, ROUTINE W REFLEX MICROSCOPIC
Bilirubin Urine: NEGATIVE
Ketones, ur: NEGATIVE mg/dL
Nitrite: NEGATIVE
Specific Gravity, Urine: 1.026 (ref 1.005–1.030)
Urobilinogen, UA: 0.2 mg/dL (ref 0.0–1.0)

## 2013-01-31 LAB — LIPASE, BLOOD: Lipase: 14 U/L (ref 11–59)

## 2013-01-31 MED ORDER — LORAZEPAM 2 MG/ML IJ SOLN
1.0000 mg | Freq: Once | INTRAMUSCULAR | Status: AC
  Administered 2013-01-31: 1 mg via INTRAVENOUS
  Filled 2013-01-31: qty 1

## 2013-01-31 MED ORDER — OXYCODONE-ACETAMINOPHEN 5-325 MG PO TABS: 2.0000 | ORAL_TABLET | ORAL | Status: DC | PRN

## 2013-01-31 MED ORDER — KETOROLAC TROMETHAMINE 30 MG/ML IJ SOLN
30.0000 mg | Freq: Once | INTRAMUSCULAR | Status: AC
  Administered 2013-01-31: 30 mg via INTRAVENOUS
  Filled 2013-01-31: qty 1

## 2013-01-31 MED ORDER — HYDROMORPHONE HCL PF 1 MG/ML IJ SOLN
1.0000 mg | Freq: Once | INTRAMUSCULAR | Status: AC
  Administered 2013-01-31: 1 mg via INTRAVENOUS
  Filled 2013-01-31: qty 1

## 2013-01-31 MED ORDER — SODIUM CHLORIDE 0.9 % IV SOLN
INTRAVENOUS | Status: DC
  Administered 2013-01-31: 11:00:00 via INTRAVENOUS

## 2013-01-31 NOTE — ED Provider Notes (Signed)
History     CSN: [id number]  Arrival date & time 01/31/13  4098   First MD Initiated Contact with Patient 01/31/13 [phone]      Chief Complaint  Patient presents with  . Chest Pain  . Back Pain    (Consider location/radiation/quality/duration/timing/severity/associated sxs/prior treatment) Patient is a 32 y.o. female presenting with chest pain and back pain. The history is provided by the patient.  Chest Pain Associated symptoms: back pain   Back Pain Associated symptoms: chest pain    patient here with right-sided flank pain characterized as throbbing and constant x3 days. Pain radiates down to her right leg and is worse when she walks. History of similar symptoms in the past. Also notes some epigastric pain radiating to her back which does not have any anginal type quality. No fever or chills. No vomiting or diarrhea. Similar symptoms have been relieved with Toradol  Denies any rashes. The patient denies any hematuria or dysuria. No change in bowel or bladder function.  Past Medical History  Diagnosis Date  . Endometriosis   . Type 2 diabetes mellitus   . Anemia   . BV (bacterial vaginosis)   . Hepatic steatosis   . Arthritis   . Asthma   . IBS (irritable bowel syndrome)   . Obesity   . Chronic pelvic pain in female   . Hashimoto thyroiditis   . OSA (obstructive sleep apnea)   . Anxiety disorder   . SVT (supraventricular tachycardia)   . Diabetes mellitus   . IC (interstitial cystitis)   . Hypertension     Past Surgical History  Procedure Laterality Date  . Total abdominal hysterectomy  2004  . Tubal ligation  2001  . Shoulder surgery  2008    right  . Wrist surgery  2008    right  . Cystostomy w/ bladder biopsy    . Bladder staple      Family History  Problem Relation Age of Onset  . Alcohol abuse    . Arthritis Maternal Grandmother   . Diabetes Daughter     type 2  . Hypertension    . Hyperlipidemia    . Kidney disease    . Colon cancer      uncle    . Prostate cancer      uncles and grandfather  . Cirrhosis      both grandmother and grandfather  . Lung cancer    . Heart disease    . Thyroid disease    . Colon polyps    . Liver disease      History  Substance Use Topics  . Smoking status: Never Smoker   . Smokeless tobacco: Never Used  . Alcohol Use: No    OB History   Grav Para Term Preterm Abortions TAB SAB Ect Mult Living   4 3 3  0 1 0 1 0 0 3      Review of Systems  Cardiovascular: Positive for chest pain.  Musculoskeletal: Positive for back pain.  All other systems reviewed and are negative.    Allergies  Amoxicillin and Penicillins  Home Medications   Current Outpatient Rx  Name  Route  Sig  Dispense  Refill  . glipiZIDE (GLUCOTROL XL) 10 MG 24 hr tablet   Oral   Take 10 mg by mouth daily. Pt not compliant because of financial         . lubiprostone (AMITIZA) 8 MCG capsule   Oral   Take 8 mcg  by mouth daily with breakfast. Non compliant because of financial  reasons         . metFORMIN (GLUCOPHAGE) 500 MG tablet   Oral   Take 500 mg by mouth 2 (two) times daily with a meal.         . nebivolol (BYSTOLIC) 5 MG tablet   Oral   Take 5 mg by mouth daily. Non compliant because of financial reasons         . pentosan polysulfate (ELMIRON) 100 MG capsule   Oral   Take 100 mg by mouth 2 (two) times daily.           . traMADol (ULTRAM) 50 MG tablet   Oral   Take 1 tablet (50 mg total) by mouth every 6 (six) hours as needed for pain.   20 tablet   0   . URELLE (URELLE/URISED) 81 MG TABS   Oral   Take 1 tablet by mouth every 6 (six) hours as needed.             BP 131/85  Pulse 92  Temp(Src) 98 F (36.7 C) (Oral)  Resp 18  SpO2 96%  Physical Exam  Nursing note and vitals reviewed. Constitutional: She is oriented to person, place, and time. She appears well-developed and well-nourished.  Non-toxic appearance. No distress.  HENT:  Head: Normocephalic and atraumatic.  Eyes:  Conjunctivae, EOM and lids are normal. Pupils are equal, round, and reactive to light.  Neck: Normal range of motion. Neck supple. No tracheal deviation present. No mass present.  Cardiovascular: Normal rate, regular rhythm and normal heart sounds.  Exam reveals no gallop.   No murmur heard. Pulmonary/Chest: Effort normal and breath sounds normal. No stridor. No respiratory distress. She has no decreased breath sounds. She has no wheezes. She has no rhonchi. She has no rales.  Abdominal: Soft. Normal appearance and bowel sounds are normal. She exhibits no distension. There is no tenderness. There is no rebound and no CVA tenderness.  Musculoskeletal: Normal range of motion. She exhibits no edema and no tenderness.       Legs: Neurological: She is alert and oriented to person, place, and time. She has normal strength. No cranial nerve deficit or sensory deficit. GCS eye subscore is 4. GCS verbal subscore is 5. GCS motor subscore is 6.  Skin: Skin is warm and dry. No abrasion and no rash noted.  Psychiatric: She has a normal mood and affect. Her speech is normal and behavior is normal.    ED Course  Procedures (including critical care time)  Labs Reviewed - No data to display No results found.   No diagnosis found.    MDM   Date: 01/31/2013  Rate: 73  Rhythm: normal sinus rhythm  QRS Axis: normal  Intervals: normal  ST/T Wave abnormalities: normal  Conduction Disutrbances:none  Narrative Interpretation:   Old EKG Reviewed: unchanged   12:46 PM Patient given pain meds here feels better. She has no signs of neurological deficits at this time. Blood work and x-rays are negative. Suspect musculoskeletal processes she is stable for discharge       Toy Baker, MD 01/31/13 1247

## 2013-01-31 NOTE — ED Notes (Signed)
Phone call from [facility] r/t N/V she was informed that if she felt that she needed our help that we are available to assist her.

## 2013-01-31 NOTE — ED Notes (Addendum)
Patient c/o chronic back pain, since Thursday has been experiencing shooting/stabbing pain down her right leg and intermittent chest pain. Fell on the stairs last week. States that her  R leg has been feeling cold.no SOB, no n/v., no meds taken. Had been given a heart monitor by a cardiologist, but she quit wearing it because the stickers bothered her. She stated she is not taking any of her prescribed medicines because she does not have a doctor at this time

## 2013-01-31 NOTE — ED Notes (Signed)
Call from Ms Lisa Cole r/t N/V post discharge today pt informed that if she felt a need for out help that we are here 24hrs / 7 days a week and we would be more than willing to help were we can with her condition.

## 2013-02-04 DIAGNOSIS — N3946 Mixed incontinence: Secondary | ICD-10-CM | POA: Insufficient documentation

## 2013-02-04 DIAGNOSIS — M797 Fibromyalgia: Secondary | ICD-10-CM | POA: Insufficient documentation

## 2013-02-04 DIAGNOSIS — R350 Frequency of micturition: Secondary | ICD-10-CM | POA: Insufficient documentation

## 2013-03-14 ENCOUNTER — Inpatient Hospital Stay (HOSPITAL_COMMUNITY): Payer: BC Managed Care – PPO

## 2013-03-14 ENCOUNTER — Encounter (HOSPITAL_COMMUNITY): Payer: Self-pay | Admitting: *Deleted

## 2013-03-14 ENCOUNTER — Inpatient Hospital Stay (HOSPITAL_COMMUNITY)
Admission: AD | Admit: 2013-03-14 | Discharge: 2013-03-14 | Disposition: A | Discharge status: Home / Self Care | Payer: BC Managed Care – PPO | Source: Ambulatory Visit | Attending: Obstetrics and Gynecology | Admitting: Obstetrics and Gynecology

## 2013-03-14 DIAGNOSIS — K5900 Constipation, unspecified: Secondary | ICD-10-CM | POA: Insufficient documentation

## 2013-03-14 DIAGNOSIS — K7689 Other specified diseases of liver: Secondary | ICD-10-CM | POA: Insufficient documentation

## 2013-03-14 DIAGNOSIS — R1013 Epigastric pain: Secondary | ICD-10-CM | POA: Insufficient documentation

## 2013-03-14 LAB — COMPREHENSIVE METABOLIC PANEL
BUN: 11 mg/dL (ref 6–23)
CO2: 24 mEq/L (ref 19–32)
Calcium: 10 mg/dL (ref 8.4–10.5)
Creatinine, Ser: 0.76 mg/dL (ref 0.50–1.10)
GFR calc Af Amer: >90 mL/min (ref >90)
GFR calc non Af Amer: >90 mL/min (ref >90)
Glucose, Bld: 144 mg/dL — ABNORMAL HIGH (ref 70–99)
Sodium: 138 mEq/L (ref 135–145)
Total Protein: 7.5 g/dL (ref 6.0–8.3)

## 2013-03-14 LAB — URINALYSIS, ROUTINE W REFLEX MICROSCOPIC
Bilirubin Urine: NEGATIVE
Ketones, ur: NEGATIVE mg/dL
Nitrite: NEGATIVE
Urobilinogen, UA: 0.2 mg/dL (ref 0.0–1.0)

## 2013-03-14 LAB — CBC
MCH: 29.6 pg (ref 26.0–34.0)
MCV: 87.1 fL (ref 78.0–100.0)
Platelets: 208 10*3/uL (ref 150–400)
RDW: 13.1 % (ref 11.5–15.5)
WBC: 5.2 10*3/uL (ref 4.0–10.5)

## 2013-03-14 LAB — LIPASE, BLOOD: Lipase: 14 U/L (ref 11–59)

## 2013-03-14 MED ORDER — GI COCKTAIL ~~LOC~~
30.0000 mL | Freq: Once | ORAL | Status: AC
  Administered 2013-03-14: 30 mL via ORAL
  Filled 2013-03-14: qty 30

## 2013-03-14 MED ORDER — OXYCODONE-ACETAMINOPHEN 5-325 MG PO TABS: 1.0000 | ORAL_TABLET | Freq: Once | ORAL | Status: DC

## 2013-03-14 MED ORDER — KETOROLAC TROMETHAMINE 60 MG/2ML IM SOLN
60.0000 mg | Freq: Once | INTRAMUSCULAR | Status: AC
  Administered 2013-03-14: 60 mg via INTRAMUSCULAR
  Filled 2013-03-14: qty 2

## 2013-03-14 NOTE — MAU Note (Signed)
Pt presents with complaints of lower abdominal pain under her belly button for approximately 2 days. States that her back started hurting today and she noticed some rectal bleeding today also.

## 2013-03-14 NOTE — MAU Provider Note (Signed)
History     CSN: [id number]  Arrival date and time: 03/14/13 0947   None     Chief Complaint  Patient presents with  . Abdominal Pain  . Back Pain  . Rectal Bleeding   HPI 32 y.o. [id number] with abd pain x about 1.5 weeks. Pain in mid abdomen. At first, only after eating, now constant. No n/v. C/O some constipation, having regular bowel movements, but smaller than usual and having to strain. Has burning in abdomen and feels urge to have BM, but then can't go. Had some bleeding with BM yesterday after straining. Has h/o hemorrhoid. Pt was advised to f/u with PCP or GI doctor by her OB/GYN, but she states she can't go back there d/t co-pay. Pt has had a hysterectomy.   Past Medical History  Diagnosis Date  . Endometriosis   . Type 2 diabetes mellitus   . Anemia   . BV (bacterial vaginosis)   . Hepatic steatosis   . Arthritis   . Asthma   . IBS (irritable bowel syndrome)   . Obesity   . Chronic pelvic pain in female   . Hashimoto thyroiditis   . OSA (obstructive sleep apnea)   . Anxiety disorder   . SVT (supraventricular tachycardia)   . Diabetes mellitus   . IC (interstitial cystitis)   . Hypertension     Past Surgical History  Procedure Laterality Date  . Total abdominal hysterectomy  2004  . Tubal ligation  2001  . Shoulder surgery  2008    right  . Wrist surgery  2008    right  . Cystostomy w/ bladder biopsy    . Bladder staple      Family History  Problem Relation Age of Onset  . Alcohol abuse    . Arthritis Maternal Grandmother   . Diabetes Daughter     type 2  . Hypertension    . Hyperlipidemia    . Kidney disease    . Colon cancer      uncle  . Prostate cancer      uncles and grandfather  . Cirrhosis      both grandmother and grandfather  . Lung cancer    . Heart disease    . Thyroid disease    . Colon polyps    . Liver disease      History  Substance Use Topics  . Smoking status: Never Smoker   . Smokeless tobacco: Never Used  .  Alcohol Use: No    Allergies:  Allergies  Allergen Reactions  . Amoxicillin Swelling and Rash  . Penicillins Swelling and Rash    Prescriptions prior to admission  Medication Sig Dispense Refill  . metFORMIN (GLUCOPHAGE) 500 MG tablet Take 500 mg by mouth 2 (two) times daily with a meal.        Review of Systems  Constitutional: Negative.  Negative for fever and chills.  Respiratory: Negative.   Cardiovascular: Negative.   Gastrointestinal: Positive for abdominal pain, constipation and blood in stool. Negative for nausea, vomiting and diarrhea.  Genitourinary: Negative for dysuria, urgency, frequency, hematuria and flank pain.       Negative for vaginal bleeding, vaginal discharge  Musculoskeletal: Negative.   Neurological: Negative.   Psychiatric/Behavioral: Negative.    Physical Exam   Blood pressure 144/77, pulse 93, temperature 98.5 F (36.9 C), resp. rate 18, height 5\' 1"  (1.549 m), weight 181 lb (82.101 kg).  Physical Exam  Nursing note and vitals reviewed. Constitutional: She  is oriented to person, place, and time. She appears well-developed and well-nourished. No distress (appears uncomfortable).  Cardiovascular: Normal rate.   Respiratory: Effort normal.  GI: Soft. She exhibits no distension and no mass. There is tenderness (epigastric). There is no rebound and no guarding.  Musculoskeletal: Normal range of motion.  Neurological: She is alert and oriented to person, place, and time.  Skin: Skin is warm and dry.  Psychiatric: She has a normal mood and affect.    MAU Course  Procedures  Results for orders placed during the hospital encounter of 03/14/13 (from the past 24 hour(s))  URINALYSIS, ROUTINE W REFLEX MICROSCOPIC     Status: None   Collection Time    03/14/13  9:50 AM      Result Value Range   Color, Urine YELLOW  YELLOW   APPearance CLEAR  CLEAR   Specific Gravity, Urine 1.025  1.005 - 1.030   pH 5.5  5.0 - 8.0   Glucose, UA NEGATIVE  NEGATIVE  mg/dL   Hgb urine dipstick NEGATIVE  NEGATIVE   Bilirubin Urine NEGATIVE  NEGATIVE   Ketones, ur NEGATIVE  NEGATIVE mg/dL   Protein, ur NEGATIVE  NEGATIVE mg/dL   Urobilinogen, UA 0.2  0.0 - 1.0 mg/dL   Nitrite NEGATIVE  NEGATIVE   Leukocytes, UA NEGATIVE  NEGATIVE  AMYLASE     Status: None   Collection Time    03/14/13  9:55 AM      Result Value Range   Amylase 28  0 - 105 U/L  COMPREHENSIVE METABOLIC PANEL     Status: Abnormal   Collection Time    03/14/13  9:55 AM      Result Value Range   Sodium 138  135 - 145 mEq/L   Potassium 4.1  3.5 - 5.1 mEq/L   Chloride 102  96 - 112 mEq/L   CO2 24  19 - 32 mEq/L   Glucose, Bld 144 (*) 70 - 99 mg/dL   BUN 11  6 - 23 mg/dL   Creatinine, Ser 1.61  0.50 - 1.10 mg/dL   Calcium 09.6  8.4 - 04.5 mg/dL   Total Protein 7.5  6.0 - 8.3 g/dL   Albumin 4.0  3.5 - 5.2 g/dL   AST 55 (*) 0 - 37 U/L   ALT 58 (*) 0 - 35 U/L   Alkaline Phosphatase 50  39 - 117 U/L   Total Bilirubin 0.3  0.3 - 1.2 mg/dL   GFR calc non Af Amer >90  >90 mL/min   GFR calc Af Amer >90  >90 mL/min  LIPASE, BLOOD     Status: None   Collection Time    03/14/13  9:55 AM      Result Value Range   Lipase 14  11 - 59 U/L  CBC     Status: None   Collection Time    03/14/13 10:10 AM      Result Value Range   WBC 5.2  4.0 - 10.5 K/uL   RBC 4.43  3.87 - 5.11 MIL/uL   Hemoglobin 13.1  12.0 - 15.0 g/dL   HCT 40.9  81.1 - 91.4 %   MCV 87.1  78.0 - 100.0 fL   MCH 29.6  26.0 - 34.0 pg   MCHC 33.9  30.0 - 36.0 g/dL   RDW 78.2  95.6 - 21.3 %   Platelets 208  150 - 400 K/uL   US Abdomen Complete  03/14/2013   *RADIOLOGY REPORT*  Clinical Data:  Abdominal pain  ABDOMINAL ULTRASOUND COMPLETE  Comparison:  06/19/2011  Findings:  Gallbladder:  No gallstones, gallbladder wall thickening, or pericholecystic fluid.  Common Bile Duct:  Within normal limits in caliber.  Liver: No focal mass lesion identified. Increased parenchymal echogenicity compatible with steatosis.  IVC:  Appears  normal.  Pancreas:  No abnormality identified.  Spleen:  Within normal limits in size and echotexture.  Right kidney:  Normal in size and parenchymal echogenicity.  No evidence of mass or hydronephrosis.  Left kidney:  Normal in size and parenchymal echogenicity.  No evidence of mass or hydronephrosis.  Abdominal Aorta:  No aneurysm identified.  IMPRESSION:  1.  Hepatic steatosis. 2.  No acute findings.   Original Report Authenticated By: Signa Kell, M.D.   Pt has known hepatic steatosis - u/s findings and elevated LFTs are not new finding.   No relief with Toradol 60 mg IM. No relief with GI cocktail.  Assessment and Plan   1. Abdominal pain, epigastric   2. Unspecified constipation   Pt has epigastric pain/upper abdominal pain that is not relieved with Toradol or GI cocktail. Has some ongoing constipation. Labs, VS and u/s today are normal. Discussed that we cannot tell her a definite cause for her pain today - could be s/t constipation or other GI etiology. There does not appear to be an emergency condition present at this time. Pt states she has pain medicine and miralax at home. I recommended trying this. If she has normal BM and still has pain or if pain is worsening/new symptoms develop, should follow up with PCP. Pt states understanding.     Medication List    TAKE these medications       acetaminophen 325 MG tablet  Commonly known as:  TYLENOL  Take 650 mg by mouth every 6 (six) hours as needed for pain.     metFORMIN 500 MG tablet  Commonly known as:  GLUCOPHAGE  Take 500 mg by mouth 2 (two) times daily with a meal.     UNKNOWN TO PATIENT  Inject 1 each as directed every 30 (thirty) days.            Follow-up Information   Follow up with Eartha Inch, MD. (As needed)    Contact information:   [address] [place] [address] [phone]         Kota Ciancio 03/14/2013, 10:15 AM

## 2013-07-30 ENCOUNTER — Other Ambulatory Visit: Payer: Self-pay

## 2013-12-14 ENCOUNTER — Other Ambulatory Visit: Payer: Self-pay | Admitting: Obstetrics and Gynecology

## 2013-12-14 DIAGNOSIS — N644 Mastodynia: Secondary | ICD-10-CM

## 2013-12-22 ENCOUNTER — Other Ambulatory Visit: Payer: BC Managed Care – PPO

## 2014-01-04 ENCOUNTER — Other Ambulatory Visit: Payer: BC Managed Care – PPO

## 2014-03-23 ENCOUNTER — Other Ambulatory Visit: Payer: Self-pay | Admitting: Endocrinology

## 2014-03-23 DIAGNOSIS — E049 Nontoxic goiter, unspecified: Secondary | ICD-10-CM

## 2014-03-24 ENCOUNTER — Ambulatory Visit
Admission: RE | Admit: 2014-03-24 | Discharge: 2014-03-24 | Disposition: A | Discharge status: Home / Self Care | Payer: Medicare Other | Source: Ambulatory Visit | Attending: Endocrinology | Admitting: Endocrinology

## 2014-03-24 DIAGNOSIS — E049 Nontoxic goiter, unspecified: Secondary | ICD-10-CM

## 2014-04-07 ENCOUNTER — Ambulatory Visit: Payer: Medicare Other | Admitting: Dietician

## 2014-05-19 ENCOUNTER — Ambulatory Visit: Payer: Medicare Other | Admitting: Dietician

## 2014-06-14 ENCOUNTER — Ambulatory Visit (INDEPENDENT_AMBULATORY_CARE_PROVIDER_SITE_OTHER): Payer: Medicare Other | Admitting: Internal Medicine

## 2014-06-14 ENCOUNTER — Encounter: Payer: Self-pay | Admitting: Internal Medicine

## 2014-06-14 VITALS — BP 130/78 | HR 80 | Ht 60.0 in | Wt 181.0 lb

## 2014-06-14 DIAGNOSIS — R002 Palpitations: Secondary | ICD-10-CM

## 2014-06-14 MED ORDER — PANTOPRAZOLE SODIUM 40 MG PO TBEC: 40.0000 mg | DELAYED_RELEASE_TABLET | Freq: Every day | ORAL | Status: DC

## 2014-06-14 NOTE — Patient Instructions (Signed)
Your physician has recommended you make the following change in your medication:  1.) begin protonix 40 mg once daily  Your physician recommends that you schedule a follow-up appointment in: 4-6 weeks with Dr. Harrington Challenger.  Please increase your fluid intake and activity.

## 2014-06-14 NOTE — Progress Notes (Signed)
HPI Patient is a 33 yo who is referred for continued cardiac care She has a history of sinus tachycardia  Followed by Dr Mauricio Po and [place] [facility] [place] [facility].  Has had event monitors and holters.  Had echo this summer that was normal.   The patinet has been on b blocker in past  Held when switched primary care. She has had some low  BP readings in the past.  No syncope  Does not drink fluids throughout the day. She has some chest pain  Worse with cold drinks  Does note bitter taste in mouth  Some wheezing Just seen by Dr Melissa Noon office.  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  Allergies  Allergen Reactions  . Codeine Nausea Only  . Amoxicillin Swelling and Rash  . Penicillins Swelling and Rash    Current Outpatient Prescriptions  Medication Sig Dispense Refill  . acetaminophen (TYLENOL) 325 MG tablet Take 650 mg by mouth every 6 (six) hours as needed for pain.      Marland Kitchen albuterol (PROAIR HFA) 108 (90 BASE) MCG/ACT inhaler Inhale into the lungs every 6 (six) hours as needed for wheezing or shortness of breath.      Marland Kitchen aspirin EC 81 MG tablet Take 81 mg by mouth daily.      . celecoxib (CELEBREX) 200 MG capsule Take 200 mg by mouth daily. As needed for pain      . DIAZEPAM RE by Other route. Place 10 mg vaginally at bedtime      . ergocalciferol (VITAMIN D2) 50000 UNITS capsule Take 50,000 Units by mouth once a week.      . estradiol (VIVELLE-DOT) 0.1 MG/24HR patch Place 1 patch onto the skin 2 (two) times a week.      Marland Kitchen glimepiride (AMARYL) 2 MG tablet Take 2 mg by mouth 2 (two) times daily.      . Linaclotide (LINZESS) 145 MCG CAPS capsule Take 145 mcg by mouth daily.      Marland Kitchen lubiprostone (AMITIZA) 24 MCG capsule Take 24 mcg by mouth 2 (two) times daily with a meal.      . metFORMIN (GLUCOPHAGE) 500 MG tablet  Take 500 mg by mouth 2 (two) times daily with a meal.       No current facility-administered medications for this visit.    Past Medical History  Diagnosis Date  . Endometriosis   . Type 2 diabetes mellitus   . Anemia   . BV (bacterial vaginosis)   . Hepatic steatosis   . Arthritis   . Asthma   . IBS (irritable bowel syndrome)   . Obesity   . Chronic pelvic pain in female   . Hashimoto thyroiditis   . OSA (obstructive sleep apnea)   . Anxiety disorder   . SVT (supraventricular tachycardia)   . Diabetes mellitus   . IC (interstitial cystitis)   . Hypertension     Past Surgical History  Procedure Laterality Date  . Total abdominal hysterectomy  2004  . Tubal ligation  2001  . Shoulder surgery  2008    right  . Wrist surgery  2008    right  . Cystostomy w/ bladder biopsy    . Bladder staple  Family History  Problem Relation Age of Onset  . Alcohol abuse    . Arthritis Maternal Grandmother   . Diabetes Daughter     type 2  . Hypertension    . Hyperlipidemia    . Kidney disease    . Colon cancer      uncle  . Prostate cancer      uncles and grandfather  . Cirrhosis      both grandmother and grandfather  . Lung cancer    . Heart disease    . Thyroid disease    . Colon polyps    . Liver disease    . Hypertension Father     History   Social History  . Marital Status: Divorced    Spouse Name: N/A    Number of Children: N/A  . Years of Education: N/A   Occupational History  . Teacher [employer]   Social History Main Topics  . Smoking status: Never Smoker   . Smokeless tobacco: Never Used  . Alcohol Use: No  . Drug Use: No  . Sexual Activity: Not Currently   Other Topics Concern  . Not on file   Social History Narrative  . No narrative on file    Review of Systems:  All systems reviewed.  They are negative to the above problem except as previously stated.  Vital Signs: Ht 5' (1.524 m)  Wt 181 lb (82.101 kg)  BMI 35.35  kg/m2 BP130/78  P  80   Physical Exam Patinet is in NAD  HEENT:  Normocephalic, atraumatic. EOMI, PERRLA.  Neck: JVP is normal.  No bruits.  Lungs: clear to auscultation. No rales no wheezes.  Heart: Regular rate and rhythm. Normal S1, S2. No S3.   No significant murmurs. PMI not displaced.  Abdomen:  Supple, nontender. Normal bowel sounds. No masses. No hepatomegaly.  Extremities:   Good distal pulses throughout. No lower extremity edema.  Musculoskeletal :moving all extremities.  Neuro:   alert and oriented x3.  CN II-XII grossly intact.  EKG  SR 80 Assessment and Plan:  1.  CP  Does not appear to be cardiac  I do not think she needs a stress test.  Hx suggests reflux  WOuld place on protonix.  Avoid cold foods  2.  Palpitations.  Hx Sinus tach  She admits to not drinking a lot during day  Stressed adeq fluid intake.  3.  Leg pain  Noncardiac  Not vascular  WIll f/u in 4 to 6 wks  ENcouraged her to increase activity.

## 2014-07-01 ENCOUNTER — Emergency Department (HOSPITAL_BASED_OUTPATIENT_CLINIC_OR_DEPARTMENT_OTHER): Payer: Medicare Other

## 2014-07-01 ENCOUNTER — Emergency Department (HOSPITAL_BASED_OUTPATIENT_CLINIC_OR_DEPARTMENT_OTHER)
Admission: EM | Admit: 2014-07-01 | Discharge: 2014-07-01 | Disposition: A | Discharge status: Home / Self Care | Payer: Medicare Other | Attending: Emergency Medicine | Admitting: Emergency Medicine

## 2014-07-01 ENCOUNTER — Encounter (HOSPITAL_BASED_OUTPATIENT_CLINIC_OR_DEPARTMENT_OTHER): Payer: Self-pay | Admitting: Emergency Medicine

## 2014-07-01 DIAGNOSIS — I10 Essential (primary) hypertension: Secondary | ICD-10-CM | POA: Insufficient documentation

## 2014-07-01 DIAGNOSIS — F419 Anxiety disorder, unspecified: Secondary | ICD-10-CM | POA: Insufficient documentation

## 2014-07-01 DIAGNOSIS — K589 Irritable bowel syndrome without diarrhea: Secondary | ICD-10-CM | POA: Diagnosis not present

## 2014-07-01 DIAGNOSIS — Z8669 Personal history of other diseases of the nervous system and sense organs: Secondary | ICD-10-CM | POA: Diagnosis not present

## 2014-07-01 DIAGNOSIS — Z7982 Long term (current) use of aspirin: Secondary | ICD-10-CM | POA: Diagnosis not present

## 2014-07-01 DIAGNOSIS — M79604 Pain in right leg: Secondary | ICD-10-CM | POA: Diagnosis not present

## 2014-07-01 DIAGNOSIS — E669 Obesity, unspecified: Secondary | ICD-10-CM | POA: Insufficient documentation

## 2014-07-01 DIAGNOSIS — Z862 Personal history of diseases of the blood and blood-forming organs and certain disorders involving the immune mechanism: Secondary | ICD-10-CM | POA: Diagnosis not present

## 2014-07-01 DIAGNOSIS — Z8742 Personal history of other diseases of the female genital tract: Secondary | ICD-10-CM | POA: Diagnosis not present

## 2014-07-01 DIAGNOSIS — M1990 Unspecified osteoarthritis, unspecified site: Secondary | ICD-10-CM | POA: Insufficient documentation

## 2014-07-01 DIAGNOSIS — Z880 Allergy status to penicillin: Secondary | ICD-10-CM | POA: Diagnosis not present

## 2014-07-01 DIAGNOSIS — Z8619 Personal history of other infectious and parasitic diseases: Secondary | ICD-10-CM | POA: Diagnosis not present

## 2014-07-01 DIAGNOSIS — Z79899 Other long term (current) drug therapy: Secondary | ICD-10-CM | POA: Insufficient documentation

## 2014-07-01 DIAGNOSIS — J45909 Unspecified asthma, uncomplicated: Secondary | ICD-10-CM | POA: Diagnosis not present

## 2014-07-01 DIAGNOSIS — G8929 Other chronic pain: Secondary | ICD-10-CM | POA: Diagnosis not present

## 2014-07-01 DIAGNOSIS — E119 Type 2 diabetes mellitus without complications: Secondary | ICD-10-CM | POA: Diagnosis not present

## 2014-07-01 LAB — BASIC METABOLIC PANEL
Anion gap: 17 — ABNORMAL HIGH (ref 5–15)
BUN: 13 mg/dL (ref 6–23)
CALCIUM: 10.5 mg/dL (ref 8.4–10.5)
CO2: 24 meq/L (ref 19–32)
CREATININE: 0.8 mg/dL (ref 0.50–1.10)
Chloride: 100 mEq/L (ref 96–112)
GFR calc Af Amer: >90 mL/min (ref >90)
GFR calc non Af Amer: >90 mL/min (ref >90)
GLUCOSE: 167 mg/dL — AB (ref 70–99)
Potassium: 4.2 mEq/L (ref 3.7–5.3)
SODIUM: 141 meq/L (ref 137–147)

## 2014-07-01 MED ORDER — TRAMADOL HCL 50 MG PO TABS
50.0000 mg | ORAL_TABLET | Freq: Once | ORAL | Status: AC
  Administered 2014-07-01: 50 mg via ORAL
  Filled 2014-07-01: qty 1

## 2014-07-01 NOTE — ED Provider Notes (Signed)
CSN: [id number]     Arrival date & time 07/01/14  1643 History   First MD Initiated Contact with Patient 07/01/14 1739     Chief Complaint  Patient presents with  . Leg Pain     (Consider location/radiation/quality/duration/timing/severity/associated sxs/prior Treatment) HPI Comments: Patient is a 33 year old female past medical history significant for DM, hypertension presenting to the emergency department from her primary care doctor's office for evaluation of days also right lower sternum and knee pain. Patient states at nighttime she has pain to the right lower extremity and states that her leg feels "ice cold." She states she has not tried taking any of her home pain medication for her symptoms. Patient states that her primary care physician told her to come to emergency department for a DVT rule out study. Patient does take OCPs. No history of PEs or DVTs.  Patient is a 33 y.o. female presenting with leg pain.  Leg Pain   Past Medical History  Diagnosis Date  . Endometriosis   . Type 2 diabetes mellitus   . Anemia   . BV (bacterial vaginosis)   . Hepatic steatosis   . Arthritis   . Asthma   . IBS (irritable bowel syndrome)   . Obesity   . Chronic pelvic pain in female   . Hashimoto thyroiditis   . OSA (obstructive sleep apnea)   . Anxiety disorder   . SVT (supraventricular tachycardia)   . Diabetes mellitus   . IC (interstitial cystitis)   . Hypertension    Past Surgical History  Procedure Laterality Date  . Total abdominal hysterectomy  2004  . Tubal ligation  2001  . Shoulder surgery  2008    right  . Wrist surgery  2008    right  . Cystostomy w/ bladder biopsy    . Bladder staple     Family History  Problem Relation Age of Onset  . Alcohol abuse    . Arthritis Maternal Grandmother   . Diabetes Daughter     type 2  . Hypertension    . Hyperlipidemia    . Kidney disease    . Colon cancer      uncle  . Prostate cancer      uncles and grandfather  .  Cirrhosis      both grandmother and grandfather  . Lung cancer    . Heart disease    . Thyroid disease    . Colon polyps    . Liver disease    . Hypertension Father    History  Substance Use Topics  . Smoking status: Never Smoker   . Smokeless tobacco: Never Used  . Alcohol Use: No   OB History   Grav Para Term Preterm Abortions TAB SAB Ect Mult Living   4 3 3  0 1 0 1 0 0 3     Review of Systems  Musculoskeletal: Positive for myalgias.  All other systems reviewed and are negative.     Allergies  Codeine; Amoxicillin; and Penicillins  Home Medications   Prior to Admission medications   Medication Sig Start Date End Date Taking? Authorizing Provider  acetaminophen (TYLENOL) 325 MG tablet Take 650 mg by mouth every 6 (six) hours as needed for pain.    Historical Provider, MD  albuterol (PROAIR HFA) 108 (90 BASE) MCG/ACT inhaler Inhale into the lungs every 6 (six) hours as needed for wheezing or shortness of breath.    Historical Provider, MD  aspirin EC 81 MG tablet  Take 81 mg by mouth daily.    Historical Provider, MD  DIAZEPAM RE by Other route. Place 10 mg vaginally at bedtime    Historical Provider, MD  ergocalciferol (VITAMIN D2) 50000 UNITS capsule Take 50,000 Units by mouth once a week.    Historical Provider, MD  estradiol (VIVELLE-DOT) 0.1 MG/24HR patch Place 1 patch onto the skin 2 (two) times a week.    Historical Provider, MD  glimepiride (AMARYL) 2 MG tablet Take 2 mg by mouth 2 (two) times daily.    Historical Provider, MD  Linaclotide Rolan Lipa) 145 MCG CAPS capsule Take 145 mcg by mouth daily.    Historical Provider, MD  lubiprostone (AMITIZA) 24 MCG capsule Take 24 mcg by mouth 2 (two) times daily with a meal.    Historical Provider, MD  metFORMIN (GLUCOPHAGE) 500 MG tablet Take 500 mg by mouth 2 (two) times daily with a meal.    Historical Provider, MD  pantoprazole (PROTONIX) 40 MG tablet Take 1 tablet (40 mg total) by mouth daily. 06/14/14   Fay Records, MD    BP 140/65  Pulse 88  Temp(Src) 98.6 F (37 C) (Oral)  Resp 18  SpO2 100% Physical Exam  Nursing note and vitals reviewed. Constitutional: She is oriented to person, place, and time. She appears well-developed and well-nourished. No distress.  HENT:  Head: Normocephalic and atraumatic.  Right Ear: External ear normal.  Left Ear: External ear normal.  Nose: Nose normal.  Mouth/Throat: Oropharynx is clear and moist. No oropharyngeal exudate.  Eyes: Conjunctivae and EOM are normal. Pupils are equal, round, and reactive to light.  Neck: Normal range of motion. Neck supple.  Cardiovascular: Normal rate, regular rhythm, normal heart sounds and intact distal pulses.   Pulmonary/Chest: Effort normal and breath sounds normal. No respiratory distress.  Abdominal: Soft. There is no tenderness.  Musculoskeletal: Normal range of motion. She exhibits tenderness.       Right hip: Normal.       Right knee: Normal.       Right ankle: Normal.       Right upper leg: Normal.       Right lower leg: She exhibits tenderness. She exhibits no bony tenderness, no swelling, no edema, no deformity and no laceration.       Right foot: Normal.       Feet:  Neurological: She is alert and oriented to person, place, and time. She has normal strength. No cranial nerve deficit. Gait normal. GCS eye subscore is 4. GCS verbal subscore is 5. GCS motor subscore is 6.  Sensation grossly intact.  No pronator drift.  Bilateral heel-knee-shin intact.  Skin: Skin is warm and dry. She is not diaphoretic.    ED Course  Procedures (including critical care time) Medications  traMADol (ULTRAM) tablet 50 mg (50 mg Oral Given 07/01/14 2044)    Labs Review Labs Reviewed  BASIC METABOLIC PANEL - Abnormal; Notable for the following:    Glucose, Bld 167 (*)    Anion gap 17 (*)    All other components within normal limits    Imaging Review US Venous Img Lower Unilateral Right  07/01/2014   CLINICAL DATA:  Right lower  extremity pain. The right foot is cold. Initial encounter  EXAM: Right LOWER EXTREMITY VENOUS DOPPLER ULTRASOUND  TECHNIQUE: Gray-scale sonography with graded compression, as well as color Doppler and duplex ultrasound were performed to evaluate the lower extremity deep venous systems from the level of the common femoral vein  and including the common femoral, femoral, profunda femoral, popliteal and calf veins including the posterior tibial, peroneal and gastrocnemius veins when visible. The superficial great saphenous vein was also interrogated. Spectral Doppler was utilized to evaluate flow at rest and with distal augmentation maneuvers in the common femoral, femoral and popliteal veins.  COMPARISON:  None.  FINDINGS: Common Femoral Vein: No evidence of thrombus. Normal compressibility, respiratory phasicity and response to augmentation.  Saphenofemoral Junction: No evidence of thrombus. Normal compressibility and flow on color Doppler imaging.  Profunda Femoral Vein: No evidence of thrombus. Normal compressibility and flow on color Doppler imaging.  Femoral Vein: No evidence of thrombus. Normal compressibility, respiratory phasicity and response to augmentation.  Popliteal Vein: No evidence of thrombus. Normal compressibility, respiratory phasicity and response to augmentation.  Calf Veins: No evidence of thrombus. Normal compressibility and flow on color Doppler imaging.  Superficial Great Saphenous Vein: No evidence of thrombus. Normal compressibility and flow on color Doppler imaging.  Venous Reflux:  None.  Other Findings:  None.  IMPRESSION: No evidence of deep venous thrombosis. Of note, the study does not evaluate arterial flow.   Electronically Signed   By: Lawrence Santiago M.D.   On: 07/01/2014 20:13     EKG Interpretation None      MDM   Final diagnoses:  Right leg pain    Filed Vitals:   07/01/14 2044  BP: 140/65  Pulse: 88  Temp:   Resp: 18   Afebrile, NAD, non-toxic appearing,  AAOx4.  Neurovascularly intact. Normal sensation. No evidence of compartment syndrome. No erythema, swelling, rash, or warmth to suggest infection. I have reviewed nursing notes, vital signs, and all appropriate lab and imaging results for this patient. Lower extremity ultrasound performed without evidence of DVT. Advised patient to use at home pain medication and f/u with PCP. Return precautions discussed. Patient is agreeable to plan. Patient is stable at time of discharge      Harlow Mares, PA-C 07/01/14 2141

## 2014-07-01 NOTE — ED Notes (Signed)
States  Rt leg gets cold and painful at night  Onset x 5 days

## 2014-07-01 NOTE — Discharge Instructions (Signed)
Please follow up with your primary care physician in 1-2 days. If you do not have one please call the [place] number listed above. Please use your at home pain medication for your pain. .jdpc  Muscle Pain Muscle pain (myalgia) may be caused by many things, including:  Overuse or muscle strain, especially if you are not in shape. This is the most common cause of muscle pain.  Injury.  Bruises.  Viruses, such as the flu.  Infectious diseases.  Fibromyalgia, which is a chronic condition that causes muscle tenderness, fatigue, and headache.  Autoimmune diseases, including lupus.  Certain drugs, including ACE inhibitors and statins. Muscle pain may be mild or severe. In most cases, the pain lasts only a short time and goes away without treatment. To diagnose the cause of your muscle pain, your health care provider will take your medical history. This means he or she will ask you when your muscle pain began and what has been happening. If you have not had muscle pain for very long, your health care provider may want to wait before doing much testing. If your muscle pain has lasted a long time, your health care provider may want to run tests right away. If your health care provider thinks your muscle pain may be caused by illness, you may need to have additional tests to rule out certain conditions.  Treatment for muscle pain depends on the cause. Home care is often enough to relieve muscle pain. Your health care provider may also prescribe anti-inflammatory medicine. HOME CARE INSTRUCTIONS Watch your condition for any changes. The following actions may help to lessen any discomfort you are feeling:  Only take over-the-counter or prescription medicines as directed by your health care provider.  Apply ice to the sore muscle:  Put ice in a plastic bag.  Place a towel between your skin and the bag.  Leave the ice on for 15-20 minutes, 3-4 times a day.  You may  alternate applying hot and cold packs to the muscle as directed by your health care provider.  If overuse is causing your muscle pain, slow down your activities until the pain goes away.  Remember that it is normal to feel some muscle pain after starting a workout program. Muscles that have not been used often will be sore at first.  Do regular, gentle exercises if you are not usually active.  Warm up before exercising to lower your risk of muscle pain.  Do not continue working out if the pain is very bad. Bad pain could mean you have injured a muscle. SEEK MEDICAL CARE IF:  Your muscle pain gets worse, and medicines do not help.  You have muscle pain that lasts longer than 3 days.  You have a rash or fever along with muscle pain.  You have muscle pain after a tick bite.  You have muscle pain while working out, even though you are in good physical condition.  You have redness, soreness, or swelling along with muscle pain.  You have muscle pain after starting a new medicine or changing the dose of a medicine. SEEK IMMEDIATE MEDICAL CARE IF:  You have trouble breathing.  You have trouble swallowing.  You have muscle pain along with a stiff neck, fever, and vomiting.  You have severe muscle weakness or cannot move part of your body. MAKE SURE YOU:   Understand these instructions.  Will watch your condition.  Will get help right away if you are not doing  well or get worse. Document Released: 08/02/2006 Document Revised: 09/15/2013 Document Reviewed: 07/07/2013 [facility] Patient Information 2015 [place], [place]. This information is not intended to replace advice given to you by your health care provider. Make sure you discuss any questions you have with your health care provider.  Edema Edema is an abnormal buildup of fluids in your bodytissues. Edema is somewhatdependent on gravity to pull the fluid to the lowest place in your body. That makes the condition more common in  the legs and thighs (lower extremities). Painless swelling of the feet and ankles is common and becomes more likely as you get older. It is also common in looser tissues, like around your eyes.  When the affected area is squeezed, the fluid may move out of that spot and leave a dent for a few moments. This dent is called pitting.  CAUSES  There are many possible causes of edema. Eating too much salt and being on your feet or sitting for a long time can cause edema in your legs and ankles. Hot weather may make edema worse. Common medical causes of edema include:  Heart failure.  Liver disease.  Kidney disease.  Weak blood vessels in your legs.  Cancer.  An injury.  Pregnancy.  Some medications.  Obesity. SYMPTOMS  Edema is usually painless.Your skin may look swollen or shiny.  DIAGNOSIS  Your health care provider may be able to diagnose edema by asking about your medical history and doing a physical exam. You may need to have tests such as X-rays, an electrocardiogram, or blood tests to check for medical conditions that may cause edema.  TREATMENT  Edema treatment depends on the cause. If you have heart, liver, or kidney disease, you need the treatment appropriate for these conditions. General treatment may include:  Elevation of the affected body part above the level of your heart.  Compression of the affected body part. Pressure from elastic bandages or support stockings squeezes the tissues and forces fluid back into the blood vessels. This keeps fluid from entering the tissues.  Restriction of fluid and salt intake.  Use of a water pill (diuretic). These medications are appropriate only for some types of edema. They pull fluid out of your body and make you urinate more often. This gets rid of fluid and reduces swelling, but diuretics can have side effects. Only use diuretics as directed by your health care provider. HOME CARE INSTRUCTIONS   Keep the affected body part above  the level of your heart when you are lying down.   Do not sit still or stand for prolonged periods.   Do not put anything directly under your knees when lying down.  Do not wear constricting clothing or garters on your upper legs.   Exercise your legs to work the fluid back into your blood vessels. This may help the swelling go down.   Wear elastic bandages or support stockings to reduce ankle swelling as directed by your health care provider.   Eat a low-salt diet to reduce fluid if your health care provider recommends it.   Only take medicines as directed by your health care provider. SEEK MEDICAL CARE IF:   Your edema is not responding to treatment.  You have heart, liver, or kidney disease and notice symptoms of edema.  You have edema in your legs that does not improve after elevating them.   You have sudden and unexplained weight gain. SEEK IMMEDIATE MEDICAL CARE IF:   You develop shortness of breath  or chest pain.   You cannot breathe when you lie down.  You develop pain, redness, or warmth in the swollen areas.   You have heart, liver, or kidney disease and suddenly get edema.  You have a fever and your symptoms suddenly get worse. MAKE SURE YOU:   Understand these instructions.  Will watch your condition.  Will get help right away if you are not doing well or get worse. Document Released: 09/10/2005 Document Revised: 01/25/2014 Document Reviewed: 07/03/2013 [facility] Patient Information 2015 [place], [place]. This information is not intended to replace advice given to you by your health care provider. Make sure you discuss any questions you have with your health care provider.

## 2014-07-01 NOTE — ED Notes (Signed)
Pt in [place]

## 2014-07-02 NOTE — ED Provider Notes (Signed)
Medical screening examination/treatment/procedure(s) were performed by non-physician practitioner and as supervising physician I was immediately available for consultation/collaboration.  Orpah Greek, MD 07/02/14 [phone]

## 2014-07-26 ENCOUNTER — Encounter (HOSPITAL_BASED_OUTPATIENT_CLINIC_OR_DEPARTMENT_OTHER): Payer: Self-pay | Admitting: Emergency Medicine

## 2014-08-01 NOTE — Progress Notes (Signed)
HPI Patient is a 33 yo who is referred for continued cardiac care She has a history of sinus tachycardia  Followed by Dr Mauricio Po and PA [facility] [place].  Has had event monitors and holters.  Had echo this summer that was normal.   The patinet has been on b blocker in past  Held when switched primary care. She has had some low  BP readings in the past.  No syncope  Does not drink fluids throughout the day. She has some chest pain  Worse with cold drinks  Does note bitter taste in mouth  Some wheezing Just seen by Dr Melissa Noon office.  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   I saw the patient this past September  Allergies  Allergen Reactions  . Codeine Nausea Only  . Amoxicillin Swelling and Rash  . Penicillins Swelling and Rash    Current Outpatient Prescriptions  Medication Sig Dispense Refill  . acetaminophen (TYLENOL) 325 MG tablet Take 650 mg by mouth every 6 (six) hours as needed for pain.    Marland Kitchen albuterol (PROAIR HFA) 108 (90 BASE) MCG/ACT inhaler Inhale into the lungs every 6 (six) hours as needed for wheezing or shortness of breath.    Marland Kitchen aspirin EC 81 MG tablet Take 81 mg by mouth daily.    Marland Kitchen DIAZEPAM RE by Other route. Place 10 mg vaginally at bedtime    . ergocalciferol (VITAMIN D2) 50000 UNITS capsule Take 50,000 Units by mouth once a week.    . estradiol (VIVELLE-DOT) 0.1 MG/24HR patch Place 1 patch onto the skin 2 (two) times a week.    Marland Kitchen glimepiride (AMARYL) 2 MG tablet Take 2 mg by mouth 2 (two) times daily.    . Linaclotide (LINZESS) 145 MCG CAPS capsule Take 145 mcg by mouth daily.    Marland Kitchen lubiprostone (AMITIZA) 24 MCG capsule Take 24 mcg by mouth 2 (two) times daily with a meal.    . metFORMIN (GLUCOPHAGE) 500 MG tablet Take 500 mg by mouth 2 (two) times daily with a meal.    .  pantoprazole (PROTONIX) 40 MG tablet Take 1 tablet (40 mg total) by mouth daily. 30 tablet 11   No current facility-administered medications for this visit.    Past Medical History  Diagnosis Date  . Endometriosis   . Type 2 diabetes mellitus   . Anemia   . BV (bacterial vaginosis)   . Hepatic steatosis   . Arthritis   . Asthma   . IBS (irritable bowel syndrome)   . Obesity   . Chronic pelvic pain in female   . Hashimoto thyroiditis   . OSA (obstructive sleep apnea)   . Anxiety disorder   . SVT (supraventricular tachycardia)   . Diabetes mellitus   . IC (interstitial cystitis)   . Hypertension     Past Surgical History  Procedure Laterality Date  . Total abdominal hysterectomy  2004  . Tubal ligation  2001  . Shoulder surgery  2008    right  . Wrist surgery  2008    right  . Cystostomy w/ bladder biopsy    . Bladder staple      Family History  Problem Relation Age of Onset  . Alcohol  abuse    . Arthritis Maternal Grandmother   . Diabetes Daughter     type 2  . Hypertension    . Hyperlipidemia    . Kidney disease    . Colon cancer      uncle  . Prostate cancer      uncles and grandfather  . Cirrhosis      both grandmother and grandfather  . Lung cancer    . Heart disease    . Thyroid disease    . Colon polyps    . Liver disease    . Hypertension Father     History   Social History  . Marital Status: Divorced    Spouse Name: N/A    Number of Children: N/A  . Years of Education: N/A   Occupational History  . Teacher [employer]   Social History Main Topics  . Smoking status: Never Smoker   . Smokeless tobacco: Never Used  . Alcohol Use: No  . Drug Use: No  . Sexual Activity: Not Currently   Other Topics Concern  . Not on file   Social History Narrative    Review of Systems:  All systems reviewed.  They are negative to the above problem except as previously stated.  Vital Signs: There were no vitals taken for this  visit. BP130/78  P  80   Physical Exam Patinet is in NAD  HEENT:  Normocephalic, atraumatic. EOMI, PERRLA.  Neck: JVP is normal.  No bruits.  Lungs: clear to auscultation. No rales no wheezes.  Heart: Regular rate and rhythm. Normal S1, S2. No S3.   No significant murmurs. PMI not displaced.  Abdomen:  Supple, nontender. Normal bowel sounds. No masses. No hepatomegaly.  Extremities:   Good distal pulses throughout. No lower extremity edema.  Musculoskeletal :moving all extremities.  Neuro:   alert and oriented x3.  CN II-XII grossly intact.  EKG  SR 80 Assessment and Plan:  1.  CP  Does not appear to be cardiac  I do not think she needs a stress test.  Hx suggests reflux  WOuld place on protonix.  Avoid cold foods  2.  Palpitations.  Hx Sinus tach  She admits to not drinking a lot during day  Stressed adeq fluid intake.  3.  Leg pain  Noncardiac  Not vascular  WIll f/u in 4 to 6 wks  ENcouraged her to increase activity.    This encounter was created in error - please disregard.

## 2014-08-02 ENCOUNTER — Encounter: Payer: Medicare Other | Admitting: Internal Medicine

## 2015-03-21 ENCOUNTER — Other Ambulatory Visit: Payer: Self-pay

## 2015-07-19 ENCOUNTER — Emergency Department (HOSPITAL_COMMUNITY)
Admission: EM | Admit: 2015-07-19 | Discharge: 2015-07-19 | Disposition: A | Discharge status: Home / Self Care | Payer: Medicare Other | Attending: Emergency Medicine | Admitting: Emergency Medicine

## 2015-07-19 ENCOUNTER — Other Ambulatory Visit: Payer: Self-pay

## 2015-07-19 ENCOUNTER — Emergency Department (HOSPITAL_COMMUNITY): Payer: Medicare Other

## 2015-07-19 ENCOUNTER — Encounter (HOSPITAL_COMMUNITY): Payer: Self-pay | Admitting: Emergency Medicine

## 2015-07-19 DIAGNOSIS — I10 Essential (primary) hypertension: Secondary | ICD-10-CM | POA: Diagnosis not present

## 2015-07-19 DIAGNOSIS — Z8742 Personal history of other diseases of the female genital tract: Secondary | ICD-10-CM | POA: Diagnosis not present

## 2015-07-19 DIAGNOSIS — J45901 Unspecified asthma with (acute) exacerbation: Secondary | ICD-10-CM | POA: Diagnosis not present

## 2015-07-19 DIAGNOSIS — Z862 Personal history of diseases of the blood and blood-forming organs and certain disorders involving the immune mechanism: Secondary | ICD-10-CM | POA: Insufficient documentation

## 2015-07-19 DIAGNOSIS — Z7982 Long term (current) use of aspirin: Secondary | ICD-10-CM | POA: Diagnosis not present

## 2015-07-19 DIAGNOSIS — Z880 Allergy status to penicillin: Secondary | ICD-10-CM | POA: Diagnosis not present

## 2015-07-19 DIAGNOSIS — M542 Cervicalgia: Secondary | ICD-10-CM | POA: Diagnosis not present

## 2015-07-19 DIAGNOSIS — R0789 Other chest pain: Secondary | ICD-10-CM | POA: Diagnosis not present

## 2015-07-19 DIAGNOSIS — E119 Type 2 diabetes mellitus without complications: Secondary | ICD-10-CM | POA: Diagnosis not present

## 2015-07-19 DIAGNOSIS — M1990 Unspecified osteoarthritis, unspecified site: Secondary | ICD-10-CM | POA: Diagnosis not present

## 2015-07-19 DIAGNOSIS — E669 Obesity, unspecified: Secondary | ICD-10-CM | POA: Diagnosis not present

## 2015-07-19 DIAGNOSIS — R002 Palpitations: Secondary | ICD-10-CM | POA: Insufficient documentation

## 2015-07-19 DIAGNOSIS — R079 Chest pain, unspecified: Secondary | ICD-10-CM | POA: Diagnosis present

## 2015-07-19 DIAGNOSIS — G8929 Other chronic pain: Secondary | ICD-10-CM | POA: Diagnosis not present

## 2015-07-19 LAB — CBC
HEMATOCRIT: 39.5 % (ref 36.0–46.0)
HEMOGLOBIN: 13.3 g/dL (ref 12.0–15.0)
MCH: 29.5 pg (ref 26.0–34.0)
MCHC: 33.7 g/dL (ref 30.0–36.0)
MCV: 87.6 fL (ref 78.0–100.0)
PLATELETS: 190 10*3/uL (ref 150–400)
RBC: 4.51 MIL/uL (ref 3.87–5.11)
RDW: 12.7 % (ref 11.5–15.5)
WBC: 5.5 10*3/uL (ref 4.0–10.5)

## 2015-07-19 LAB — BASIC METABOLIC PANEL
ANION GAP: 9 (ref 5–15)
BUN: 8 mg/dL (ref 6–20)
CHLORIDE: 104 mmol/L (ref 101–111)
CO2: 26 mmol/L (ref 22–32)
Calcium: 9.8 mg/dL (ref 8.9–10.3)
Creatinine, Ser: 0.61 mg/dL (ref 0.44–1.00)
GFR calc Af Amer: >60 mL/min (ref >60)
GLUCOSE: 223 mg/dL — AB (ref 65–99)
POTASSIUM: 4.2 mmol/L (ref 3.5–5.1)
Sodium: 139 mmol/L (ref 135–145)

## 2015-07-19 LAB — I-STAT TROPONIN, ED: TROPONIN I, POC: 0 ng/mL (ref 0.00–0.08)

## 2015-07-19 MED ORDER — KETOROLAC TROMETHAMINE 60 MG/2ML IM SOLN
60.0000 mg | Freq: Once | INTRAMUSCULAR | Status: AC
  Administered 2015-07-19: 60 mg via INTRAMUSCULAR
  Filled 2015-07-19: qty 2

## 2015-07-19 NOTE — ED Notes (Signed)
Pt sent from [facility] after having right sided neck pain radiating into her right side of chest and into back. Pt c/o of pain only in right side of neck at this time. Denies any shortness or breath. Shortness of breath has been intermittent. Pt also reports an intermittent fluttering in her chest for 1 week.

## 2015-07-19 NOTE — ED Provider Notes (Signed)
CSN: [id number]     Arrival date & time 07/19/15  1153 History   First MD Initiated Contact with Patient 07/19/15 1155     Chief Complaint  Patient presents with  . Chest Pain   HPI  Lisa Cole is a 34 year old female with PMHx of DM, anxiety, sinus tachycardia, and HTN presenting with chest pain, neck pain and palpitations. Pt states for the last week she has felt her heart "fluttering" intermittently. This happens multiple times a day and lasts for "a minute or so". The fluttering sensation is associated with SOB and fatigue. She is also complaining of intermittent right sided chest pain. The pain is described as squeezing and aching. Movement and eating exacerbates the pain. She states that she occasionally feels like food gets stuck "on the way down" which makes the chest pain worse. She does not currently have complaints of food stuck in her throat. She is not experiencing the chest pain at this time. Chest pain is not exertional or associated with dizziness, lightheadedness, diaphoresis, SOB or nausea. She is also complaining of aching, right sided neck pain. The pain is constant and exacerbated by movement. She states that the pain increases with rotation of her neck. She states "sometimes when I'm stressed out or moving too much then the pain travels to my eyeball and gives me a migraine". Denies current headache. Pt has been seen for similar symptoms in the past and evaluated by a cardiologist in 9/15 who diagnosed her with sinus tachycardia after being on a holter monitor. She also had echo performed at this time which was normal and they did not believe she needed a stress test. Her cardiologist is Dr. Mauricio Po with [facility] cardiology. Her chest pain at last evaluation was thought to be GI in origin.  Past Medical History  Diagnosis Date  . Endometriosis   . Type 2 diabetes mellitus ([place])   . Anemia   . BV (bacterial vaginosis)   . Hepatic steatosis   . Arthritis   . Asthma   .  IBS (irritable bowel syndrome)   . Obesity   . Chronic pelvic pain in female   . Hashimoto thyroiditis   . OSA (obstructive sleep apnea)   . Anxiety disorder   . SVT (supraventricular tachycardia) ([place])   . Diabetes mellitus   . IC (interstitial cystitis)   . Hypertension    Past Surgical History  Procedure Laterality Date  . Total abdominal hysterectomy  2004  . Tubal ligation  2001  . Shoulder surgery  2008    right  . Wrist surgery  2008    right  . Cystostomy w/ bladder biopsy    . Bladder staple     Family History  Problem Relation Age of Onset  . Alcohol abuse    . Arthritis Maternal Grandmother   . Diabetes Daughter     type 2  . Hypertension    . Hyperlipidemia    . Kidney disease    . Colon cancer      uncle  . Prostate cancer      uncles and grandfather  . Cirrhosis      both grandmother and grandfather  . Lung cancer    . Heart disease    . Thyroid disease    . Colon polyps    . Liver disease    . Hypertension Father    Social History  Substance Use Topics  . Smoking status: Never Smoker   . Smokeless  tobacco: Never Used  . Alcohol Use: No   OB History    Gravida Para Term Preterm AB TAB SAB Ectopic Multiple Living   4 3 3  0 1 0 1 0 0 3     Review of Systems  Constitutional: Positive for fatigue. Negative for fever and chills.  HENT: Negative for congestion and rhinorrhea.   Eyes: Negative for visual disturbance.  Respiratory: Positive for shortness of breath. Negative for cough, choking and wheezing.   Cardiovascular: Positive for chest pain and palpitations. Negative for leg swelling.  Gastrointestinal: Negative for nausea, vomiting and abdominal pain.  Musculoskeletal: Positive for neck pain. Negative for back pain and arthralgias.  Skin: Negative for rash.  Neurological: Negative for dizziness, syncope, light-headedness and headaches.  All other systems reviewed and are negative.     Allergies  Liraglutide; Codeine; Amoxicillin;  and Penicillins  Home Medications   Prior to Admission medications   Medication Sig Start Date End Date Taking? Authorizing Provider  acetaminophen (TYLENOL) 325 MG tablet Take 650 mg by mouth every 6 (six) hours as needed for pain.   Yes Historical Provider, MD  aspirin EC 81 MG tablet Take 81 mg by mouth daily.   Yes Historical Provider, MD  ibuprofen (ADVIL,MOTRIN) 400 MG tablet Take 400 mg by mouth every 6 (six) hours as needed for mild pain.   Yes Historical Provider, MD  metFORMIN (GLUCOPHAGE) 500 MG tablet Take 500 mg by mouth every evening.    Yes Historical Provider, MD  pioglitazone (ACTOS) 15 MG tablet Take 15 mg by mouth daily. 06/28/15 06/27/16 Yes Historical Provider, MD  pantoprazole (PROTONIX) 40 MG tablet Take 1 tablet (40 mg total) by mouth daily. Patient not taking: Reported on 07/19/2015 06/14/14   Fay Records, MD   BP 124/82 mmHg  Pulse 84  Temp(Src) 98.6 F (37 C) (Oral)  Resp 18  Ht 5' (1.524 m)  Wt 180 lb (81.647 kg)  BMI 35.15 kg/m2  SpO2 100% Physical Exam  Constitutional: She appears well-developed and well-nourished. No distress.  HENT:  Head: Normocephalic and atraumatic.  Mouth/Throat: Oropharynx is clear and moist.  Eyes: Conjunctivae and EOM are normal. Pupils are equal, round, and reactive to light. Right eye exhibits no discharge. Left eye exhibits no discharge. No scleral icterus.  Neck: Normal range of motion.  TTP over right lateral neck. No tenderness over cervical spinous processes. FROM intact. No spasms appreciated  Cardiovascular: Normal rate, regular rhythm, normal heart sounds and intact distal pulses.   Pulmonary/Chest: Effort normal and breath sounds normal. No respiratory distress. She has no wheezes. She has no rales. She exhibits tenderness.  Right sided chest pain is easily reproducible with palpation  Abdominal: Soft. There is no tenderness. There is no rebound and no guarding.  Musculoskeletal: Normal range of motion.  Moves  extremities spontaneously.   Neurological: She is alert. No cranial nerve deficit. Coordination normal.  5/5 strength of major muscle groups. Sensation to light touch intact. Walks with a steady, coordinated gait.   Skin: Skin is warm and dry.  Psychiatric: She has a normal mood and affect. Her behavior is normal.  Nursing note and vitals reviewed.   ED Course  Procedures (including critical care time) Labs Review Labs Reviewed  BASIC METABOLIC PANEL - Abnormal; Notable for the following:    Glucose, Bld 223 (*)    All other components within normal limits  CBC  I-STAT TROPOININ, ED    Imaging Review Dg Chest 2 View  07/19/2015  CLINICAL DATA:  Heart flutter, sternal chest pain, right neck pain for 2 weeks. EXAM: CHEST  2 VIEW COMPARISON:  Chest x-ray dated 01/31/2013 FINDINGS: Heart size remains normal. Overall cardiomediastinal silhouette is stable in size and configuration. Lungs are clear. Lung volumes are normal. No osseous abnormality. Vague soft tissue heterogeneity is noted overlying the left clavicle, presumably extraneous to the patient. IMPRESSION: 1. No evidence of acute cardiopulmonary abnormality. Heart size is normal. Lungs are clear. 2. Vague soft tissue heterogeneity over the left clavicle, left shoulder region, presumably extraneous to the patient. Recommend physical exam correlation. Electronically Signed   By: Franki Cabot M.D.   On: 07/19/2015 13:22   I have personally reviewed and evaluated these images and lab results as part of my medical decision-making.   EKG Interpretation   Date/Time:  Tuesday July 19 2015 11:58:52 EDT Ventricular Rate:  77 PR Interval:  134 QRS Duration: 80 QT Interval:  363 QTC Calculation: 411 R Axis:   8 Text Interpretation:  Sinus rhythm Borderline T wave abnormalities since  last tracing no significant change Confirmed by Eulis Foster  MD, Vira Agar ([id number])  on 07/19/2015 2:00:24 PM      MDM   Final diagnoses:  Atypical chest  pain  Neck pain  Heart palpitations   Pt presenting with chest pain, neck pain and palpitations x 1 week. Chest pain is non-exertional and exacerbated by movement and eating. Palpitations are non-exertional and associated with SOB and fatigue. Pt has been worked up for similar complaints in past year with holter monitor and echo. This showed normal heart function and episodes of sinus tachycardia. Neck pain is right sided and exacerbated by movement. VSS. Right lateral neck TTP without spasm. Chest pain is easily reproducible with light palpation. Heart RRR. Lungs CTAB. Non-focal neuro exam. ECG unchanged since last tracing. Blood work unremarkable. Troponin 0.00. CXR negative for acute disease. Chest pain is atypical and unlikely to be cardiac or pulmonary. PERC negative. Pt instructed to follow up with PCP in the next few days for these complaints. Pt has been advised to return to the ED if CP becomes exertional, associated with diaphoresis or nausea, radiates to left jaw/arm, worsens or becomes concerning in any way. Instructed to use OTC pain relievers for neck pain. Pt appears reliable for follow up and is agreeable to discharge.      Josephina Gip, PA-C 07/20/15 [place], MD 07/22/15 [phone]

## 2015-07-19 NOTE — Discharge Instructions (Signed)
- Call community health and wellness or another primary care clinic from resource guide to establish primary care. Schedule a follow up appointment in the next few days   Emergency Department Resource Guide 1) Find a Doctor and Pay Out of Pocket Although you won't have to find out who is covered by your insurance plan, it is a good idea to ask around and get recommendations. You will then need to call the office and see if the doctor you have chosen will accept you as a new patient and what types of options they offer for patients who are self-pay. Some doctors offer discounts or will set up payment plans for their patients who do not have insurance, but you will need to ask so you aren't surprised when you get to your appointment.  2) Contact Your Local Health Department Not all health departments have doctors that can see patients for sick visits, but many do, so it is worth a call to see if yours does. If you don't know where your local health department is, you can check in your phone book. The CDC also has a tool to help you locate your state's health department, and many state websites also have listings of all of their local health departments.  3) Find a [facility] If your illness is not likely to be very severe or complicated, you may want to try a walk in clinic. These are popping up all over the country in pharmacies, drugstores, and shopping centers. They're usually staffed by nurse practitioners or physician assistants that have been trained to treat common illnesses and complaints. They're usually fairly quick and inexpensive. However, if you have serious medical issues or chronic medical problems, these are probably not your best option.  No Primary Care Doctor: - Call Health Connect at  [phone] - they can help you locate a primary care doctor that  accepts your insurance, provides certain services, etc. - Physician Referral Service- [phone]  Chronic Pain  Problems: Organization         Address  Phone   Notes  [facility]  [phone] Patients need to be referred by their primary care doctor.   Medication Assistance: Organization         Address  Phone   Notes  [facility] Medication [facility] [address] [place], [place] [address] [phone] --Must be a resident of [facility] -- Must have NO insurance coverage whatsoever (no Medicaid/ Medicare, etc.) -- The pt. MUST have a primary care doctor that directs their care regularly and follows them in the community   [facility]ist  [phone]   [employer]  [phone]    Agencies that provide inexpensive medical care: Organization         Address  Phone   Notes  [place]  [phone]   [facility]    [phone]   [facility] [place],  [phone]   [place] [address] [place] [phone]   Planned Parenthood    [phone]   [facility]    [phone]   [place] and [place] [address], [place] Phone:  [phone], Fax:  [phone] Hours of Operation:  9 am - 6 pm, M-F.  Also accepts Medicaid/Medicare and self-pay.  [facility] for [place] [address] [place],  [address], [place] Phone: [phone], Fax: [phone]. Hours of Operation:  8:30 am - 5:30 pm, M-F.  Also accepts Medicaid and self-pay.  [facility] [place] [address] [place] Phone: [phone]   [place], [place], [place] [phone], Ext. [phone] Mondays & Thursdays: 7-9 AM.  First 15 patients are seen on a first come, first serve basis.    [place] Providers:  Organization         Address  Phone   Notes  [facility] [address], [place] [phone] Also  accepts self-pay patients.  [facility] [address], [place]  [phone]   [place], [place] [phone]   [facility] [address] [place] [phone]   Lucianne Lei [address], [place]   [phone] Only accepts [place] Access [place] patients after they have their name applied to their card.   Self-Pay (no insurance) in [facility]:  Organization         Address  Phone   Notes  Sickle Cell Patients, [facility] [place] [phone]   [facility] [place] [phone]   [facility] [place]  [place], [place], [place] [phone]   [facility]/Dr. Osei-Bonsu  [address] [place] [address], [place] or [address], [place] [phone] Phone number for both [place] and [place] locations is the same.  [facility] [address] [place] [phone]   [facility] [address], [place] or [address] [phone]   [facility] [address], [place] [phone], phone; [phone], fax Sees patients 1st and 3rd Saturday of every month.  Must not qualify for public or private insurance (i.e. Medicaid, Medicare, [place] Health Choice, Veterans' Benefits)  Household income should be no more than 200% of the poverty level The clinic cannot treat you if you are pregnant or think you are pregnant  Sexually transmitted diseases are not treated at the clinic.    Dental Care: Organization         Address  Phone  Notes  [facility] Department of [facility] [phone] Accepts children up to age 14 who are enrolled in [place] or [place]; pregnant  women with a Medicaid card; and children who have applied for Medicaid or [place] Health Choice, but were declined, whose parents can pay a reduced fee at time of service.  [facility] Department of [facility]  [address], [place] [phone] Accepts children up to age 57 who are enrolled in [place] or [place]; pregnant women with a Medicaid card; and children who have applied for Medicaid or [place] Health Choice, but were declined, whose parents can pay a reduced fee at time of service.  [facility] PRO[facility]  [place] [phone] Patients are seen by appointment only. Walk-ins are not accepted. [place] will see patients 65 years of age and older. Monday - Tuesday (8am-5pm) Most Wednesdays (8:30-5pm) $30 per visit, cash only  [place]  [address], [place] [phone] Patients are seen by appointment only. Walk-ins are not accepted. [place] will see patients 47 years of age and older. One Wednesday Evening (Monthly: Volunteer Based).  $30 per visit, cash only  [place]  [phone] for adults; Children under age 20, call Graduate Pediatric Dentistry at [phone]. Children aged 75-14, please call [phone] to request a pediatric application.  Dental services are provided in all areas of dental care including fillings, crowns and bridges, complete and partial dentures, implants, gum treatment, root canals, and extractions. Preventive care is also provided. Treatment is provided to both adults and children. Patients are selected via a lottery and there is often a waiting list.   [facility] [address], [place]  [phone] [email or web address]   [facility] [address] [place], [place] [phone], Ext. [phone] Second and Fourth Thursday of each month, opens at 6:30 AM; Clinic ends at 9 AM.  Patients are  seen on a first-come first-served basis, and a limited number are seen during each clinic.   [facility]  [address] Hillard Danker [place], [place] [phone]   Eligibility Requirements You must have lived in [place], [place], or [place] counties for at least the last three months.   You cannot be eligible for state or federal sponsored [employer], including [employer], [place], or [employer].   You generally cannot be eligible for healthcare insurance through your employer.    How to apply: Eligibility screenings are held every Tuesday and Wednesday afternoon from 1:00 pm until 4:00 pm. You do not need an appointment for the interview!  [facility] [address] [place], [place]   [place]  [place] Department  [place]  [phone]    Behavioral Health Resources in the Community: Intensive Outpatient Programs Organization         Address  Phone  Notes  [place]. [address] [place], [place] [phone]   [facility] [address] [place], [place]   [facility]: [facility] [address] [place], [place]   [place] [address]  [place], [place] or [phone]   Substance Abuse Resources Organization         Address  Phone  Notes  [facility] Drug Services  [phone]   [place]  [phone]   [facility] [place]   Chinita Pester  [phone]   Residential & Outpatient Substance Abuse Program  [phone]   Psychological Services Organization         Address  Phone  Notes  [facility] [place]  [place]  [phone]   [place] [address] [place] or [phone]    Mobile Crisis  Teams Organization         Address  Phone  Notes  [facility], Mobile Crisis Care Unit  [phone]   [facility]  [address] [place], [place]   [place] [address] [place] [phone]    Self-Help/Support Groups Organization         Address  Phone             Notes  [place]. of [place] -  variety of support groups  [phone] Call for more information  Narcotics Anonymous (NA), [facility] [address], [employer] [place]  2 meetings at this location   Residential Facilities manager         Address  Phone  Notes  [facility] [place],    [place]  [phone]   [facility]  [address], [place] [phone], [place], [place]   [place], [place] [phone] Admissions: 8am-3pm M-F  Incentives Substance [place] [address],    [place], [place] [phone]   [facility] [address] [place], [place], [place]   [facility] [address],  [place], [place]   [facility] - Intensive Outpatient [address] Kristeen [address], [place], [place]   [facility] [address].,  [place], [place] [phone] or [phone]   [facility] Services (RTS) [address] [place], [place] Accepts Medicaid  [facility] [place] [address]  [place] [phone] Substance Abuse/Addiction Treatment   [facility] Organization         Address  Phone  Notes  [facility]  [phone]   Domenic Schwab, PhD [address] Arlis Porta [place], [place]   [phone] or [phone]   [place], [place] [phone]   [facility] [address] [place], [place] [phone]  Insurance/Medicaid/sponsorship through [facility] and Families [address]                                    [place], [place] [phone] [place] [address] [place], [place] [phone]    Dr. Adele Schilder  [phone]   [facility] Dept. 1) [address] [place] 2) [place] 3)  [place] [address], [place] [phone]  [phone]   [place] [phone] or [phone] (After Hours)       Chest Pain Observation It is often hard to give a specific diagnosis for the cause of chest pain. Among other possibilities your symptoms might be caused by inadequate oxygen delivery to your heart (angina). Angina that is not treated or evaluated can lead to a heart attack (myocardial infarction) or death. Blood tests, electrocardiograms, and X-rays may have been done to help determine a possible cause of your chest pain. After evaluation and observation, your health care provider has determined that it is unlikely your pain was caused by an unstable condition that requires hospitalization. However, a full evaluation of your pain may need to be completed, with additional diagnostic testing as directed. It is very important to keep your follow-up appointments. Not keeping your follow-up appointments could result in permanent heart damage, disability, or death. If there is any problem keeping your follow-up appointments, you must call your health care provider. HOME CARE INSTRUCTIONS  Due to the slight chance that your pain could be angina, it is important to follow your health care provider's treatment plan and also maintain a healthy lifestyle:  Maintain or work toward achieving a healthy weight.  Stay physically active and exercise regularly.  Decrease your salt intake.  Eat a balanced, healthy diet. Talk to a  dietitian to learn about heart-healthy foods.  Increase your fiber intake by including whole grains, vegetables, fruits, and nuts in your diet.  Avoid situations that cause stress, anger, or depression.  Take medicines as advised by your health care provider. Report any side effects to your health care provider. Do not stop medicines or adjust the dosages on your own.  Quit smoking. Do not use nicotine patches or gum until you check with your health care provider.  Keep your blood pressure, blood sugar, and cholesterol levels within normal limits.  Limit alcohol intake to no more than 1 drink per day for women who are not pregnant and 2 drinks per day for men.  Do not abuse drugs. SEEK IMMEDIATE MEDICAL CARE IF: You have severe chest pain or pressure which may include symptoms such as:  You feel pain or pressure in your arms, neck, jaw, or back.  You have severe back or abdominal pain, feel sick to your stomach (nauseous), or throw up (vomit).  You are sweating profusely.  You are having a fast or irregular heartbeat.  You feel short of breath while at rest.  You notice increasing shortness of breath during rest, sleep, or with activity.  You have chest pain that does not get better after rest or after taking your usual medicine.  You wake from sleep with chest pain.  You are unable to sleep because you cannot breathe.  You develop a frequent cough or you are coughing up blood.  You feel dizzy, faint, or experience extreme fatigue.  You develop severe weakness, dizziness, fainting, or chills. Any of these symptoms may represent a serious problem that is an emergency. Do not wait to see if the symptoms will go away. Call your local emergency services (911 in the U.S.). Do not drive yourself to the hospital. MAKE SURE YOU:  Understand these instructions.  Will watch your condition.  Will get help right away if you are not doing well or get worse.   This information is  not intended to replace advice given to you by your health care provider. Make sure you discuss any questions you have with your health care provider.   Document Released: 10/13/2010 Document Revised: 09/15/2013 Document Reviewed: 03/12/2013 Elsevier Interactive Patient Education 2016 Elsevier Inc.  Nonspecific Chest Pain  Chest pain can be caused by many different conditions. There is always a chance that your pain could be related to something serious, such as a heart attack or a blood clot in your lungs. Chest pain can also be caused by conditions that are not life-threatening. If you have chest pain, it is very important to follow up with your health care provider. CAUSES  Chest pain can be caused by:  Heartburn.  Pneumonia or bronchitis.  Anxiety or stress.  Inflammation around your heart (pericarditis) or lung (pleuritis or pleurisy).  A blood clot in your lung.  A collapsed lung (pneumothorax). It can develop suddenly on its own (spontaneous pneumothorax) or from trauma to the chest.  Shingles infection (varicella-zoster virus).  Heart attack.  Damage to the bones, muscles, and cartilage that make up your chest wall. This can include:  Bruised bones due to injury.  Strained muscles or cartilage due to frequent or repeated coughing or overwork.  Fracture to one or more ribs.  Sore cartilage due to inflammation (costochondritis). RISK FACTORS  Risk factors for chest pain may include:  Activities that  increase your risk for trauma or injury to your chest.  Respiratory infections or conditions that cause frequent coughing.  Medical conditions or overeating that can cause heartburn.  Heart disease or family history of heart disease.  Conditions or health behaviors that increase your risk of developing a blood clot.  Having had chicken pox (varicella zoster). SIGNS AND SYMPTOMS Chest pain can feel like:  Burning or tingling on the surface of your chest or deep in  your chest.  Crushing, pressure, aching, or squeezing pain.  Dull or sharp pain that is worse when you move, cough, or take a deep breath.  Pain that is also felt in your back, neck, shoulder, or arm, or pain that spreads to any of these areas. Your chest pain may come and go, or it may stay constant. DIAGNOSIS Lab tests or other studies may be needed to find the cause of your pain. Your health care provider may have you take a test called an ambulatory ECG (electrocardiogram). An ECG records your heartbeat patterns at the time the test is performed. You may also have other tests, such as:  Transthoracic echocardiogram (TTE). During echocardiography, sound waves are used to create a picture of all of the heart structures and to look at how blood flows through your heart.  Transesophageal echocardiogram (TEE).This is a more advanced imaging test that obtains images from inside your body. It allows your health care provider to see your heart in finer detail.  Cardiac monitoring. This allows your health care provider to monitor your heart rate and rhythm in real time.  Holter monitor. This is a portable device that records your heartbeat and can help to diagnose abnormal heartbeats. It allows your health care provider to track your heart activity for several days, if needed.  Stress tests. These can be done through exercise or by taking medicine that makes your heart beat more quickly.  Blood tests.  Imaging tests. TREATMENT  Your treatment depends on what is causing your chest pain. Treatment may include:  Medicines. These may include:  Acid blockers for heartburn.  Anti-inflammatory medicine.  Pain medicine for inflammatory conditions.  Antibiotic medicine, if an infection is present.  Medicines to dissolve blood clots.  Medicines to treat coronary artery disease.  Supportive care for conditions that do not require medicines. This may include:  Resting.  Applying heat or  cold packs to injured areas.  Limiting activities until pain decreases. HOME CARE INSTRUCTIONS  If you were prescribed an antibiotic medicine, finish it all even if you start to feel better.  Avoid any activities that bring on chest pain.  Do not use any tobacco products, including cigarettes, chewing tobacco, or electronic cigarettes. If you need help quitting, ask your health care provider.  Do not drink alcohol.  Take medicines only as directed by your health care provider.  Keep all follow-up visits as directed by your health care provider. This is important. This includes any further testing if your chest pain does not go away.  If heartburn is the cause for your chest pain, you may be told to keep your head raised (elevated) while sleeping. This reduces the chance that acid will go from your stomach into your esophagus.  Make lifestyle changes as directed by your health care provider. These may include:  Getting regular exercise. Ask your health care provider to suggest some activities that are safe for you.  Eating a heart-healthy diet. A registered dietitian can help you to learn healthy eating options.  Maintaining a healthy weight.  Managing diabetes, if necessary.  Reducing stress. SEEK MEDICAL CARE IF:  Your chest pain does not go away after treatment.  You have a rash with blisters on your chest.  You have a fever. SEEK IMMEDIATE MEDICAL CARE IF:   Your chest pain is worse.  You have an increasing cough, or you cough up blood.  You have severe abdominal pain.  You have severe weakness.  You faint.  You have chills.  You have sudden, unexplained chest discomfort.  You have sudden, unexplained discomfort in your arms, back, neck, or jaw.  You have shortness of breath at any time.  You suddenly start to sweat, or your skin gets clammy.  You feel nauseous or you vomit.  You suddenly feel light-headed or dizzy.  Your heart begins to beat quickly,  or it feels like it is skipping beats. These symptoms may represent a serious problem that is an emergency. Do not wait to see if the symptoms will go away. Get medical help right away. Call your local emergency services (911 in the U.S.). Do not drive yourself to the hospital.   This information is not intended to replace advice given to you by your health care provider. Make sure you discuss any questions you have with your health care provider.   Document Released: 06/20/2005 Document Revised: 10/01/2014 Document Reviewed: 04/16/2014 Elsevier Interactive Patient Education 2016 [employer].  Palpitations A palpitation is the feeling that your heartbeat is irregular or is faster than normal. It may feel like your heart is fluttering or skipping a beat. Palpitations are usually not a serious problem. However, in some cases, you may need further medical evaluation. CAUSES  Palpitations can be caused by:  Smoking.  Caffeine or other stimulants, such as diet pills or energy drinks.  Alcohol.  Stress and anxiety.  Strenuous physical activity.  Fatigue.  Certain medicines.  Heart disease, especially if you have a history of irregular heart rhythms (arrhythmias), such as atrial fibrillation, atrial flutter, or supraventricular tachycardia.  An improperly working pacemaker or defibrillator. DIAGNOSIS  To find the cause of your palpitations, your health care provider will take your medical history and perform a physical exam. Your health care provider may also have you take a test called an ambulatory electrocardiogram (ECG). An ECG records your heartbeat patterns over a 24-hour period. You may also have other tests, such as:  Transthoracic echocardiogram (TTE). During echocardiography, sound waves are used to evaluate how blood flows through your heart.  Transesophageal echocardiogram (TEE).  Cardiac monitoring. This allows your health care provider to monitor your heart rate and  rhythm in real time.  Holter monitor. This is a portable device that records your heartbeat and can help diagnose heart arrhythmias. It allows your health care provider to track your heart activity for several days, if needed.  Stress tests by exercise or by giving medicine that makes the heart beat faster. TREATMENT  Treatment of palpitations depends on the cause of your symptoms and can vary greatly. Most cases of palpitations do not require any treatment other than time, relaxation, and monitoring your symptoms. Other causes, such as atrial fibrillation, atrial flutter, or supraventricular tachycardia, usually require further treatment. HOME CARE INSTRUCTIONS   Avoid:  Caffeinated coffee, tea, soft drinks, diet pills, and energy drinks.  Chocolate.  Alcohol.  Stop smoking if you smoke.  Reduce your stress and anxiety. Things that can help you relax include:  A method of controlling things in your  body, such as your heartbeats, with your mind (biofeedback).  Yoga.  Meditation.  Physical activity such as swimming, jogging, or walking.  Get plenty of rest and sleep. SEEK MEDICAL CARE IF:   You continue to have a fast or irregular heartbeat beyond 24 hours.  Your palpitations occur more often. SEEK IMMEDIATE MEDICAL CARE IF:  You have chest pain or shortness of breath.  You have a severe headache.  You feel dizzy or you faint. MAKE SURE YOU:  Understand these instructions.  Will watch your condition.  Will get help right away if you are not doing well or get worse.   This information is not intended to replace advice given to you by your health care provider. Make sure you discuss any questions you have with your health care provider.   Document Released: 09/07/2000 Document Revised: 09/15/2013 Document Reviewed: 11/09/2011 Elsevier Interactive Patient Education [employer].

## 2015-07-22 ENCOUNTER — Ambulatory Visit: Payer: Medicare Other | Admitting: Physician Assistant

## 2015-07-26 ENCOUNTER — Ambulatory Visit: Payer: Medicare Other | Admitting: Physician Assistant

## 2015-08-05 DIAGNOSIS — M1711 Unilateral primary osteoarthritis, right knee: Secondary | ICD-10-CM | POA: Insufficient documentation

## 2015-09-07 LAB — HEMOGLOBIN A1C: Hemoglobin A1C: 11.3

## 2015-09-21 ENCOUNTER — Institutional Professional Consult (permissible substitution): Payer: Medicare Other | Admitting: Internal Medicine

## 2015-10-10 DIAGNOSIS — G629 Polyneuropathy, unspecified: Secondary | ICD-10-CM | POA: Diagnosis not present

## 2015-10-10 DIAGNOSIS — E1165 Type 2 diabetes mellitus with hyperglycemia: Secondary | ICD-10-CM | POA: Diagnosis not present

## 2015-10-11 ENCOUNTER — Other Ambulatory Visit: Payer: Self-pay | Admitting: Family Medicine

## 2015-10-11 DIAGNOSIS — N644 Mastodynia: Secondary | ICD-10-CM

## 2015-10-12 ENCOUNTER — Other Ambulatory Visit: Payer: Self-pay | Admitting: Family Medicine

## 2015-10-12 DIAGNOSIS — N644 Mastodynia: Secondary | ICD-10-CM

## 2015-10-14 ENCOUNTER — Other Ambulatory Visit: Payer: Medicare Other

## 2015-10-18 ENCOUNTER — Other Ambulatory Visit: Payer: Medicare Other

## 2015-10-18 ENCOUNTER — Ambulatory Visit
Admission: RE | Admit: 2015-10-18 | Discharge: 2015-10-18 | Disposition: A | Discharge status: Home / Self Care | Payer: Medicare Other | Source: Ambulatory Visit | Attending: Family Medicine | Admitting: Family Medicine

## 2015-10-18 DIAGNOSIS — N644 Mastodynia: Secondary | ICD-10-CM | POA: Diagnosis not present

## 2015-10-20 DIAGNOSIS — Z6830 Body mass index (BMI) 30.0-30.9, adult: Secondary | ICD-10-CM | POA: Diagnosis not present

## 2015-10-20 DIAGNOSIS — J069 Acute upper respiratory infection, unspecified: Secondary | ICD-10-CM | POA: Diagnosis not present

## 2015-10-25 ENCOUNTER — Institutional Professional Consult (permissible substitution): Payer: Medicare Other | Admitting: Pulmonary Disease

## 2015-10-25 ENCOUNTER — Encounter: Payer: Self-pay | Admitting: Internal Medicine

## 2015-10-27 DIAGNOSIS — Z794 Long term (current) use of insulin: Secondary | ICD-10-CM | POA: Diagnosis not present

## 2015-10-27 DIAGNOSIS — E1165 Type 2 diabetes mellitus with hyperglycemia: Secondary | ICD-10-CM | POA: Diagnosis not present

## 2015-10-27 DIAGNOSIS — J209 Acute bronchitis, unspecified: Secondary | ICD-10-CM | POA: Diagnosis not present

## 2015-10-27 DIAGNOSIS — J9801 Acute bronchospasm: Secondary | ICD-10-CM | POA: Diagnosis not present

## 2015-10-27 DIAGNOSIS — E039 Hypothyroidism, unspecified: Secondary | ICD-10-CM | POA: Diagnosis not present

## 2015-10-27 DIAGNOSIS — J029 Acute pharyngitis, unspecified: Secondary | ICD-10-CM | POA: Diagnosis not present

## 2015-10-27 DIAGNOSIS — Z6834 Body mass index (BMI) 34.0-34.9, adult: Secondary | ICD-10-CM | POA: Diagnosis not present

## 2015-10-31 ENCOUNTER — Ambulatory Visit: Payer: Medicare Other | Admitting: Internal Medicine

## 2015-11-03 DIAGNOSIS — E039 Hypothyroidism, unspecified: Secondary | ICD-10-CM | POA: Diagnosis not present

## 2015-11-03 DIAGNOSIS — E1165 Type 2 diabetes mellitus with hyperglycemia: Secondary | ICD-10-CM | POA: Diagnosis not present

## 2015-11-03 DIAGNOSIS — Z794 Long term (current) use of insulin: Secondary | ICD-10-CM | POA: Diagnosis not present

## 2015-11-08 DIAGNOSIS — M545 Low back pain: Secondary | ICD-10-CM | POA: Diagnosis not present

## 2015-11-08 DIAGNOSIS — Z6833 Body mass index (BMI) 33.0-33.9, adult: Secondary | ICD-10-CM | POA: Diagnosis not present

## 2015-11-08 DIAGNOSIS — M549 Dorsalgia, unspecified: Secondary | ICD-10-CM | POA: Diagnosis not present

## 2015-11-29 ENCOUNTER — Ambulatory Visit (INDEPENDENT_AMBULATORY_CARE_PROVIDER_SITE_OTHER): Payer: Medicare Other | Admitting: Internal Medicine

## 2015-11-29 ENCOUNTER — Other Ambulatory Visit (INDEPENDENT_AMBULATORY_CARE_PROVIDER_SITE_OTHER): Payer: Medicare Other | Admitting: *Deleted

## 2015-11-29 ENCOUNTER — Encounter: Payer: Self-pay | Admitting: Internal Medicine

## 2015-11-29 VITALS — BP 108/68 | HR 100 | Temp 98.0°F | Resp 12 | Ht 60.0 in | Wt 176.0 lb

## 2015-11-29 DIAGNOSIS — Z794 Long term (current) use of insulin: Secondary | ICD-10-CM | POA: Diagnosis not present

## 2015-11-29 DIAGNOSIS — IMO0001 Reserved for inherently not codable concepts without codable children: Secondary | ICD-10-CM

## 2015-11-29 DIAGNOSIS — Z8639 Personal history of other endocrine, nutritional and metabolic disease: Secondary | ICD-10-CM | POA: Diagnosis not present

## 2015-11-29 DIAGNOSIS — E1165 Type 2 diabetes mellitus with hyperglycemia: Secondary | ICD-10-CM

## 2015-11-29 LAB — POCT GLYCOSYLATED HEMOGLOBIN (HGB A1C): Hemoglobin A1C: 11.3

## 2015-11-29 MED ORDER — GLIPIZIDE 5 MG PO TABS: 5.0000 mg | ORAL_TABLET | Freq: Two times a day (BID) | ORAL | Status: DC

## 2015-11-29 MED ORDER — SITAGLIPTIN PHOSPHATE 100 MG PO TABS: 100.0000 mg | ORAL_TABLET | Freq: Every day | ORAL | Status: DC

## 2015-11-29 NOTE — Patient Instructions (Addendum)
Please increase Lantus to 35 x 3 days, then 40 units at bedtime.   Add: - Glipizide 5 mg 2x a day 15 min before meals - Januvia 100 mg daily in am  Please schedule an appt with Antonieta Iba with nutrition.  Please return in 1.5 months with your sugar log.   PATIENT INSTRUCTIONS FOR TYPE 2 DIABETES:  **Please join MyChart!** - see attached instructions about how to join if you have not done so already.  DIET AND EXERCISE Diet and exercise is an important part of diabetic treatment.  We recommended aerobic exercise in the form of brisk walking (working between 40-60% of maximal aerobic capacity, similar to brisk walking) for 150 minutes per week (such as 30 minutes five days per week) along with 3 times per week performing 'resistance' training (using various gauge rubber tubes with handles) 5-10 exercises involving the major muscle groups (upper body, lower body and core) performing 10-15 repetitions (or near fatigue) each exercise. Start at half the above goal but build slowly to reach the above goals. If limited by weight, joint pain, or disability, we recommend daily walking in a swimming pool with water up to waist to reduce pressure from joints while allow for adequate exercise.    BLOOD GLUCOSES Monitoring your blood glucoses is important for continued management of your diabetes. Please check your blood glucoses 2-4 times a day: fasting, before meals and at bedtime (you can rotate these measurements - e.g. one day check before the 3 meals, the next day check before 2 of the meals and before bedtime, etc.).   HYPOGLYCEMIA (low blood sugar) Hypoglycemia is usually a reaction to not eating, exercising, or taking too much insulin/ other diabetes drugs.  Symptoms include tremors, sweating, hunger, confusion, headache, etc. Treat IMMEDIATELY with 15 grams of Carbs: . 4 glucose tablets .  cup regular juice/soda . 2 tablespoons raisins . 4 teaspoons sugar . 1 tablespoon honey Recheck blood  glucose in 15 mins and repeat above if still symptomatic/blood glucose <100.  RECOMMENDATIONS TO REDUCE YOUR RISK OF DIABETIC COMPLICATIONS: * Take your prescribed MEDICATION(S) * Follow a DIABETIC diet: Complex carbs, fiber rich foods, (monounsaturated and polyunsaturated) fats * AVOID saturated/trans fats, high fat foods, >2,300 mg salt per day. * EXERCISE at least 5 times a week for 30 minutes or preferably daily.  * DO NOT SMOKE OR DRINK more than 1 drink a day. * Check your FEET every day. Do not wear tightfitting shoes. Contact us if you develop an ulcer * See your EYE doctor once a year or more if needed * Get a FLU shot once a year * Get a PNEUMONIA vaccine once before and once after age 13 years  GOALS:  * Your Hemoglobin A1c of <7%  * fasting sugars need to be <130 * after meals sugars need to be <180 (2h after you start eating) * Your Systolic BP should be [id number] or lower  * Your Diastolic BP should be 80 or lower  * Your HDL (Good Cholesterol) should be 40 or higher  * Your LDL (Bad Cholesterol) should be 100 or lower. * Your Triglycerides should be 150 or lower  * Your Urine microalbumin (kidney function) should be <30 * Your Body Mass Index should be 25 or lower    Please consider the following ways to cut down carbs and fat and increase fiber and micronutrients in your diet: - substitute whole grain for white bread or pasta - substitute brown rice for white  rice - substitute 90-calorie flat bread pieces for slices of bread when possible - substitute sweet potatoes or yams for white potatoes - substitute humus for margarine - substitute tofu for cheese when possible - substitute almond or rice milk for regular milk (would not drink soy milk daily due to concern for soy estrogen influence on breast cancer risk) - substitute dark chocolate for other sweets when possible - substitute water - can add lemon or orange slices for taste - for diet sodas (artificial sweeteners  will trick your body that you can eat sweets without getting calories and will lead you to overeating and weight gain in the long run) - do not skip breakfast or other meals (this will slow down the metabolism and will result in more weight gain over time)  - can try smoothies made from fruit and almond/rice milk in am instead of regular breakfast - can also try old-fashioned (not instant) oatmeal made with almond/rice milk in am - order the dressing on the side when eating salad at a restaurant (pour less than half of the dressing on the salad) - eat as little meat as possible - can try juicing, but should not forget that juicing will get rid of the fiber, so would alternate with eating raw veg./fruits or drinking smoothies - use as little oil as possible, even when using olive oil - can dress a salad with a mix of balsamic vinegar and lemon juice, for e.g. - use agave nectar, stevia sugar, or regular sugar rather than artificial sweateners - steam or broil/roast veggies  - snack on veggies/fruit/nuts (unsalted, preferably) when possible, rather than processed foods - reduce or eliminate aspartame in diet (it is in diet sodas, chewing gum, etc) Read the labels!  Try to read Dr. Janene Harvey book: "Program for Reversing Diabetes" for other ideas for healthy eating.  Try to watch "[employer].

## 2015-11-29 NOTE — Progress Notes (Signed)
Patient ID: Lisa Cole, female   DOB: 1981-04-02, 35 y.o.   MRN: [id number]  HPI: Lisa Cole is a 35 y.o.-year-old female, referred by Dr. Zigmund Daniel for management of DM2, dx as GDM (at 35 y/o), then DM2  since 2000 insulin-dependent, uncontrolled, with complications 9PN). She was previously seeing Dr. Meredith Pel and Dr. Chalmers Cater, would like to switch her care to [facility].  PCP: Dr Belva Bertin  Last hemoglobin A1c was: 11/03/2015: HbA1c 12.2% Lab Results  Component Value Date   HGBA1C 11.3 09/07/2015   HGBA1C 6.8* 12/07/2010   HGBA1C 6.5 07/17/2010  06/03/2014: HbA1c 8.5% 11/23/2013: HbA1c 7.9%  Before last HBA1c, per records, she was not taking the prescribed DM meds >> she restarted them >> then off for 2 mo. She was also getting steroid inj x2 in last month.  She was getting steroid inj's every month before.   Pt is on a regimen of: - Lantus 10 >> 30 units at bedtime (last 2 weeks) - may forget 1x a week She was previously on: - Metformin IR and ER >> N/V/D/"spacing out" - Victoza >> dysphagia and rash - Januvia >> no pbs - Actos >> no pbs - Glipizide >> no pbs  Pt checks her sugars 1-2x a day and they are - up to 11/15/2015: - am: 165, 224-315 - 2h after b'fast: 259, 304 - before lunch: 201-301 - 2h after lunch: n/c - before dinner: n/c - 2h after dinner: 315, 328 - bedtime: n/c - nighttime: n/c No lows. Lowest sugar was 165; she has hypoglycemia awareness at 70.  Highest sugar was 300s.  Glucometer: AccuChek Aviva  Pt's meals are: - Breakfast:  salad  + chicken nuggets - [employer] - Lunch: burger + fries + tea or fruit punch or regular Coke or skips - Dinner: fast food - Snacks: no  Exercises 3x a week  - no CKD, last BUN/creatinine:  Lab Results  Component Value Date   BUN 8 07/19/2015   CREATININE 0.61 07/19/2015  09/07/2015: ACR 15 06/03/2014: ACR <2.2 Not on an ACEI/ARB. - last set of lipids: 09/07/2015: 143/143/36/78 Lab Results   Component Value Date   CHOL 111 02/16/2010   HDL 35.50* 02/16/2010   LDLCALC 56 02/16/2010   TRIG 100.0 02/16/2010   CHOLHDL 3 02/16/2010  Not on a statin. - last eye exam was in 10/08/2014. No DR.  - + numbness and tingling in her feet.  Pt has FH of DM in M,F, grandparents, daughter.  She also tells me that she has a history of thyroid nodules. I reviewed the thyroid ultrasound available in Epic, from 03/24/2014 , and there were no worrisome thyroid nodules , only a small stable 3 mm nodule in the right lobe. She apparently had another ultrasound in 11/2014, however this is not available to me. She will bring me the CD with the images and the report at next visit.  Reviewed together her latest thyroid tests from a month ago -  normal:  11/03/2015: TSH 0.89, free T4  0.85 (0.6-1.5)  ROS: Constitutional: + weight gain, + fatigue, no subjective hyperthermia/hypothermia, + nocturia Eyes:+blurry vision, no xerophthalmia ENT: no sore throat, no nodules palpated in throat, no dysphagia/odynophagia, no hoarseness Cardiovascular: + CP/no SOB/+ palpitations/no leg swelling Respiratory: no cough/SOB/+ wheezing Gastrointestinal: + heartburn and  N/no V/D/C Musculoskeletal: no muscle/+ joint aches Skin: no rashes Neurological: no tremors/numbness/tingling/dizziness, + HA Psychiatric:  + both: depression/anxiety  Past Medical History  Diagnosis Date  . Endometriosis   .  Type 2 diabetes mellitus ([place])   . Anemia   . BV (bacterial vaginosis)   . Hepatic steatosis   . Arthritis   . Asthma   . IBS (irritable bowel syndrome)   . Obesity   . Chronic pelvic pain in female   . Hashimoto thyroiditis   . OSA (obstructive sleep apnea)   . Anxiety disorder   . SVT (supraventricular tachycardia) ([place])   . Diabetes mellitus   . IC (interstitial cystitis)   . Hypertension    Past Surgical History  Procedure Laterality Date  . Total abdominal hysterectomy  2004  . Tubal ligation  2001  .  Shoulder surgery  2008    right  . Wrist surgery  2008    right  . Cystostomy w/ bladder biopsy    . Bladder staple     Social History   Social History  . Marital Status: Divorced    Spouse Name: N/A  . Number of Children: 3   Occupational History  .  retirement    Social History Main Topics  . Smoking status: Never Smoker   . Smokeless tobacco: Never Used  . Alcohol Use: No  . Drug Use: No   Social History Narrative   Exercise: yes, working out w/ Physiological scientist   Diet: incorporated more fruits and vegetables    Current Outpatient Prescriptions on File Prior to Visit  Medication Sig Dispense Refill  . acetaminophen (TYLENOL) 325 MG tablet Take 650 mg by mouth every 6 (six) hours as needed for pain.    Marland Kitchen albuterol (PROVENTIL HFA;VENTOLIN HFA) 108 (90 Base) MCG/ACT inhaler Inhale 2 puffs into the lungs as needed.     Marland Kitchen aspirin 81 MG chewable tablet Chew by mouth.    . baclofen (LIORESAL) 10 MG tablet Take by mouth.    . Blood Glucose Monitoring Suppl (ACCU-CHEK AVIVA PLUS) w/Device KIT by Does not apply route. Use to test blood sugar three times daily.    Marland Kitchen glucose blood (ACCU-CHEK AVIVA PLUS) test strip Use to test blood sugar 3 times daily as instructed.    Marland Kitchen ibuprofen (ADVIL,MOTRIN) 400 MG tablet Take 400 mg by mouth every 6 (six) hours as needed for mild pain.    . Insulin Glargine (BASAGLAR KWIKPEN) 100 UNIT/ML Solostar Pen Inject 10 Units into the skin at bedtime.     . Insulin Pen Needle (BD PEN NEEDLE NANO U/F) 32G X 4 MM MISC Use one needle daily to inject insulin.    . metFORMIN (GLUCOPHAGE) 500 MG tablet Take 500 mg by mouth every evening.     . topiramate (TOPAMAX) 25 MG capsule Take by mouth.     No current facility-administered medications on file prior to visit.   Allergies  Allergen Reactions  . Liraglutide Hives, Itching and Other (See Comments)    "Burning sensation" "Burning sensation"  . Codeine Nausea Only  . Amoxicillin Swelling and Rash  .  Metformin Diarrhea    "GI upset even with Metformin XR"  . Penicillins Swelling and Rash    Has patient had a PCN reaction causing immediate rash, facial/tongue/throat swelling, SOB or lightheadedness with hypotension: Yes Has patient had a PCN reaction causing severe rash involving mucus membranes or skin necrosis: No Has patient had a PCN reaction that required hospitalization Yes Has patient had a PCN reaction occurring within the last 10 years: No If all of the above answers are "NO", then may proceed with Cephalosporin use.    Family History  Problem Relation Age of Onset  . Alcohol abuse    . Arthritis Maternal Grandmother   . Diabetes Daughter     type 2  . Hypertension    . Hyperlipidemia    . Kidney disease    . Colon cancer      uncle  . Prostate cancer      uncles and grandfather  . Cirrhosis      both grandmother and grandfather  . Lung cancer    . Heart disease    . Thyroid disease    . Colon polyps    . Liver disease    . Hypertension Father    PE: BP 108/68 mmHg  Pulse 100  Temp(Src) 98 F (36.7 C) (Oral)  Resp 12  Ht 5' (1.524 m)  Wt 176 lb (79.833 kg)  BMI 34.37 kg/m2  SpO2 98% Body mass index is 34.37 kg/(m^2).  Wt Readings from Last 3 Encounters:  11/29/15 176 lb (79.833 kg)  07/19/15 180 lb (81.647 kg)  06/14/14 181 lb (82.101 kg)   Constitutional:  obese -  Central  distribution, in NAD Eyes: PERRLA, EOMI, no exophthalmos ENT: moist mucous membranes, no thyromegaly, no cervical lymphadenopathy Cardiovascular: RRR, No MRG Respiratory: CTA B Gastrointestinal: abdomen soft, NT, ND, BS+ Musculoskeletal: no deformities, strength intact in all 4 Skin: moist, warm, no rashes Neurological: no tremor with outstretched hands, DTR normal in all 4  ASSESSMENT: 1. DM2, insulin-dependent, uncontrolled, without complications  2.  History of thyroid nodules  PLAN:  1. Patient with long-standing, uncontrolled diabetes, on  Basal insulin only , with  history of medication noncompliance , and with a very poor diet consisting mostly of fast food. We discussed at length about the fact that other she starts to improve her diet, her diabetes will continue to be out of control. She also has fatty liver disease, which will continue to worsen if she continues to only eat so much fast food. She has seen the  "Supersize me" documentary, and is aware of the consequences of continuing with his diet , however she mentions that she cannot stop. I suggested a plant-based diet to cleanse her system and I also referred her to nutrition.  In the meantime, we'll increase the Lantus dose and add glipizide and Januvia to cover her meals. - I suggested to:  Patient Instructions  Please increase Lantus to 35 x 3 days, then 40 units at bedtime.   Add: - Glipizide 5 mg 2x a day 15 min before meals - Januvia 100 mg daily in am  Please schedule an appt with Antonieta Iba with nutrition.  Please return in 1.5 months with your sugar log.   - Strongly advised her to start checking sugars at different times of the day - check 2 times a day, rotating checks - given sugar log and advised how to fill it and to bring it at next appt  - given foot care handout and explained the principles  - given instructions for hypoglycemia management "15-15 rule"  - advised for yearly eye exams >>  Needs a new eye exam - Return to clinic in 1.5 mo with sugar log   2.  History of thyroid nodules -  Reviewed together the ultrasound report available in Epic: only a small 3 mm thyroid nodule. I would not recommend continuing the ultrasounds for her , but will need to check the new ultrasound report from 11/2014, which the patient will bring me at next visit -  Also reviewed together latest TFTs from 11/03/2015, which were normal -  No neck compression symptoms

## 2015-11-30 DIAGNOSIS — Z8639 Personal history of other endocrine, nutritional and metabolic disease: Secondary | ICD-10-CM | POA: Insufficient documentation

## 2015-11-30 DIAGNOSIS — E1165 Type 2 diabetes mellitus with hyperglycemia: Secondary | ICD-10-CM | POA: Insufficient documentation

## 2015-11-30 DIAGNOSIS — Z794 Long term (current) use of insulin: Principal | ICD-10-CM

## 2015-12-08 ENCOUNTER — Telehealth: Payer: Self-pay | Admitting: Internal Medicine

## 2015-12-08 DIAGNOSIS — K210 Gastro-esophageal reflux disease with esophagitis: Secondary | ICD-10-CM | POA: Diagnosis not present

## 2015-12-08 DIAGNOSIS — Z6833 Body mass index (BMI) 33.0-33.9, adult: Secondary | ICD-10-CM | POA: Diagnosis not present

## 2015-12-08 NOTE — Telephone Encounter (Signed)
Called pt and lvm advising her per Dr Arman Filter message. Advised pt to call us and let us know how she is doing and what her sugar levels are.

## 2015-12-08 NOTE — Telephone Encounter (Signed)
I do not think it is the medicines, none of them are known to cause chest pain. How are the sugars? Any lows or highs? Please contact PCP immediately about the chest pain or go to the emergency room if PCP not available!

## 2015-12-08 NOTE — Telephone Encounter (Signed)
Pt is having trouble swallowing her food and seems to be having chest pain she wonders if it is meds, please advise

## 2015-12-08 NOTE — Telephone Encounter (Signed)
Please read message below and advise.  

## 2015-12-29 ENCOUNTER — Encounter: Payer: Medicare Other | Attending: Internal Medicine | Admitting: Dietician

## 2015-12-29 ENCOUNTER — Other Ambulatory Visit: Payer: Self-pay | Admitting: *Deleted

## 2015-12-29 ENCOUNTER — Encounter: Payer: Self-pay | Admitting: Dietician

## 2015-12-29 VITALS — Ht 60.0 in | Wt 179.0 lb

## 2015-12-29 DIAGNOSIS — E118 Type 2 diabetes mellitus with unspecified complications: Secondary | ICD-10-CM

## 2015-12-29 DIAGNOSIS — E1165 Type 2 diabetes mellitus with hyperglycemia: Secondary | ICD-10-CM | POA: Diagnosis not present

## 2015-12-29 DIAGNOSIS — Z794 Long term (current) use of insulin: Secondary | ICD-10-CM | POA: Insufficient documentation

## 2015-12-29 DIAGNOSIS — IMO0002 Reserved for concepts with insufficient information to code with codable children: Secondary | ICD-10-CM

## 2015-12-29 MED ORDER — INSULIN GLARGINE 100 UNIT/ML SOLOSTAR PEN: 40.0000 [IU] | PEN_INJECTOR | Freq: Every day | SUBCUTANEOUS | Status: DC

## 2015-12-29 NOTE — Patient Instructions (Signed)
Resume the exercise habit! Aim for 30-60 minutes most days. Rethink your beverages (choose unsweetened beverages) Continue to be educated with your food choices when eating out. Breakfast, lunch, dinner every day.  Don't skip meals. Choose low fat. 1/2 of your plate should be non starchy vegetables.

## 2015-12-29 NOTE — Progress Notes (Signed)
  Medical Nutrition Therapy:  Appt start time: 0930 end time:  1000. Patient arrived late and worked into schedule late due to previous walk in appointment.  She is receptive for follow up.  Assessment:  Primary concerns today: Patient is here alone.  She would like to better control her type 2 diabetes.  She was diagnosed with gestational diabetes during her first pregnancy and has had tyype 2 diabetes since.  It is uncontrolled and patient is very frustrated by her lack of control and diabetes in general.   States that she feels that diabetes and other health problems are controlling her life.  She is now taking her medication consistently and forgets her insulin only rarely.  She used to neglect to take it over her frustration with the disease.  She was very nervious to come to this appointment.  She lives with her 3 children and is responsible for the shopping and cooking.  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    Preferred Learning Style:   No preference indicated   Learning Readiness:   Not ready  Contemplating  Ready  Change in progress   MEDICATIONS: see list to include glucotrol, 50 units insulin Glargine q HS and has a prescription for Januvia but is not taking this due to expense.  She has also not started the lisinopril yet.   DIETARY INTAKE: She reports starting to become more mindful about her choices when eating out.  She reduced her soda intake from 80 ounces per day to occasional but has started to substitute sweet tea instead.  Full dietary hxx not obtained due to shortened visit.  She will often skip meals.  24-hr recall:  B ( AM):   Snk ( AM):   L ( PM):  Snk ( PM):  D ( PM):  Snk ( PM):  Beverages: occasional now coke or tropicana fruit punch, water, sweet tea  Usual physical activity: water aerobics and dance classes but not for the last month and  further limited due to knee problems  Estimated energy needs: 1400 calories 158 g carbohydrates 88 g protein 47 g fat  Progress Towards Goal(s):  In progress.   Nutritional Diagnosis:  NB-1.1 Food and nutrition-related knowledge deficit As related to balance of carbohydrate, protein, and fat.  As evidenced by patient report and beverage intake.    Intervention:  Nutrition counseling/education started to work to modify carbohydrate intake and increase exercise as able.  Benefits on blood sugar control discussed.  Discussed healthy options for breakfast and importance of regularly scheduled meals.  Resume the exercise habit! Aim for 30-60 minutes most days. Rethink your beverages (choose unsweetened beverages) Continue to be educated with your food choices when eating out. Breakfast, lunch, dinner every day.  Don't skip meals. Choose low fat. 1/2 of your plate should be non starchy vegetables.  Teaching Method Utilized:  Visual Auditory Hands on  Handouts given during visit include:  My plate  Planning healthy meals  Breakfast ideas  Barriers to learning/adherence to lifestyle change: acceptance of disease  Demonstrated degree of understanding via:  Teach Back   Monitoring/Evaluation:  Dietary intake, exercise, label reading, and body weight in 3 week(s).

## 2016-01-10 DIAGNOSIS — E1165 Type 2 diabetes mellitus with hyperglycemia: Secondary | ICD-10-CM | POA: Diagnosis not present

## 2016-01-10 DIAGNOSIS — R5383 Other fatigue: Secondary | ICD-10-CM | POA: Diagnosis not present

## 2016-01-10 DIAGNOSIS — D6489 Other specified anemias: Secondary | ICD-10-CM | POA: Diagnosis not present

## 2016-01-10 DIAGNOSIS — R52 Pain, unspecified: Secondary | ICD-10-CM | POA: Diagnosis not present

## 2016-01-10 DIAGNOSIS — E538 Deficiency of other specified B group vitamins: Secondary | ICD-10-CM | POA: Diagnosis not present

## 2016-01-10 DIAGNOSIS — Z794 Long term (current) use of insulin: Secondary | ICD-10-CM | POA: Diagnosis not present

## 2016-01-10 DIAGNOSIS — E559 Vitamin D deficiency, unspecified: Secondary | ICD-10-CM | POA: Diagnosis not present

## 2016-01-10 DIAGNOSIS — R000 Tachycardia, unspecified: Secondary | ICD-10-CM | POA: Diagnosis not present

## 2016-01-13 ENCOUNTER — Ambulatory Visit: Payer: Medicare Other | Admitting: Internal Medicine

## 2016-01-20 ENCOUNTER — Ambulatory Visit: Payer: Medicare Other | Admitting: Dietician

## 2016-01-25 ENCOUNTER — Encounter: Payer: Self-pay | Admitting: Pulmonary Disease

## 2016-01-25 ENCOUNTER — Ambulatory Visit (INDEPENDENT_AMBULATORY_CARE_PROVIDER_SITE_OTHER): Payer: Medicare Other | Admitting: Pulmonary Disease

## 2016-01-25 VITALS — BP 112/78 | HR 87 | Ht 60.0 in | Wt 182.0 lb

## 2016-01-25 DIAGNOSIS — J45909 Unspecified asthma, uncomplicated: Secondary | ICD-10-CM | POA: Insufficient documentation

## 2016-01-25 DIAGNOSIS — R05 Cough: Secondary | ICD-10-CM

## 2016-01-25 DIAGNOSIS — G4733 Obstructive sleep apnea (adult) (pediatric): Secondary | ICD-10-CM

## 2016-01-25 DIAGNOSIS — J4520 Mild intermittent asthma, uncomplicated: Secondary | ICD-10-CM

## 2016-01-25 DIAGNOSIS — E669 Obesity, unspecified: Secondary | ICD-10-CM | POA: Diagnosis not present

## 2016-01-25 DIAGNOSIS — K219 Gastro-esophageal reflux disease without esophagitis: Secondary | ICD-10-CM

## 2016-01-25 DIAGNOSIS — R059 Cough, unspecified: Secondary | ICD-10-CM

## 2016-01-25 MED ORDER — BUDESONIDE-FORMOTEROL FUMARATE 160-4.5 MCG/ACT IN AERO: 2.0000 | INHALATION_SPRAY | Freq: Two times a day (BID) | RESPIRATORY_TRACT | Status: AC

## 2016-01-25 NOTE — Assessment & Plan Note (Signed)
Recent flare 2/2 flu. Uses alb almost daily. Start symbicort 160/4.5 2p BID. Alb prn. May need pred. Pt to call if worse.

## 2016-01-25 NOTE — Assessment & Plan Note (Signed)
As you very well know, patient was dxed with OSA in [place] in 2010. Mild to severe OSA. AHI 12. Worse in REM sleep.  She was given cpap.She was using it. No issues with it. Had a FFM.  Did not necessarily have issues with it.  She changed insurance and was given a new machine in 2015.  The machine is not working well. Not enough air comes out. It is missing a piece.  She mentioned this to DME [employer] and was advised to start all over.   Pt is very symptomatic -- has hypersomnia, sleepiness, snoring, witnessed apneas, gasping,choking. Has daily naps. (-) abnormal behavior in sleep. Has legs bothering her.   Hypersomnia affects fxnality.  Gets sleepy driving.    Plan : 1. Spoke to. DME. We can order auto CPAP without having a new sleep study. autocpap 5-15 cm h2O 2. Needs to be seen 31 days after getting machine per Medicare to assess compliance and efficacy. 3. Anticipate no issues with cpap.

## 2016-01-25 NOTE — Progress Notes (Signed)
Subjective:    Patient ID: Lisa Cole, female    DOB: 12/06/1980, 35 y.o.   MRN: [id number]  HPI   This is the case of Lisa Cole, 35 y.o. Female, who was referred by  Dr. Anselm Pancoast and Dr. Orie Rout in consultation regarding OSA.   As you very well know, patient was dxed with OSA in [place] in 2010. Mild to severe OSA. AHI 12. Worse in REM sleep.  She was given cpap.She was using it. No issues with it. Had a FFM.  Did not necessarily have issues with it.  She changed insurance and was given a new machine in 2015.  The machine is not working well. Not enough air comes out. It is missing a piece.  She mentioned this to DME [facility] and was advised to start all over.   Pt is very symptomatic -- has hypersomnia, sleepiness, snoring, witnessed apneas, gasping,choking. Has daily naps. (-) abnormal behavior in sleep. Has legs bothering her.   Hypersomnia affects fxnality.  Gets sleepy driving.    Pt has asthma as a child. Recent exacerbation 2/2 flu. Uses alb 2-4 x/d daily x 2 weeks.   Non smoker.     Review of Systems  Constitutional: Negative.  Negative for fever and unexpected weight change.  HENT: Positive for congestion and sinus pressure. Negative for dental problem, ear pain, nosebleeds, postnasal drip, rhinorrhea, sneezing, sore throat and trouble swallowing.   Eyes: Positive for itching. Negative for redness.  Respiratory: Positive for cough, shortness of breath and wheezing. Negative for chest tightness.   Cardiovascular: Positive for palpitations and leg swelling.  Gastrointestinal: Negative.  Negative for nausea and vomiting.  Endocrine: Negative.   Genitourinary: Negative.  Negative for dysuria.  Musculoskeletal: Positive for joint swelling and arthralgias.  Skin: Positive for rash.  Allergic/Immunologic: Negative.   Neurological: Positive for dizziness, light-headedness and headaches.  Hematological: Bruises/bleeds easily.    Psychiatric/Behavioral: Negative.  Negative for dysphoric mood. The patient is not nervous/anxious.    Past Medical History  Diagnosis Date  . Endometriosis   . Type 2 diabetes mellitus ([place])   . Anemia   . BV (bacterial vaginosis)   . Hepatic steatosis   . Arthritis   . Asthma   . IBS (irritable bowel syndrome)   . Obesity   . Chronic pelvic pain in female   . Hashimoto thyroiditis   . OSA (obstructive sleep apnea)   . Anxiety disorder   . SVT (supraventricular tachycardia) ([place])   . Diabetes mellitus   . IC (interstitial cystitis)   . Hypertension     (-) CA.  (-) DVT  Family History  Problem Relation Age of Onset  . Alcohol abuse    . Arthritis Maternal Grandmother   . Diabetes Daughter     type 2  . Hypertension    . Hyperlipidemia    . Kidney disease    . Colon cancer      uncle  . Prostate cancer      uncles and grandfather  . Cirrhosis      both grandmother and grandfather  . Lung cancer    . Heart disease    . Thyroid disease    . Colon polyps    . Liver disease    . Hypertension Father      Past Surgical History  Procedure Laterality Date  . Total abdominal hysterectomy  2004  . Tubal ligation  2001  . Shoulder surgery  2008  right  . Wrist surgery  2008    right  . Cystostomy w/ bladder biopsy    . Bladder staple      Social History   Social History  . Marital Status: Divorced    Spouse Name: N/A  . Number of Children: N/A  . Years of Education: N/A   Occupational History  . Teacher [employer]   Social History Main Topics  . Smoking status: Never Smoker   . Smokeless tobacco: Never Used  . Alcohol Use: No  . Drug Use: No  . Sexual Activity: Not Currently   Other Topics Concern  . Not on file   Social History Narrative   Exercise: yes, working out w/ Physiological scientist   Diet: incorporated more fruits and vegetables       Single. Has 3 children. On disability 2/2 trauma at work.   Allergies  Allergen  Reactions  . Liraglutide Hives, Itching and Other (See Comments)    "Burning sensation" "Burning sensation"  . Codeine Nausea Only  . Amoxicillin Swelling and Rash  . Metformin Diarrhea    "GI upset even with Metformin XR"  . Penicillins Swelling and Rash    Has patient had a PCN reaction causing immediate rash, facial/tongue/throat swelling, SOB or lightheadedness with hypotension: Yes Has patient had a PCN reaction causing severe rash involving mucus membranes or skin necrosis: No Has patient had a PCN reaction that required hospitalization Yes Has patient had a PCN reaction occurring within the last 10 years: No If all of the above answers are "NO", then may proceed with Cephalosporin use.      Outpatient Prescriptions Prior to Visit  Medication Sig Dispense Refill  . acetaminophen (TYLENOL) 325 MG tablet Take 650 mg by mouth every 6 (six) hours as needed for pain.    Marland Kitchen albuterol (PROVENTIL HFA;VENTOLIN HFA) 108 (90 Base) MCG/ACT inhaler Inhale 2 puffs into the lungs as needed.     Marland Kitchen aspirin 81 MG chewable tablet Chew by mouth. Reported on 11/29/2015    . glipiZIDE (GLUCOTROL) 5 MG tablet Take 1 tablet (5 mg total) by mouth 2 (two) times daily before a meal. 60 tablet 2  . ibuprofen (ADVIL,MOTRIN) 400 MG tablet Take 400 mg by mouth every 6 (six) hours as needed for mild pain.    . Insulin Glargine (LANTUS) 100 UNIT/ML Solostar Pen Inject 40 Units into the skin at bedtime. 15 mL 1  . Insulin Pen Needle (BD PEN NEEDLE NANO U/F) 32G X 4 MM MISC Use one needle daily to inject insulin.    Marland Kitchen lisinopril (PRINIVIL,ZESTRIL) 5 MG tablet Take 5 mg by mouth daily. Reported on 12/29/2015  0  . sitaGLIPtin (JANUVIA) 100 MG tablet Take 1 tablet (100 mg total) by mouth daily. (Patient not taking: Reported on 12/29/2015) 30 tablet 2   No facility-administered medications prior to visit.   Meds ordered this encounter  Medications  . Cholecalciferol (D3-50) 50000 units capsule    Sig: Take 1 tablet by  mouth once a week.  Marland Kitchen omeprazole (PRILOSEC) 20 MG capsule    Sig: Take 1 capsule by mouth daily.  . budesonide-formoterol (SYMBICORT) 160-4.5 MCG/ACT inhaler    Sig: Inhale 2 puffs into the lungs 2 (two) times daily.    Dispense:  1 Inhaler    Refill:  6          Objective:   Physical Exam   Vitals:  Filed Vitals:   01/25/16 1340  BP: 112/78  Pulse: 87  Height: 5' (1.524 m)  Weight: 182 lb (82.555 kg)  SpO2: 96%    Constitutional/General:  Pleasant, well-nourished, well-developed, not in any distress,  Comfortably seating.  Well kempt  Body mass index is 35.54 kg/(m^2). Wt Readings from Last 3 Encounters:  01/25/16 182 lb (82.555 kg)  12/29/15 179 lb (81.194 kg)  11/29/15 176 lb (79.833 kg)    Neck circumference: 17 inches  HEENT: Pupils equal and reactive to light and accommodation. Anicteric sclerae. Normal nasal mucosa.   No oral  lesions,  mouth clear,  oropharynx clear, no postnasal drip. (-) Oral thrush. No dental caries.  Airway - Mallampati class III-IV  Neck: No masses. Midline trachea. No JVD, (-) LAD. (-) bruits appreciated.  Respiratory/Chest: Grossly normal chest. (-) deformity. (-) Accessory muscle use.  Symmetric expansion. (-) Tenderness on palpation.  Resonant on percussion.  Diminished BS on both lower lung zones. (-) wheezing, crackles, rhonchi (-) egophony  Cardiovascular: Regular rate and  rhythm, heart sounds normal, no murmur or gallops, no peripheral edema  Gastrointestinal:  Normal bowel sounds. Soft, non-tender. No hepatosplenomegaly.  (-) masses.   Musculoskeletal:  Normal muscle tone. Normal gait.   Extremities: Grossly normal. (-) clubbing, cyanosis.  (-) edema  Skin: (-) rash,lesions seen.   Neurological/Psychiatric : alert, oriented to time, place, person. Normal mood and affect           Assessment & Plan:  OSA (obstructive sleep apnea) As you very well know, patient was dxed with OSA in [place] in 2010.  Mild to severe OSA. AHI 12. Worse in REM sleep.  She was given cpap.She was using it. No issues with it. Had a FFM.  Did not necessarily have issues with it.  She changed insurance and was given a new machine in 2015.  The machine is not working well. Not enough air comes out. It is missing a piece.  She mentioned this to DME [facility] and was advised to start all over.   Pt is very symptomatic -- has hypersomnia, sleepiness, snoring, witnessed apneas, gasping,choking. Has daily naps. (-) abnormal behavior in sleep. Has legs bothering her.   Hypersomnia affects fxnality.  Gets sleepy driving.    Plan : 1. Spoke to. DME. We can order auto CPAP without having a new sleep study. autocpap 5-15 cm h2O 2. Needs to be seen 31 days after getting machine per Medicare to assess compliance and efficacy. 3. Anticipate no issues with cpap.   Cough Has sinus issues/allergies. Try OTC allegra or zyrtec. May need flonase.   Asthma Recent flare 2/2 flu. Uses alb almost daily. Start symbicort 160/4.5 2p BID. Alb prn. May need pred. Pt to call if worse.   OBESITY, UNSPECIFIED Weight reduction  GERD (gastroesophageal reflux disease) Diet changes. PPI at HS.    Thank you very much for letting me participate in this patient's care. Please do not hesitate to give me a call if you have any questions or concerns regarding the treatment plan.   Patient will follow up with me in July    J. Shirl Harris, MD 01/25/2016   2:18 PM [facility] [place] Pager: [phone] Office: [phone], Fax: [phone]

## 2016-01-25 NOTE — Assessment & Plan Note (Signed)
Diet changes. PPI at HS.

## 2016-01-25 NOTE — Patient Instructions (Signed)
1. We will order you an auto CPAP machine, 5-15 cm water. Please let us know if you are  having issues with it or if you have not received your machine in 1-2 weeks. 2. We will start given Symbicort, 160/4.5. 2 puffs twice a day. Let us know if your asthma flares up.  Return to clinic in 2 mos.

## 2016-01-25 NOTE — Assessment & Plan Note (Signed)
Weight reduction 

## 2016-01-25 NOTE — Assessment & Plan Note (Signed)
Has sinus issues/allergies. Try OTC allegra or zyrtec. May need flonase.

## 2016-01-26 ENCOUNTER — Telehealth: Payer: Self-pay | Admitting: Pulmonary Disease

## 2016-01-26 DIAGNOSIS — G4733 Obstructive sleep apnea (adult) (pediatric): Secondary | ICD-10-CM

## 2016-01-26 NOTE — Telephone Encounter (Signed)
Order has been placed per AD. Will need to call Melissa in the morning to make her aware of this.

## 2016-01-26 NOTE — Telephone Encounter (Signed)
Spoke with Melissa at [facility], states that pt is currently with [facility], but pt is switching to [facility] per AD.  Since pt has documented noncompliance she will have to start over with her cpap process- will have to order sleep study to prove need for cpap before pt can receive new machine through [facility].  AD please advise on which sleep study you'd like to order for pt.  thanks

## 2016-01-26 NOTE — Telephone Encounter (Signed)
pls order a split night study if insurance will cover. Dx is OSA. If not, HST will work.   AD

## 2016-01-27 NOTE — Telephone Encounter (Signed)
Lisa Cole is aware that we have placed this order. Nothing further was needed.

## 2016-01-29 ENCOUNTER — Ambulatory Visit (HOSPITAL_BASED_OUTPATIENT_CLINIC_OR_DEPARTMENT_OTHER): Payer: Medicare Other | Attending: Pulmonary Disease | Admitting: Pulmonary Disease

## 2016-01-29 VITALS — Ht 60.0 in | Wt 182.0 lb

## 2016-01-29 DIAGNOSIS — R0683 Snoring: Secondary | ICD-10-CM | POA: Insufficient documentation

## 2016-01-29 DIAGNOSIS — G4733 Obstructive sleep apnea (adult) (pediatric): Secondary | ICD-10-CM

## 2016-02-03 NOTE — Procedures (Signed)
Patient Name: Lisa Cole, Lisa Cole   Study Date: 01/29/2016   Gender: Female  D.O.B: 1981-09-15  Age (years): 35  Referring Provider: Izard   Height (inches): 60  Interpreting Physician: [place]ine   Weight (lbs): 182  RPSGT: Madelon Lips   BMI: 36  MRN: [id number]  Neck Size: 15.50     CLINICAL INFORMATION  Sleep Study Type: Split Night CPAP  Indication for sleep study: OSA  Epworth Sleepiness Score: 12   SLEEP STUDY TECHNIQUE  As per the AASM Manual for the Scoring of Sleep and Associated Events v2.3 (Anesha 2016) with a hypopnea requiring 4% desaturations.  The channels recorded and monitored were frontal, central and occipital EEG, electrooculogram (EOG), submentalis EMG (chin), nasal and oral airflow, thoracic and abdominal wall motion, anterior tibialis EMG, snore microphone, electrocardiogram, and pulse oximetry. Continuous positive airway pressure (CPAP) was initiated when the patient met split night criteria and was titrated according to treat sleep-disordered breathing.  MEDICATIONS  Medications taken by the patient : N/A  Medications administered by patient during sleep study : No sleep medicine administered.  Medication : per chart review.   RESPIRATORY PARAMETERS  Diagnostic Total AHI (/hr):  14.6 RDI (/hr): 17.7 OA Index (/hr):  1.8 CA Index (/hr):  0.0  REM AHI (/hr):  56.7 NREM AHI (/hr):  8.2 Supine AHI (/hr):  N/A Non-supine AHI (/hr):  14.62  Min O2 Sat (%): 87.00 Mean O2 (%): 94.68 Time below 88% (min): 0.2    Titration Optimal Pressure (cm): 11 AHI at Optimal Pressure (/hr): 0.0 Min O2 at Optimal Pressure (%): 94.0  Supine % at Optimal (%): 0 Sleep % at Optimal (%): 97     SLEEP ARCHITECTURE  The recording time for the entire night was 422.2 minutes. During a baseline period of 190.4 minutes, the patient slept for 135.5 minutes in REM and nonREM, yielding a sleep efficiency of 71.1%. Sleep onset after lights out was 15.5 minutes  with a REM latency of 138.5 minutes. The patient spent 15.11% of the night in stage N1 sleep, 71.61% in stage N2 sleep, 0.00% in stage N3 and 13.29% in REM.  During the titration period of 223.4 minutes, the patient slept for 211.5 minutes in REM and nonREM, yielding a sleep efficiency of 94.7%. Sleep onset after CPAP initiation was 4.6 minutes with a REM latency of 60.0 minutes. The patient spent 7.09% of the night in stage N1 sleep, 69.98% in stage N2 sleep, 0.00% in stage N3 and 22.93% in REM.  CARDIAC DATA  The 2 lead EKG demonstrated sinus rhythm. The mean heart rate was 80.48 beats per minute. Other EKG findings include: None.   LEG MOVEMENT DATA  The total Periodic Limb Movements of Sleep (PLMS) were 0. The PLMS index was 0.00 .  IMPRESSIONS  Mild-moderate obstructive sleep apnea occurred during the diagnostic portion of the study (AHI = 14.6 /hour). An adequate PAP pressure was selected for this patient ( 11 cm of water). His AHI was O with this setting but he did NOT have REM sleep with this setting. No significant central sleep apnea occurred during the diagnostic portion of the study (CAI = 0.0/hour). Mild oxygen desaturation was noted during the diagnostic portion of the study (Min O2 = 87.00%). The patient snored with Loud snoring volume during the diagnostic portion of the study. No cardiac abnormalities were noted during this study. Clinically significant periodic limb movements did not occur during sleep.   DIAGNOSIS  Obstructive Sleep Apnea (327.23 [G47.33 ICD-10])   RECOMMENDATIONS  Trial of autoCPAP therapy on 5-15 cm H2O with a Small size Resmed Nasal Mask AirFit N10 mask, chin strap, and heated humidification. He will need a 1 month download on autocpap to determine CPAP efficacy. Avoid alcohol, sedatives and other CNS depressants that may worsen sleep apnea and disrupt normal sleep architecture. Sleep hygiene should be reviewed to assess factors that may improve sleep  quality. Weight management and regular exercise should be initiated or continued. Return to office 4-6 weeks after obtaining CPAP machine.  Monica Becton, MD 02/03/2016, 3:42 PM [place] [facility] Pager [phone] After 3 pm or if no answer, call [phone]

## 2016-02-06 ENCOUNTER — Telehealth: Payer: Self-pay | Admitting: Pulmonary Disease

## 2016-02-06 DIAGNOSIS — G4733 Obstructive sleep apnea (adult) (pediatric): Secondary | ICD-10-CM

## 2016-02-06 NOTE — Telephone Encounter (Signed)
  Please call the pt and tell the pt the LAB SLEEP STUDY  showed OSA =  Pt stops breathing  15  times an hour.   Please order autoCPAP 5-15 cm H2O. Patient will need a mask fitting session. Patient will need a 1 month download.   Patient needs to be seen 4-6 weeks after obtaining the cpap machine. Let me know if you receive this.   Thanks!   J. Shirl Harris, MD 02/06/2016, 5:12 AM

## 2016-02-07 NOTE — Telephone Encounter (Signed)
LMTCB

## 2016-02-08 NOTE — Telephone Encounter (Signed)
Spoke with pt and gave results and recommendations. Pt agrees to start CPAP therapy. Order placed. Pt aware to call office and schedule f/u appt once starts CPAP. Nothing further needed.  

## 2016-02-08 NOTE — Telephone Encounter (Signed)
Patient returned call, CB is [phone]

## 2016-02-23 ENCOUNTER — Encounter (HOSPITAL_BASED_OUTPATIENT_CLINIC_OR_DEPARTMENT_OTHER): Payer: Medicare Other

## 2016-03-04 ENCOUNTER — Emergency Department (HOSPITAL_COMMUNITY)
Admission: EM | Admit: 2016-03-04 | Discharge: 2016-03-04 | Disposition: A | Discharge status: Home / Self Care | Payer: Medicare Other | Attending: Emergency Medicine | Admitting: Emergency Medicine

## 2016-03-04 ENCOUNTER — Encounter (HOSPITAL_COMMUNITY): Payer: Self-pay

## 2016-03-04 ENCOUNTER — Emergency Department (HOSPITAL_COMMUNITY): Payer: Medicare Other

## 2016-03-04 DIAGNOSIS — Z794 Long term (current) use of insulin: Secondary | ICD-10-CM | POA: Diagnosis not present

## 2016-03-04 DIAGNOSIS — Z79899 Other long term (current) drug therapy: Secondary | ICD-10-CM | POA: Diagnosis not present

## 2016-03-04 DIAGNOSIS — J45909 Unspecified asthma, uncomplicated: Secondary | ICD-10-CM | POA: Insufficient documentation

## 2016-03-04 DIAGNOSIS — E1165 Type 2 diabetes mellitus with hyperglycemia: Secondary | ICD-10-CM | POA: Diagnosis not present

## 2016-03-04 DIAGNOSIS — M1990 Unspecified osteoarthritis, unspecified site: Secondary | ICD-10-CM | POA: Insufficient documentation

## 2016-03-04 DIAGNOSIS — Z7982 Long term (current) use of aspirin: Secondary | ICD-10-CM | POA: Diagnosis not present

## 2016-03-04 DIAGNOSIS — Z7984 Long term (current) use of oral hypoglycemic drugs: Secondary | ICD-10-CM | POA: Insufficient documentation

## 2016-03-04 DIAGNOSIS — R739 Hyperglycemia, unspecified: Secondary | ICD-10-CM

## 2016-03-04 DIAGNOSIS — R350 Frequency of micturition: Secondary | ICD-10-CM | POA: Diagnosis not present

## 2016-03-04 DIAGNOSIS — M791 Myalgia, unspecified site: Secondary | ICD-10-CM

## 2016-03-04 DIAGNOSIS — I10 Essential (primary) hypertension: Secondary | ICD-10-CM | POA: Insufficient documentation

## 2016-03-04 LAB — URINE MICROSCOPIC-ADD ON

## 2016-03-04 LAB — CBC WITH DIFFERENTIAL/PLATELET
BASOS PCT: 0 %
Basophils Absolute: 0 10*3/uL (ref 0.0–0.1)
EOS ABS: 0.1 10*3/uL (ref 0.0–0.7)
EOS PCT: 1 %
HCT: 39.7 % (ref 36.0–46.0)
HEMOGLOBIN: 13.5 g/dL (ref 12.0–15.0)
Lymphocytes Relative: 47 %
Lymphs Abs: 2.6 10*3/uL (ref 0.7–4.0)
MCH: 29.9 pg (ref 26.0–34.0)
MCHC: 34 g/dL (ref 30.0–36.0)
MCV: 87.8 fL (ref 78.0–100.0)
MONOS PCT: 6 %
Monocytes Absolute: 0.4 10*3/uL (ref 0.1–1.0)
NEUTROS PCT: 46 %
Neutro Abs: 2.7 10*3/uL (ref 1.7–7.7)
PLATELETS: 195 10*3/uL (ref 150–400)
RBC: 4.52 MIL/uL (ref 3.87–5.11)
RDW: 12.6 % (ref 11.5–15.5)
WBC: 5.8 10*3/uL (ref 4.0–10.5)

## 2016-03-04 LAB — CBG MONITORING, ED: GLUCOSE-CAPILLARY: 252 mg/dL — AB (ref 65–99)

## 2016-03-04 LAB — BASIC METABOLIC PANEL
Anion gap: 9 (ref 5–15)
BUN: 10 mg/dL (ref 6–20)
CHLORIDE: 105 mmol/L (ref 101–111)
CO2: 23 mmol/L (ref 22–32)
CREATININE: 0.67 mg/dL (ref 0.44–1.00)
Calcium: 9.6 mg/dL (ref 8.9–10.3)
Glucose, Bld: 278 mg/dL — ABNORMAL HIGH (ref 65–99)
Potassium: 3.8 mmol/L (ref 3.5–5.1)
SODIUM: 137 mmol/L (ref 135–145)

## 2016-03-04 LAB — URINALYSIS, ROUTINE W REFLEX MICROSCOPIC
BILIRUBIN URINE: NEGATIVE
Glucose, UA: >1000 mg/dL — AB
HGB URINE DIPSTICK: NEGATIVE
KETONES UR: NEGATIVE mg/dL
Leukocytes, UA: NEGATIVE
NITRITE: NEGATIVE
PROTEIN: NEGATIVE mg/dL
SPECIFIC GRAVITY, URINE: 1.03 (ref 1.005–1.030)
pH: 5 (ref 5.0–8.0)

## 2016-03-04 LAB — I-STAT BETA HCG BLOOD, ED (MC, WL, AP ONLY): I-stat hCG, quantitative: <5 m[IU]/mL (ref <5)

## 2016-03-04 LAB — MAGNESIUM: MAGNESIUM: 1.8 mg/dL (ref 1.7–2.4)

## 2016-03-04 MED ORDER — SODIUM CHLORIDE 0.9 % IV BOLUS (SEPSIS)
1000.0000 mL | Freq: Once | INTRAVENOUS | Status: AC
  Administered 2016-03-04: 1000 mL via INTRAVENOUS

## 2016-03-04 NOTE — Discharge Instructions (Signed)
Please follow with your primary care doctor in the next 2 days for a check-up. They must obtain records for further management.   Do not hesitate to return to the Emergency Department for any new, worsening or concerning symptoms.    Blood Glucose Monitoring, Adult Monitoring your blood glucose (also know as blood sugar) helps you to manage your diabetes. It also helps you and your health care provider monitor your diabetes and determine how well your treatment plan is working. WHY SHOULD YOU MONITOR YOUR BLOOD GLUCOSE?  It can help you understand how food, exercise, and medicine affect your blood glucose.  It allows you to know what your blood glucose is at any given moment. You can quickly tell if you are having low blood glucose (hypoglycemia) or high blood glucose (hyperglycemia).  It can help you and your health care provider know how to adjust your medicines.  It can help you understand how to manage an illness or adjust medicine for exercise. WHEN SHOULD YOU TEST? Your health care provider will help you decide how often you should check your blood glucose. This may depend on the type of diabetes you have, your diabetes control, or the types of medicines you are taking. Be sure to write down all of your blood glucose readings so that this information can be reviewed with your health care provider. See below for examples of testing times that your health care provider may suggest. Type 1 Diabetes  Test at least 2 times per day if your diabetes is well controlled, if you are using an insulin pump, or if you perform multiple daily injections.  If your diabetes is not well controlled or if you are sick, you may need to test more often.  It is a good idea to also test:  Before every insulin injection.  Before and after exercise.  Between meals and 2 hours after a meal.  Occasionally between 2:00 a.m. and 3:00 a.m. Type 2 Diabetes  If you are taking insulin, test at least 2 times  per day. However, it is best to test before every insulin injection.  If you take medicines by mouth (orally), test 2 times a day.  If you are on a controlled diet, test once a day.  If your diabetes is not well controlled or if you are sick, you may need to monitor more often. HOW TO MONITOR YOUR BLOOD GLUCOSE Supplies Needed  Blood glucose meter.  Test strips for your meter. Each meter has its own strips. You must use the strips that go with your own meter.  A pricking needle (lancet).  A device that holds the lancet (lancing device).  A journal or log book to write down your results. Procedure  Wash your hands with soap and water. Alcohol is not preferred.  Prick the side of your finger (not the tip) with the lancet.  Gently milk the finger until a small drop of blood appears.  Follow the instructions that come with your meter for inserting the test strip, applying blood to the strip, and using your blood glucose meter. Other Areas to Get Blood for Testing Some meters allow you to use other areas of your body (other than your finger) to test your blood. These areas are called alternative sites. The most common alternative sites are:  The forearm.  The thigh.  The back area of the lower leg.  The palm of the hand. The blood flow in these areas is slower. Therefore, the blood glucose values  you get may be delayed, and the numbers are different from what you would get from your fingers. Do not use alternative sites if you think you are having hypoglycemia. Your reading will not be accurate. Always use a finger if you are having hypoglycemia. Also, if you cannot feel your lows (hypoglycemia unawareness), always use your fingers for your blood glucose checks. ADDITIONAL TIPS FOR GLUCOSE MONITORING  Do not reuse lancets.  Always carry your supplies with you.  All blood glucose meters have a 24-hour "hotline" number to call if you have questions or need help.  Adjust  (calibrate) your blood glucose meter with a control solution after finishing a few boxes of strips. BLOOD GLUCOSE RECORD KEEPING It is a good idea to keep a daily record or log of your blood glucose readings. Most glucose meters, if not all, keep your glucose records stored in the meter. Some meters come with the ability to download your records to your home computer. Keeping a record of your blood glucose readings is especially helpful if you are wanting to look for patterns. Make notes to go along with the blood glucose readings because you might forget what happened at that exact time. Keeping good records helps you and your health care provider to work together to achieve good diabetes management.    This information is not intended to replace advice given to you by your health care provider. Make sure you discuss any questions you have with your health care provider.   Document Released: 09/13/2003 Document Revised: 10/01/2014 Document Reviewed: 02/02/2013 Elsevier Interactive Patient Education [employer].

## 2016-03-04 NOTE — ED Provider Notes (Signed)
CSN: [id number]     Arrival date & time 03/04/16  1107 History   First MD Initiated Contact with Patient 03/04/16 1116     Chief Complaint  Patient presents with  . Hyperglycemia  . Urinary Frequency     (Consider location/radiation/quality/duration/timing/severity/associated sxs/prior Treatment) HPI    Blood pressure 127/84, pulse 91, temperature 98.8 F (37.1 C), temperature source Oral, resp. rate 18, SpO2 98 %.  Lisa Cole is a 35 y.o. female with past medical history significant for insulin-dependent diabetes, IBS, Hashimoto's thyroiditis advised to present to the ED by on-call nurse for endocrinology, complaining of generalized fatigue, bilateral lower extremity muscle aches, urinary frequency with polydipsia worsening over the course of 3-5 days, she's also noticed a slice 3 smell to her urine and breath. She's been compliant with her insulin but she ran out of her vitamin D prescription 5 days ago.  She denies fevers, chills endorses productive cough without chest pain or shortness of breath however she does have palpitations. She denies dysuria, hematuria but endorses urination with particulate, denies any abnormal vaginal discharge. She also has diffuse fleeting crampy abdominal pain with left flank and left lower back discomfort.  Past Medical History  Diagnosis Date  . Endometriosis   . Type 2 diabetes mellitus ([place])   . Anemia   . BV (bacterial vaginosis)   . Hepatic steatosis   . Arthritis   . Asthma   . IBS (irritable bowel syndrome)   . Obesity   . Chronic pelvic pain in female   . Hashimoto thyroiditis   . OSA (obstructive sleep apnea)   . Anxiety disorder   . SVT (supraventricular tachycardia) ([place])   . Diabetes mellitus   . IC (interstitial cystitis)   . Hypertension    Past Surgical History  Procedure Laterality Date  . Total abdominal hysterectomy  2004  . Tubal ligation  2001  . Shoulder surgery  2008    right  . Wrist surgery  2008    right   . Cystostomy w/ bladder biopsy    . Bladder staple     Family History  Problem Relation Age of Onset  . Alcohol abuse    . Arthritis Maternal Grandmother   . Diabetes Daughter     type 2  . Hypertension    . Hyperlipidemia    . Kidney disease    . Colon cancer      uncle  . Prostate cancer      uncles and grandfather  . Cirrhosis      both grandmother and grandfather  . Lung cancer    . Heart disease    . Thyroid disease    . Colon polyps    . Liver disease    . Hypertension Father    Social History  Substance Use Topics  . Smoking status: Never Smoker   . Smokeless tobacco: Never Used  . Alcohol Use: No   OB History    Gravida Para Term Preterm AB TAB SAB Ectopic Multiple Living   4 3 3  0 1 0 1 0 0 3     Review of Systems  10 systems reviewed and found to be negative, except as noted in the HPI.   Allergies  Liraglutide; Codeine; Acetaminophen; Ibuprofen; Amoxicillin; Metformin; and Penicillins  Home Medications   Prior to Admission medications   Medication Sig Start Date End Date Taking? Authorizing Provider  albuterol (PROVENTIL HFA;VENTOLIN HFA) 108 (90 Base) MCG/ACT inhaler Inhale 2 puffs into  the lungs every 4 (four) hours as needed for wheezing or shortness of breath.    Yes Historical Provider, MD  aspirin EC 81 MG tablet Take 81 mg by mouth daily.   Yes Historical Provider, MD  budesonide-formoterol (SYMBICORT) 160-4.5 MCG/ACT inhaler Inhale 2 puffs into the lungs 2 (two) times daily. 01/25/16  Yes [place], MD  glipiZIDE (GLUCOTROL) 5 MG tablet Take 1 tablet (5 mg total) by mouth 2 (two) times daily before a meal. 11/29/15  Yes [name], MD  Insulin Glargine (LANTUS) 100 UNIT/ML Solostar Pen Inject 40 Units into the skin at bedtime. Patient taking differently: Inject 60 Units into the skin at bedtime.  12/29/15 03/04/16 Yes [name], MD  omeprazole (PRILOSEC) 20 MG capsule Take 1 capsule by mouth daily. 12/08/15  Yes Historical  Provider, MD  Cholecalciferol (D3-50) 50000 units capsule Take 1 tablet by mouth once a week. Reported on 03/04/2016 01/16/16 01/15/17  Historical Provider, MD  lisinopril (PRINIVIL,ZESTRIL) 5 MG tablet Take 5 mg by mouth daily. Reported on 03/04/2016 10/27/15   Historical Provider, MD   BP 127/84 mmHg  Pulse 91  Temp(Src) 98.8 F (37.1 C) (Oral)  Resp 18  SpO2 98% Physical Exam  Constitutional: She is oriented to person, place, and time. She appears well-developed and well-nourished. No distress.  Central adiposity  HENT:  Head: Normocephalic.  Mouth/Throat: Oropharynx is clear and moist.  Bilateral proptosis  Eyes: Conjunctivae and EOM are normal. Pupils are equal, round, and reactive to light.  Neck: Normal range of motion. Neck supple.  Cardiovascular: Normal rate, regular rhythm and intact distal pulses.   Pulmonary/Chest: Effort normal and breath sounds normal. No stridor. No respiratory distress. She has no wheezes. She has no rales. She exhibits no tenderness.  Abdominal: Soft. Bowel sounds are normal. She exhibits no distension and no mass. There is tenderness. There is no rebound and no guarding.  Mild, diffuse tenderness to palpation with no guarding or rebound.  Murphy sign negative, no tenderness to palpation over McBurney's point, Rovsings, Psoas and obturator all negative.   Genitourinary:  No CVA tenderness to percussion bilaterally  Musculoskeletal: Normal range of motion.  Neurological: She is alert and oriented to person, place, and time.  Psychiatric: She has a normal mood and affect.  Nursing note and vitals reviewed.   ED Course  Procedures (including critical care time) Labs Review Labs Reviewed  BASIC METABOLIC PANEL - Abnormal; Notable for the following:    Glucose, Bld 278 (*)    All other components within normal limits  URINALYSIS, ROUTINE W REFLEX MICROSCOPIC (NOT AT [facility]) - Abnormal; Notable for the following:    Glucose, UA >1000 (*)    All other  components within normal limits  URINE MICROSCOPIC-ADD ON - Abnormal; Notable for the following:    Squamous Epithelial / LPF 0-5 (*)    Bacteria, UA RARE (*)    Casts HYALINE CASTS (*)    All other components within normal limits  CBG MONITORING, ED - Abnormal; Notable for the following:    Glucose-Capillary 252 (*)    All other components within normal limits  CBC WITH DIFFERENTIAL/PLATELET  MAGNESIUM  I-STAT BETA HCG BLOOD, ED ([facility], [facility], [facility] ONLY)    Imaging Review Dg Chest 2 View  03/04/2016  CLINICAL DATA:  Hyperglycemia.  History of asthma. EXAM: CHEST  2 VIEW COMPARISON:  07/19/2015. FINDINGS: The heart size and mediastinal contours are within normal limits. Both lungs are clear. The visualized  skeletal structures are unremarkable. IMPRESSION: Normal examination. Electronically Signed   By: Claudie Revering M.D.   On: 03/04/2016 12:00   I have personally reviewed and evaluated these images and lab results as part of my medical decision-making.   EKG Interpretation None      MDM   Final diagnoses:  Hyperglycemia without ketosis  Urinary frequency  Myalgia    Filed Vitals:   03/04/16 1122  BP: 127/84  Pulse: 91  Temp: 98.8 F (37.1 C)  TempSrc: Oral  Resp: 18  SpO2: 98%    Medications  sodium chloride 0.9 % bolus 1,000 mL (0 mLs Intravenous Stopped 03/04/16 1310)    Lisa Cole is 35 y.o. female presenting with Urinary frequency, polyuria, left flank pain with leg cramping worsening over the course of a week, she ran out of her Januvia approximately 1 week ago. States she's been compliant with her insulin at home. Very mild elevation in blood glucose at 252, she has a normal anion gap. Patient has been reporting cough, chest x-rays without infiltrate, she is afebrile, no tachypnea or tachycardia, lung sounds are clear to auscultation. Urinalysis is without signs of infection. No significant electrolyte abnormality to explain this patient's lower extremity muscle  aches. Patient states that she has a refill on her Januvia, she is a primary care appointment next week which have encouraged her to compliant with.  Evaluation does not show pathology that would require ongoing emergent intervention or inpatient treatment. Pt is hemodynamically stable and mentating appropriately. Discussed findings and plan with patient/guardian, who agrees with care plan. All questions answered. Return precautions discussed and outpatient follow up given.    Monico Blitz, PA-C 03/04/16 [place], DO 03/07/16 2258

## 2016-03-04 NOTE — ED Notes (Signed)
Patient transported to X-ray 

## 2016-03-04 NOTE — ED Notes (Addendum)
Pt presents with c/o hyperglycemia (291 at home this morning), frequent urination with a fruity smell, and leg cramps that started this morning. Pt does report a hx of diabetes. Pt reports she initially noticed the symptoms approx 5 days ago.

## 2016-03-05 ENCOUNTER — Telehealth: Payer: Self-pay | Admitting: Internal Medicine

## 2016-03-05 NOTE — Telephone Encounter (Signed)
See note below and please advise during Dr. Gherghe's absence, Thanks! 

## 2016-03-05 NOTE — Telephone Encounter (Signed)
Patient stated that her urine smells sweet since week, her b/s stay 300 + extremely thirsty, dry mouth, leg cramps, she takes Lantus but can't remember the last time she took. it please advise

## 2016-03-05 NOTE — Telephone Encounter (Signed)
Please verify lantus is 40/d Then increase to 50/d

## 2016-03-05 NOTE — Telephone Encounter (Signed)
I contacted the pt and advised of note below. Requested a call back if the pt would like to discuss.

## 2016-03-09 DIAGNOSIS — Z6835 Body mass index (BMI) 35.0-35.9, adult: Secondary | ICD-10-CM | POA: Diagnosis not present

## 2016-03-09 DIAGNOSIS — J209 Acute bronchitis, unspecified: Secondary | ICD-10-CM | POA: Diagnosis not present

## 2016-03-09 DIAGNOSIS — I10 Essential (primary) hypertension: Secondary | ICD-10-CM | POA: Diagnosis not present

## 2016-03-09 DIAGNOSIS — E1165 Type 2 diabetes mellitus with hyperglycemia: Secondary | ICD-10-CM | POA: Diagnosis not present

## 2016-03-09 DIAGNOSIS — Z794 Long term (current) use of insulin: Secondary | ICD-10-CM | POA: Diagnosis not present

## 2016-03-29 ENCOUNTER — Ambulatory Visit: Payer: Medicare Other | Admitting: Internal Medicine

## 2016-04-02 ENCOUNTER — Telehealth: Payer: Self-pay | Admitting: Pulmonary Disease

## 2016-04-02 NOTE — Telephone Encounter (Signed)
Called spoke with pt. She reports she still has not been set up on the CPAP machine yet. Her phone line then d/c'd. When called patient back I received VM x 2. LMTCB

## 2016-04-03 ENCOUNTER — Ambulatory Visit: Payer: Medicare Other | Admitting: Pulmonary Disease

## 2016-04-03 NOTE — Telephone Encounter (Signed)
Spoke with pt. States that she has heard about her CPAP machine we ordered back in May. Order was placed with [facility]. Advised pt that I would call [facility] and inquire about this.  I have left a message with Melissa at [facility] to look into this. Will await Melissa's call back.

## 2016-04-03 NOTE — Telephone Encounter (Signed)
Melissa with [facility] called back and said the patient originally could not afford the CPAP but Melissa will reach back out to the patient.

## 2016-04-04 ENCOUNTER — Telehealth: Payer: Self-pay | Admitting: Pulmonary Disease

## 2016-04-04 DIAGNOSIS — G4733 Obstructive sleep apnea (adult) (pediatric): Secondary | ICD-10-CM

## 2016-04-04 NOTE — Telephone Encounter (Signed)
Spoke with pt. She states that this was not the case. The current machine that she has would not hold the settings that AD wanted her to have. [facility] was supposed to call us about this matter but we have not documentation about this. Pt became very argumentative and demanded to have Melissa's number. I did not give this information to the pt. Advised her that if she had an issue and needed to speak with [facility], she could call their main number. Nothing further was needed at this time.

## 2016-04-04 NOTE — Telephone Encounter (Signed)
Called spoke with Melissa. She reports everything is good to go with pt. Nothing further needed

## 2016-04-04 NOTE — Telephone Encounter (Signed)
Called spoke with pt. She reports her CPAP machine is not capabale of auto setting. She is needing [place] to send a new order for a new CPAP machine. I have placed a new order. Nothing further needed

## 2016-04-06 ENCOUNTER — Ambulatory Visit: Payer: Medicare Other | Admitting: Dietician

## 2016-04-24 ENCOUNTER — Ambulatory Visit: Payer: Medicare Other | Admitting: Internal Medicine

## 2016-05-31 ENCOUNTER — Other Ambulatory Visit: Payer: Self-pay | Admitting: Internal Medicine

## 2016-07-13 ENCOUNTER — Ambulatory Visit: Payer: Medicare Other | Admitting: Internal Medicine

## 2016-09-05 ENCOUNTER — Ambulatory Visit (INDEPENDENT_AMBULATORY_CARE_PROVIDER_SITE_OTHER): Payer: Medicare Other | Admitting: Internal Medicine

## 2016-09-05 ENCOUNTER — Encounter: Payer: Self-pay | Admitting: Internal Medicine

## 2016-09-05 VITALS — BP 104/70 | HR 92 | Ht 60.0 in | Wt 170.2 lb

## 2016-09-05 DIAGNOSIS — E1165 Type 2 diabetes mellitus with hyperglycemia: Secondary | ICD-10-CM

## 2016-09-05 DIAGNOSIS — Z8639 Personal history of other endocrine, nutritional and metabolic disease: Secondary | ICD-10-CM

## 2016-09-05 DIAGNOSIS — Z23 Encounter for immunization: Secondary | ICD-10-CM

## 2016-09-05 DIAGNOSIS — Z794 Long term (current) use of insulin: Secondary | ICD-10-CM

## 2016-09-05 LAB — POCT GLYCOSYLATED HEMOGLOBIN (HGB A1C): Hemoglobin A1C: 12.5

## 2016-09-05 MED ORDER — INSULIN ASPART 100 UNIT/ML FLEXPEN: 10.0000 [IU] | PEN_INJECTOR | Freq: Three times a day (TID) | SUBCUTANEOUS | 3 refills | Status: DC

## 2016-09-05 NOTE — Patient Instructions (Addendum)
Please continue: - Lantus 40 units at bedtime.   Please add: - Novolog: 10 units before a smaller meal 15 units before a larger meal  Please return in 1.5 months with your sugar log.

## 2016-09-05 NOTE — Progress Notes (Signed)
Patient ID: Lisa Cole, female   DOB: Jun 02, 1981, 35 y.o.   MRN: [id number]  HPI: Lisa Cole is a 35 y.o.-year-old female, referred by Dr. Zigmund Daniel for management of DM2, dx as GDM (at 35 y/o), then DM2  since 2000 insulin-dependent, uncontrolled, with complications: PN). She was previously seeing Dr. Meredith Pel and Dr. Chalmers Cater. PCP: Dr Jearld Pies + [facility]  Last visit 9 months ago. She did not return in 1.5 months as advised. She is late 14 minutes before her 15 min appointment.  Since last visit, she was in the emergency room with hyperglycemia but without DKA in 02/2016.  Stopped drinking regular sodas after last visit. Drinks water. Diet is still poor, she tells me that sometimes eats once a day. Still going to [employer] every day.   Last hemoglobin A1c was: Lab Results  Component Value Date   HGBA1C 11.3 11/29/2015   HGBA1C 11.3 09/07/2015   HGBA1C 6.8 (H) 12/07/2010  11/03/2015: HbA1c 12.2% 06/03/2014: HbA1c 8.5% 11/23/2013: HbA1c 7.9%  Pt is on a regimen of: - Lantus 10 >> 30  >> 40-50 units at bedtime - may forget 1-2x a week; mentions occasional cough and wheezing She did not start Glipizide 5 mg 2x a day  - mentions is too expensive She stopped Januvia 100 mg daily in am She stopped Invokana (started by PCP) - mm spasms She was previously on: - Metformin IR and ER >> N/V/D/"spacing out" - Victoza >> dysphagia and rash - Actos >> no pbs  Pt checks her sugars 1-2x a day: - am: 165, 224-315 >> 225-347 - 2h after b'fast: 259, 304 >> n/c - before lunch: 201-301 >> 285 - 2h after lunch: n/c >> 348 - before dinner: n/c - 2h after dinner: 315, 328 >> n/c - bedtime: n/c - nighttime: n/c No lows. Lowest sugar was 165 >> 280; she has hypoglycemia awareness at 70.  Highest sugar was 300s >> 410.  Glucometer: AccuChek Aviva  Pt's meals are: - Breakfast:  salad  + chicken nuggets - [employer] - Lunch: burger + fries + tea or fruit punch or regular Coke or  skips - Dinner: fast food - Snacks: no She saw Antonieta Iba with nutrition in the past.  Exercises 3x a week.  - no CKD, last BUN/creatinine:  11/03/2015: 9/0.7, Glu 315 Lab Results  Component Value Date   BUN 10 03/04/2016   CREATININE 0.67 03/04/2016  11/03/2015: ACR 4.1 09/07/2015: ACR 15 06/03/2014: ACR <2.2 Not on an ACEI/ARB. - last set of lipids: 11/03/2015: 139/169/33/72 09/07/2015: 143/143/36/78 Lab Results  Component Value Date   CHOL 111 02/16/2010   HDL 35.50 (L) 02/16/2010   LDLCALC 56 02/16/2010   TRIG 100.0 02/16/2010   CHOLHDL 3 02/16/2010  Not on a statin. - last eye exam was in 10/08/2014. No DR.  - + numbness and tingling in her feet.  She also has a history of thyroid nodules. I reviewed the thyroid ultrasound available in Epic, from 03/24/2014 , and there were no worrisome thyroid nodules , only a small stable 3 mm nodule in the right lobe. She apparently had another ultrasound in 11/2014, however this is not available to me.   Reviewed her latest thyroid tests:  11/03/2015: TSH 0.89 11/03/2015: TSH 0.89, free T4  0.85 (0.6-1.5)  ROS: Constitutional: + weight loss, + fatigue, no subjective hyperthermia/hypothermia, + nocturia Eyes:+ blurry vision, no xerophthalmia ENT: no sore throat, no nodules palpated in throat, + dysphagia/no odynophagia, no hoarseness Cardiovascular: +  CP/no SOB/+ palpitations/no leg swelling Respiratory: no cough/SOB/+ wheezing Gastrointestinal: no heartburn/N/V/D/C Musculoskeletal: + muscle/+ joint aches Skin: no rashes Neurological: + tremors/numbness/tingling/dizziness, + HA  I reviewed pt's medications, allergies, PMH, social hx, family hx, and changes were documented in the history of present illness. Otherwise, unchanged from my initial visit note.  Past Medical History:  Diagnosis Date  . Anemia   . Anxiety disorder   . Arthritis   . Asthma   . BV (bacterial vaginosis)   . Chronic pelvic pain in female   .  Diabetes mellitus   . Endometriosis   . Hashimoto thyroiditis   . Hepatic steatosis   . Hypertension   . IBS (irritable bowel syndrome)   . IC (interstitial cystitis)   . Obesity   . OSA (obstructive sleep apnea)   . SVT (supraventricular tachycardia) ([place])   . Type 2 diabetes mellitus ([place])    Past Surgical History:  Procedure Laterality Date  . bladder staple    . CYSTOSTOMY W/ BLADDER BIOPSY    . SHOULDER SURGERY  2008   right  . TOTAL ABDOMINAL HYSTERECTOMY  2004  . TUBAL LIGATION  2001  . WRIST SURGERY  2008   right   Social History   Social History  . Marital Status: Divorced    Spouse Name: N/A  . Number of Children: 3   Occupational History  .  retirement    Social History Main Topics  . Smoking status: Never Smoker   . Smokeless tobacco: Never Used  . Alcohol Use: No  . Drug Use: No   Social History Narrative   Exercise: yes, working out w/ Physiological scientist   Diet: incorporated more fruits and vegetables    Current Outpatient Prescriptions on File Prior to Visit  Medication Sig Dispense Refill  . albuterol (PROVENTIL HFA;VENTOLIN HFA) 108 (90 Base) MCG/ACT inhaler Inhale 2 puffs into the lungs every 4 (four) hours as needed for wheezing or shortness of breath.     Marland Kitchen aspirin EC 81 MG tablet Take 81 mg by mouth daily.    . budesonide-formoterol (SYMBICORT) 160-4.5 MCG/ACT inhaler Inhale 2 puffs into the lungs 2 (two) times daily. 1 Inhaler 6  . Cholecalciferol (D3-50) 50000 units capsule Take 1 tablet by mouth once a week. Reported on 03/04/2016    . glipiZIDE (GLUCOTROL) 5 MG tablet Take 1 tablet (5 mg total) by mouth 2 (two) times daily before a meal. 60 tablet 2  . LANTUS SOLOSTAR 100 UNIT/ML Solostar Pen inject 40 units subcutaneously at bedtime 15 mL 0  . lisinopril (PRINIVIL,ZESTRIL) 5 MG tablet Take 5 mg by mouth daily. Reported on 03/04/2016  0  . omeprazole (PRILOSEC) 20 MG capsule Take 1 capsule by mouth daily.     No current facility-administered  medications on file prior to visit.    Allergies  Allergen Reactions  . Liraglutide Hives, Itching and Other (See Comments)    "Burning sensation" "Burning sensation"  . Codeine Nausea Only  . Acetaminophen Other (See Comments)    Advised not to take due to peptic ulcer  . Ibuprofen Other (See Comments)    Advised not to take due to peptic ulcer  . Amoxicillin Swelling and Rash    Has patient had a PCN reaction causing immediate rash, facial/tongue/throat swelling, SOB or lightheadedness with hypotension:yes Has patient had a PCN reaction causing severe rash involving mucus membranes or skin necrosis: no Has patient had a PCN reaction that required hospitalization: yes Has patient had a  PCN reaction occurring within the last 10 years: no If all of the above answers are "NO", then may proceed with Cephalosporin use.   . Metformin Diarrhea    "GI upset even with Metformin XR"  . Penicillins Swelling and Rash    Has patient had a PCN reaction causing immediate rash, facial/tongue/throat swelling, SOB or lightheadedness with hypotension: Yes Has patient had a PCN reaction causing severe rash involving mucus membranes or skin necrosis: No Has patient had a PCN reaction that required hospitalization Yes Has patient had a PCN reaction occurring within the last 10 years: No If all of the above answers are "NO", then may proceed with Cephalosporin use.    Family History  Problem Relation Age of Onset  . Alcohol abuse    . Arthritis Maternal Grandmother   . Diabetes Daughter     type 2  . Hypertension    . Hyperlipidemia    . Kidney disease    . Colon cancer      uncle  . Prostate cancer      uncles and grandfather  . Cirrhosis      both grandmother and grandfather  . Lung cancer    . Heart disease    . Thyroid disease    . Colon polyps    . Liver disease    . Hypertension Father    PE: BP 104/70 (BP Location: Right Arm, Patient Position: Sitting, Cuff Size: Large)   Pulse  92   Ht 5' (1.524 m)   Wt 170 lb 3.2 oz (77.2 kg)   SpO2 98%   BMI 33.24 kg/m  Body mass index is 33.24 kg/m.  Wt Readings from Last 3 Encounters:  09/05/16 170 lb 3.2 oz (77.2 kg)  01/29/16 182 lb (82.6 kg)  01/25/16 182 lb (82.6 kg)   Constitutional:  obese -  Central  distribution, in NAD Eyes: PERRLA, EOMI, no exophthalmos ENT: moist mucous membranes, no thyromegaly, no cervical lymphadenopathy Cardiovascular: RRR, No MRG Respiratory: CTA B Gastrointestinal: abdomen soft, NT, ND, BS+ Musculoskeletal: no deformities, strength intact in all 4 Skin: moist, warm, no rashes Neurological: no tremor with outstretched hands, DTR normal in all 4  ASSESSMENT: 1. DM2, insulin-dependent, uncontrolled, without complications  2.  History of thyroid nodules  PLAN:  1. Patient with long-standing, uncontrolled diabetes, returning after an long absence, not taking the medicines prescribed at last visit. She also did not let me know about the fact that she could not afford Januvia. She tells me that glipizide was also not affordable, despite the fact that this is on the $4 [facility] at [facility]. She continues to have a very poor diet consisting mostly of fast food, despite a lengthy discussion at last visit about the importance of improving her diet and the possible consequences of her continuing what she is doing now. She came off sodas. - For now, as sugars are very high, will add mealtime insulin. - HbA1c today is still very high: 12.5% - I suggested to:  Patient Instructions  Please continue: - Lantus 40 units at bedtime.   Please add: - Novolog: 10 units before a smaller meal 15 units before a larger meal  Please return in 1.5 months with your sugar log.   - check sugars at different times of the day - check 2 times a day, rotating checks - advised for yearly eye exams >>  Needs a new eye exam - Return to clinic in 1.5 mo with sugar log  2.  History of thyroid nodules -  Reviewed  the ultrasound report available in Epic: only a small 3 mm thyroid nodule.  She had another U/S in 2016 >> will bring the CD. I would not recommend continuing the ultrasounds for now. -  latest TFTs from 11/03/2015, which were normal -  No neck compression symptoms  [name], MD PhD [facility] Endocrinology

## 2016-09-06 ENCOUNTER — Telehealth: Payer: Self-pay | Admitting: Internal Medicine

## 2016-09-06 ENCOUNTER — Other Ambulatory Visit: Payer: Self-pay | Admitting: Endocrinology

## 2016-09-06 NOTE — Telephone Encounter (Signed)
Pt called and said that in order for her to get her Novolog that there is a form that needs to be filled out before they can fill it.  Also that she needs her Lantus and test strips sent to the Pharmacy as well.

## 2016-09-10 DIAGNOSIS — Z90710 Acquired absence of both cervix and uterus: Secondary | ICD-10-CM | POA: Diagnosis not present

## 2016-09-10 DIAGNOSIS — Z6833 Body mass index (BMI) 33.0-33.9, adult: Secondary | ICD-10-CM | POA: Diagnosis not present

## 2016-09-10 DIAGNOSIS — K5900 Constipation, unspecified: Secondary | ICD-10-CM | POA: Diagnosis not present

## 2016-09-10 DIAGNOSIS — Z1272 Encounter for screening for malignant neoplasm of vagina: Secondary | ICD-10-CM | POA: Diagnosis not present

## 2016-09-10 DIAGNOSIS — Z124 Encounter for screening for malignant neoplasm of cervix: Secondary | ICD-10-CM | POA: Diagnosis not present

## 2016-09-12 NOTE — Telephone Encounter (Signed)
Noted.  Called patient. No answer.

## 2016-09-13 NOTE — Telephone Encounter (Signed)
DWO F form need to be filled out first before she can get her insulin. Please advise

## 2016-09-19 NOTE — Telephone Encounter (Signed)
See message, Do you know what forms the patient is needing?

## 2016-09-19 NOTE — Telephone Encounter (Signed)
Patient is call on the status of the form that waw sent to [facility].  please advise today

## 2016-09-20 NOTE — Telephone Encounter (Signed)
Can she clarify what type of form? Did she give it to me during the last appointment, in that case, I already filled it and gave it to my nurse that day.

## 2016-09-20 NOTE — Telephone Encounter (Signed)
I contacted the patient and advised I could not locate the paper work at this time. Patient advised if we found the paper work we would call her, but I requested her to resubmit the paper to attention [place].

## 2016-09-27 ENCOUNTER — Telehealth: Payer: Self-pay | Admitting: Internal Medicine

## 2016-09-28 ENCOUNTER — Other Ambulatory Visit: Payer: Self-pay

## 2016-09-28 MED ORDER — GLUCOSE BLOOD VI STRP: ORAL_STRIP | 3 refills | Status: DC

## 2016-09-28 MED ORDER — INSULIN ASPART 100 UNIT/ML FLEXPEN: 10.0000 [IU] | PEN_INJECTOR | Freq: Three times a day (TID) | SUBCUTANEOUS | 3 refills | Status: DC

## 2016-09-28 MED ORDER — ONETOUCH DELICA LANCING DEV MISC: 3 refills | Status: DC

## 2016-09-28 NOTE — Telephone Encounter (Signed)
Pt is in need of a new rx for Novolog, the one touch verio test strips, one touch delica lancets called to [facility] on [address].  Pt is aware of the previous issue that occurred with the paperwork and that it was being sent to the wrong office.  Please get this done asap thank you!

## 2016-09-28 NOTE — Telephone Encounter (Signed)
rxs sent

## 2016-10-25 ENCOUNTER — Ambulatory Visit: Payer: Medicare Other | Admitting: Internal Medicine

## 2016-12-20 ENCOUNTER — Ambulatory Visit: Payer: Self-pay | Admitting: Surgery

## 2016-12-20 NOTE — H&P (Signed)
History of Present Illness Lisa Cole. Lisa Fryberger MD; 12/20/2016 1:29 PM) The patient is a 36 year old female who presents for evaluation of gall stones. Referred by Vedia Coffer, PA-C for biliary dyskinesia  This is a 36 year old female with poorly controlled diabetes in obstructive sleep apnea who presents with a six-month history of intermittent right upper quadrant abdominal pain. This seems to be exacerbated by eating. These symptoms are accompanied by diarrhea, bloating, and nausea. The pain has become more frequent until now it seems almost constant. The patient has had previous laparoscopic surgeries as well as bladder surgeries for interstitial cystitis. She underwent an ultrasound in January that showed a small gallbladder polyp but no sign of stones or sludge. A recent HIDA scan showed a decreased gallbladder ejection fraction measured at 29%. All of her studies were performed in other medical systems.    Past Surgical History Malachy Moan, [place]; 12/20/2016 11:27 AM) Colon Polyp Removal - Colonoscopy Hysterectomy (not due to cancer) - Complete Oral Surgery Shoulder Surgery Right.  Diagnostic Studies History Malachy Moan, [place]; 12/20/2016 11:27 AM) Colonoscopy 1-5 years ago Mammogram 1-3 years ago Pap Smear 1-5 years ago  Allergies Malachy Moan, RMA; 12/20/2016 11:28 AM) Penicillins Anaphylaxis.  Medication History Malachy Moan, [place]; 12/20/2016 11:29 AM) [name] (In Vitro) Active. OneTouch Delica Lancets Fine Active. Vitamin D (Ergocalciferol) (50000UNIT Capsule, Oral) Active. Medications Reconciled  Social History Malachy Moan, [place]; 12/20/2016 11:27 AM) Caffeine use Carbonated beverages, Coffee, Tea. No alcohol use Tobacco use Never smoker.  Pregnancy / Birth History Malachy Moan, [place]; 12/20/2016 11:27 AM) Age at menarche 4 years. Age of menopause <44 Gravida 4 Irregular periods Maternal age 64-20 Para  3  Other Problems Malachy Moan, [place]; 12/20/2016 11:27 AM) Anxiety Disorder Arthritis Asthma Back Pain Bladder Problems Chest pain Depression Diabetes Mellitus Gastroesophageal Reflux Disease General anesthesia - complications Hemorrhoids Hypercholesterolemia Migraine Headache Other disease, cancer, significant illness Sleep Apnea Thyroid Disease     Review of Systems Malachy Moan RMA; 12/20/2016 11:27 AM) General Present- Appetite Loss, Fatigue, Weight Gain and Weight Loss. Not Present- Chills, Fever and Night Sweats. Skin Present- Dryness, Hives and Rash. Not Present- Change in Wart/Mole, Jaundice, New Lesions, Non-Healing Wounds and Ulcer. HEENT Present- Seasonal Allergies. Not Present- Earache, Hearing Loss, Hoarseness, Nose Bleed, Oral Ulcers, Ringing in the Ears, Sinus Pain, Sore Throat, Visual Disturbances, Wears glasses/contact lenses and Yellow Eyes. Respiratory Present- Snoring and Wheezing. Not Present- Bloody sputum, Chronic Cough and Difficulty Breathing. Breast Present- Breast Pain. Not Present- Breast Mass, Nipple Discharge and Skin Changes. Cardiovascular Present- Chest Pain, Palpitations and Rapid Heart Rate. Not Present- Difficulty Breathing Lying Down, Leg Cramps, Shortness of Breath and Swelling of Extremities. Gastrointestinal Present- Abdominal Pain, Bloating, Change in Bowel Habits, Constipation, Difficulty Swallowing, Excessive gas, Gets full quickly at meals, Hemorrhoids, Indigestion and Nausea. Not Present- Bloody Stool, Chronic diarrhea, Rectal Pain and Vomiting. Female Genitourinary Present- Frequency, Nocturia, Painful Urination, Pelvic Pain and Urgency. Musculoskeletal Present- Back Pain, Joint Pain and Joint Stiffness. Not Present- Muscle Pain, Muscle Weakness and Swelling of Extremities. Neurological Present- Decreased Memory, Numbness and Weakness. Not Present- Fainting, Headaches, Seizures, Tingling, Tremor and Trouble  walking. Psychiatric Present- Anxiety, Change in Sleep Pattern and Frequent crying. Not Present- Bipolar, Depression and Fearful. Endocrine Present- Hot flashes. Not Present- Cold Intolerance, Excessive Hunger, Hair Changes, Heat Intolerance and New Diabetes. Hematology Not Present- Blood Thinners, Easy Bruising, Excessive bleeding, Gland problems, HIV and Persistent Infections.  Vitals Malachy Moan RMA; 12/20/2016 11:29 AM) 12/20/2016 11:29 AM Weight:  167 lb Height: 60in Body Surface Area: 1.73 m Body Mass Index: 32.61 kg/m  Temp.: 98.79F  Pulse: 96 (Regular)  BP: 126/70 (Sitting, Left Arm, Standard)      Physical Exam Rodman Key K. Jusiah Aguayo MD; 12/20/2016 1:33 PM)  The physical exam findings are as follows: Note:WDWN in NAD Eyes: Pupils equal, round; sclera anicteric HENT: Oral mucosa moist; good dentition Neck: No masses palpated, no thyromegaly Lungs: CTA bilaterally; normal respiratory effort CV: Regular rate and rhythm; no murmurs; extremities well-perfused with no edema Abd: +bowel sounds, soft, moderately tender in epigastrium and RUQ, no palpable organomegaly; no palpable hernias Skin: Warm, dry; no sign of jaundice Psychiatric - alert and oriented x 4; calm mood and affect    Assessment & Plan Rodman Key K. Troyce Gieske MD; 12/20/2016 1:33 PM)  CHRONIC CHOLECYSTITIS (K81.1)  Current Plans Schedule for Surgery - Laparoscopic cholecystectomy with intraoperative cholangiogram. The surgical procedure has been discussed with the patient. Potential risks, benefits, alternative treatments, and expected outcomes have been explained. All of the patient's questions at this time have been answered. The likelihood of reaching the patient's treatment goal is good. The patient understand the proposed surgical procedure and wishes to proceed.  We'll plan to keep her overnight because of her brittle diabetes and her obstructive sleep apnea.  Lisa Cole. Georgette Dover, MD,  [facility] Surgery  General/ Trauma Surgery  12/20/2016 1:33 PM

## 2017-02-20 ENCOUNTER — Other Ambulatory Visit: Payer: Self-pay | Admitting: Internal Medicine

## 2017-02-20 NOTE — Telephone Encounter (Signed)
Okay to refill last seen 08/2016, no appointments made after reminder.  Thank you!

## 2017-02-20 NOTE — Telephone Encounter (Signed)
She did not return as advised. If she schedules a new appt >> we can refill. OTW, no refills.

## 2017-05-16 ENCOUNTER — Other Ambulatory Visit: Payer: Self-pay

## 2017-05-16 MED ORDER — INSULIN LISPRO 100 UNIT/ML (KWIKPEN): PEN_INJECTOR | SUBCUTANEOUS | 1 refills | Status: DC

## 2017-12-15 ENCOUNTER — Emergency Department (HOSPITAL_BASED_OUTPATIENT_CLINIC_OR_DEPARTMENT_OTHER)
Admission: EM | Admit: 2017-12-15 | Discharge: 2017-12-15 | Disposition: A | Discharge status: Home / Self Care | Payer: BC Managed Care – PPO | Attending: Emergency Medicine | Admitting: Emergency Medicine

## 2017-12-15 ENCOUNTER — Other Ambulatory Visit: Payer: Self-pay

## 2017-12-15 ENCOUNTER — Emergency Department (HOSPITAL_BASED_OUTPATIENT_CLINIC_OR_DEPARTMENT_OTHER): Payer: BC Managed Care – PPO

## 2017-12-15 ENCOUNTER — Encounter (HOSPITAL_BASED_OUTPATIENT_CLINIC_OR_DEPARTMENT_OTHER): Payer: Self-pay | Admitting: Emergency Medicine

## 2017-12-15 DIAGNOSIS — J45909 Unspecified asthma, uncomplicated: Secondary | ICD-10-CM | POA: Insufficient documentation

## 2017-12-15 DIAGNOSIS — E1165 Type 2 diabetes mellitus with hyperglycemia: Secondary | ICD-10-CM | POA: Insufficient documentation

## 2017-12-15 DIAGNOSIS — Z7982 Long term (current) use of aspirin: Secondary | ICD-10-CM | POA: Insufficient documentation

## 2017-12-15 DIAGNOSIS — Z794 Long term (current) use of insulin: Secondary | ICD-10-CM | POA: Insufficient documentation

## 2017-12-15 DIAGNOSIS — R079 Chest pain, unspecified: Secondary | ICD-10-CM | POA: Insufficient documentation

## 2017-12-15 LAB — BASIC METABOLIC PANEL
Anion gap: 10 (ref 5–15)
BUN: 9 mg/dL (ref 6–20)
CHLORIDE: 103 mmol/L (ref 101–111)
CO2: 24 mmol/L (ref 22–32)
Calcium: 9.8 mg/dL (ref 8.9–10.3)
Creatinine, Ser: 0.61 mg/dL (ref 0.44–1.00)
GFR calc Af Amer: >60 mL/min (ref >60)
GLUCOSE: 354 mg/dL — AB (ref 65–99)
POTASSIUM: 3.8 mmol/L (ref 3.5–5.1)
Sodium: 137 mmol/L (ref 135–145)

## 2017-12-15 LAB — CBC
HCT: 43.3 % (ref 36.0–46.0)
Hemoglobin: 15 g/dL (ref 12.0–15.0)
MCH: 30.1 pg (ref 26.0–34.0)
MCHC: 34.6 g/dL (ref 30.0–36.0)
MCV: 86.9 fL (ref 78.0–100.0)
PLATELETS: 218 10*3/uL (ref 150–400)
RBC: 4.98 MIL/uL (ref 3.87–5.11)
RDW: 11.9 % (ref 11.5–15.5)
WBC: 5.4 10*3/uL (ref 4.0–10.5)

## 2017-12-15 LAB — TROPONIN I: Troponin I: <0.03 ng/mL (ref <0.03)

## 2017-12-15 LAB — D-DIMER, QUANTITATIVE: D-Dimer, Quant: 0.44 ug/mL-FEU (ref 0.00–0.50)

## 2017-12-15 MED ORDER — GI COCKTAIL ~~LOC~~
30.0000 mL | Freq: Once | ORAL | Status: AC
  Administered 2017-12-15: 30 mL via ORAL
  Filled 2017-12-15: qty 30

## 2017-12-15 MED ORDER — KETOROLAC TROMETHAMINE 30 MG/ML IJ SOLN
15.0000 mg | Freq: Once | INTRAMUSCULAR | Status: AC
  Administered 2017-12-15: 15 mg via INTRAVENOUS
  Filled 2017-12-15: qty 1

## 2017-12-15 NOTE — ED Provider Notes (Signed)
[place] EMERGENCY DEPARTMENT Provider Note   CSN: [id number] Arrival date & time: 12/15/17  1119     History   Chief Complaint Chief Complaint  Patient presents with  . Chest Pain    HPI Lisa Cole is a 37 y.o. female.  HPI 37 year old African-American female with a past medical history significant for asthma, diabetes, obesity, sleep apnea, SVT that presents to the emergency department today for evaluation of chest discomfort.  Patient states for the past month she has been having some intermittent palpitations and chest pain.  Patient describes it as a soreness.  States that initially she will feel fluttering in her chest that is followed by a soreness for several days.  Patient states that several weeks ago she was treated for an upper respiratory tract infection with antibiotics.  Patient states that her symptoms have persisted.  States that she was having physical therapy yesterday when her physical therapist know that her blood pressure was in the 71I systolic.  The patient states she caught her primary care doctor told her to come to the ED for evaluation however she did not do so immediately.  Patient states that her palpitations become more frequent.  She also reports some chest soreness at this time.  Pain is substernal does not radiate.  No associated shortness of breath, diaphoresis, nausea or emesis.  Patient does report a history of SVT and tachycardia.  States that she was told to follow-up with cardiology in the past who want to do a heart catheterization however patient did not do this.  Patient denies a history of DVT/PE, prolonged immobilization, recent hospitalization/surgeries, unilateral leg swelling or calf tenderness, hemoptysis or tobacco use.  Patient not taking for her pain prior to arrival.  Nothing makes better or worse.  She does report some lightheadedness and dizziness with episodes of palpitations however denies any at this time.  Pt denies  any fever, chill, ha, vision changes,congestion, neck pain, sob, cough, abd pain, n/v/d, urinary symptoms, change in bowel habits, melena, hematochezia, lower extremity paresthesias.  Past Medical History:  Diagnosis Date  . Anemia   . Anxiety disorder   . Arthritis   . Asthma   . BV (bacterial vaginosis)   . Chronic pelvic pain in female   . Diabetes mellitus   . Endometriosis   . Hashimoto thyroiditis   . Hepatic steatosis   . Hypertension   . IBS (irritable bowel syndrome)   . IC (interstitial cystitis)   . Obesity   . OSA (obstructive sleep apnea)   . SVT (supraventricular tachycardia) ([place])   . Type 2 diabetes mellitus [facility])     Patient Active Problem List   Diagnosis Date Noted  . OSA (obstructive sleep apnea) 01/25/2016  . Asthma 01/25/2016  . GERD (gastroesophageal reflux disease) 01/25/2016  . H/O thyroid nodule 11/30/2015  . Type 2 diabetes mellitus with hyperglycemia, with long-term current use of insulin ([place]) 11/30/2015  . Cough 03/02/2011  . CONSTIPATION, CHRONIC 12/07/2010  . IRRITABLE BOWEL SYNDROME 12/07/2010  . OTHER DYSPHAGIA 12/07/2010  . ABDOMINAL PAIN OTHER SPECIFIED SITE 12/07/2010  . HEADACHE 07/17/2010  . HASHIMOTO'S THYROIDITIS 02/16/2010  . ADHD 02/16/2010  . FIBROCYSTIC BREAST DISEASE 03/11/2009  . OBESITY, UNSPECIFIED 12/27/2008  . FATTY LIVER DISEASE 07/20/2008  . IRON DEFICIENCY ANEMIA SECONDARY TO BLOOD LOSS 07/08/2008  . ENDOMETRIOSIS 07/08/2008    Past Surgical History:  Procedure Laterality Date  . bladder staple    . CYSTOSTOMY W/ BLADDER BIOPSY    .  SHOULDER SURGERY  2008   right  . TOTAL ABDOMINAL HYSTERECTOMY  2004  . TUBAL LIGATION  2001  . WRIST SURGERY  2008   right     OB History    Gravida  4   Para  3   Term  3   Preterm  0   AB  1   Living  3     SAB  1   TAB  0   Ectopic  0   Multiple  0   Live Births               Home Medications    Prior to Admission medications   Medication Sig  Start Date End Date Taking? Authorizing Provider  albuterol (PROVENTIL HFA;VENTOLIN HFA) 108 (90 Base) MCG/ACT inhaler Inhale 2 puffs into the lungs every 4 (four) hours as needed for wheezing or shortness of breath.    Yes [provider]  insulin aspart (NOVOLOG FLEXPEN) 100 UNIT/ML FlexPen Inject 10-15 Units into the skin 3 (three) times daily before meals. 10-15 min before meals 09/28/16  Yes [name], MD  LANTUS SOLOSTAR 100 UNIT/ML Solostar Pen inject 40 units subcutaneously at bedtime Patient taking differently: units40 subcutaneously at bedtime 09/06/16  Yes [name], MD  omeprazole (PRILOSEC) 20 MG capsule Take 1 capsule by mouth daily. 12/08/15  Yes [provider]  aspirin EC 81 MG tablet Take 81 mg by mouth daily.    [provider]  budesonide-formoterol (SYMBICORT) 160-4.5 MCG/ACT inhaler Inhale 2 puffs into the lungs 2 (two) times daily. Patient not taking: Reported on 09/05/2016 01/25/16   de Dios, [place], MD  glucose blood [facility] VERIO) test strip Use as instructed to check sugar 2 times daily. 09/28/16   [name], MD  insulin lispro (HUMALOG KWIKPEN) 100 UNIT/ML KiwkPen 10-15 units 3 times daily before meals. 05/16/17   [name], MD  Lancet Devices (ONE TOUCH DELICA LANCING DEV) MISC Use to check sugar 2 times daily. 09/28/16   [name], MD  lisinopril (PRINIVIL,ZESTRIL) 5 MG tablet Take 5 mg by mouth daily. Reported on 03/04/2016 10/27/15   [provider]    Family History Family History  Problem Relation Age of Onset  . Hypertension Father   . Alcohol abuse Unknown   . Hypertension Unknown   . Hyperlipidemia Unknown   . Kidney disease Unknown   . Colon cancer Unknown        uncle  . Prostate cancer Unknown        uncles and grandfather  . Cirrhosis Unknown        both grandmother and grandfather  . Lung cancer Unknown   . Heart disease Unknown   . Thyroid disease Unknown   . Colon polyps  Unknown   . Liver disease Unknown   . Arthritis Maternal Grandmother   . Diabetes Daughter        type 2    Social History Social History   Tobacco Use  . Smoking status: Never Smoker  . Smokeless tobacco: Never Used  Substance Use Topics  . Alcohol use: No  . Drug use: No     Allergies   Liraglutide; Codeine; Acetaminophen; Ibuprofen; Amoxicillin; Metformin; and Penicillins   Review of Systems Review of Systems  All other systems reviewed and are negative.    Physical Exam Updated Vital Signs BP 111/73   Pulse 81   Temp 98 F (36.7 C) (Oral)   Resp 20  Ht 5' (1.524 m)   Wt 76.2 kg (168 lb)   SpO2 97%   BMI 32.81 kg/m   Physical Exam  Constitutional: She is oriented to person, place, and time. She appears well-developed and well-nourished.  Non-toxic appearance. No distress.  HENT:  Head: Normocephalic and atraumatic.  Nose: Nose normal.  Mouth/Throat: Oropharynx is clear and moist.  Eyes: Pupils are equal, round, and reactive to light. Conjunctivae are normal. Right eye exhibits no discharge. Left eye exhibits no discharge.  Neck: Normal range of motion. Neck supple. No JVD present. No tracheal deviation present.  Cardiovascular: Normal rate, regular rhythm, normal heart sounds and intact distal pulses. Exam reveals no gallop and no friction rub.  No murmur heard. Pulses:      Radial pulses are 2+ on the right side, and 2+ on the left side.       Dorsalis pedis pulses are 2+ on the right side, and 2+ on the left side.  Pulmonary/Chest: Effort normal and breath sounds normal. No stridor. No respiratory distress. She has no decreased breath sounds. She has no wheezes. She has no rhonchi. She has no rales. She exhibits tenderness (anterior chest wall tendenress).  No hypoxia or tachypnea.  Abdominal: Soft. Bowel sounds are normal. She exhibits no distension. There is no tenderness. There is no rebound and no guarding.  Musculoskeletal: Normal range of motion.    No lower extremity edema or calf tenderness.  Lymphadenopathy:    She has no cervical adenopathy.  Neurological: She is alert and oriented to person, place, and time.  Skin: Skin is warm and dry. Capillary refill takes less than 2 seconds. She is not diaphoretic.  Psychiatric: Her behavior is normal. Judgment and thought content normal.  Nursing note and vitals reviewed.    ED Treatments / Results  Labs (all labs ordered are listed, but only abnormal results are displayed) Labs Reviewed  BASIC METABOLIC PANEL - Abnormal; Notable for the following components:      Result Value   Glucose, Bld 354 (*)    All other components within normal limits  CBC  TROPONIN I  D-DIMER, QUANTITATIVE (NOT AT [facility])  TROPONIN I    EKG EKG Interpretation  Date/Time:  Sunday December 15 2017 11:30:00 EDT Ventricular Rate:  90 PR Interval:  128 QRS Duration: 80 QT Interval:  352 QTC Calculation: 430 R Axis:   3 Text Interpretation:  Normal sinus rhythm Nonspecific T wave abnormality No significant change since last tracing Confirmed by Lajean Saver [phone]) on 12/15/2017 12:03:35 PM   Radiology Dg Chest 2 View  Result Date: 12/15/2017 CLINICAL DATA:  Chest pain for 1 week, heart fluttering, coughing, history asthma, type II diabetes mellitus, hypertension, irritable bowel syndrome EXAM: CHEST - 2 VIEW COMPARISON:  03/04/2016 FINDINGS: Normal heart size, mediastinal contours, and pulmonary vascularity. Lungs clear. No pleural effusion or pneumothorax. Bones unremarkable. IMPRESSION: Normal exam. Electronically Signed   By: Lavonia Dana M.D.   On: 12/15/2017 12:07    Procedures Procedures (including critical care time)  Medications Ordered in ED Medications  ketorolac (TORADOL) 30 MG/ML injection 15 mg (15 mg Intravenous Given 12/15/17 1331)  gi cocktail (Maalox,Lidocaine,Donnatal) (30 mLs Oral Given 12/15/17 1328)     Initial Impression / Assessment and Plan / ED Course  I have reviewed the  triage vital signs and the nursing notes.  Pertinent labs & imaging results that were available during my care of the patient were reviewed by me and considered in my medical  decision making (see chart for details).     Pt presents to the Ed today with complaints of cp. Patient is to be discharged with recommendation to follow up with PCP in regards to today's hospital visit. Chest pain is not likely of cardiac or pulmonary etiology d/t presentation, perc negative/d-dimer negative, VSS, no tracheal deviation, no JVD or new murmur, RRR, breath sounds equal bilaterally, EKG without any change from prior tracing and shows no signs of acute ischemia notes nonspecific T wave abnormalities, heart pathway score 2, negative delta troponin, and negative CXR.  Medical presentation does not seem consistent with PE, dissection, ACS, pneumonia.  Patient overall well-appearing and nontoxic.  Vital signs are reassuring.  Lab work has been reassuring without any leukocytosis.  Normal kidney function.  Glucose with history of same given patient's history of diabetes.  Pt has been advised to return to the ED is CP becomes exertional, associated with diaphoresis or nausea, radiates to left jaw/arm, worsens or becomes concerning in any way.   Pt is hemodynamically stable, in NAD, & able to ambulate in the ED. Evaluation does not show pathology that would require ongoing emergent intervention or inpatient treatment. I explained the diagnosis to the patient. Pain has been managed & has no complaints prior to dc. Pt is comfortable with above plan and is stable for discharge at this time. All questions were answered prior to disposition. Strict return precautions for f/u to the ED were discussed. Encouraged follow up with PCP.       Final Clinical Impressions(s) / ED Diagnoses   Final diagnoses:  Nonspecific chest pain    ED Discharge Orders    None       Aaron Edelman 12/15/17 1643    Lajean Saver, MD 12/24/17 [phone]

## 2017-12-15 NOTE — ED Triage Notes (Signed)
Chest pain and palpitations intermittently over the past month.

## 2017-12-15 NOTE — Discharge Instructions (Signed)
Your workup is reassuring today in the ED.  Unknown cause of your symptoms.  No signs of blood clot or heart attack at this time.  Would encourage NSAIDs and possible over-the-counter antacid such as Prilosec or Pepcid.  Have given you cardiology follow-up.  Return to the ED with any worsening symptoms.  Follow-up with primary care doctor.

## 2017-12-15 NOTE — ED Notes (Signed)
Pt verbalized understanding to f/u with PCP and cardiology. Ambulatory to d/c window with steady gait

## 2018-01-27 ENCOUNTER — Ambulatory Visit: Payer: Medicare Other | Admitting: Cardiovascular Disease

## 2018-01-27 ENCOUNTER — Encounter

## 2018-02-07 ENCOUNTER — Ambulatory Visit (INDEPENDENT_AMBULATORY_CARE_PROVIDER_SITE_OTHER): Payer: Medicare Other | Admitting: Psychology

## 2018-02-07 DIAGNOSIS — F411 Generalized anxiety disorder: Secondary | ICD-10-CM | POA: Diagnosis not present

## 2018-02-07 DIAGNOSIS — F332 Major depressive disorder, recurrent severe without psychotic features: Secondary | ICD-10-CM | POA: Diagnosis not present

## 2018-02-10 ENCOUNTER — Encounter: Payer: Self-pay | Admitting: Internal Medicine

## 2018-02-14 ENCOUNTER — Ambulatory Visit (INDEPENDENT_AMBULATORY_CARE_PROVIDER_SITE_OTHER): Payer: Medicare Other | Admitting: Psychology

## 2018-02-14 DIAGNOSIS — F332 Major depressive disorder, recurrent severe without psychotic features: Secondary | ICD-10-CM

## 2018-02-14 DIAGNOSIS — F411 Generalized anxiety disorder: Secondary | ICD-10-CM

## 2018-02-19 ENCOUNTER — Ambulatory Visit (INDEPENDENT_AMBULATORY_CARE_PROVIDER_SITE_OTHER): Payer: Medicare Other | Admitting: Psychology

## 2018-02-19 DIAGNOSIS — F332 Major depressive disorder, recurrent severe without psychotic features: Secondary | ICD-10-CM | POA: Diagnosis not present

## 2018-02-19 DIAGNOSIS — F411 Generalized anxiety disorder: Secondary | ICD-10-CM | POA: Diagnosis not present

## 2018-02-21 ENCOUNTER — Ambulatory Visit: Payer: Medicare Other | Admitting: Psychology

## 2018-02-21 ENCOUNTER — Ambulatory Visit (INDEPENDENT_AMBULATORY_CARE_PROVIDER_SITE_OTHER): Payer: Medicare Other | Admitting: Psychology

## 2018-02-21 DIAGNOSIS — F332 Major depressive disorder, recurrent severe without psychotic features: Secondary | ICD-10-CM | POA: Diagnosis not present

## 2018-02-21 DIAGNOSIS — F411 Generalized anxiety disorder: Secondary | ICD-10-CM

## 2018-02-28 ENCOUNTER — Ambulatory Visit: Payer: Medicare Other | Admitting: Psychology

## 2018-03-07 ENCOUNTER — Ambulatory Visit (INDEPENDENT_AMBULATORY_CARE_PROVIDER_SITE_OTHER): Payer: Medicare Other | Admitting: Psychology

## 2018-03-07 DIAGNOSIS — F332 Major depressive disorder, recurrent severe without psychotic features: Secondary | ICD-10-CM

## 2018-03-07 DIAGNOSIS — F411 Generalized anxiety disorder: Secondary | ICD-10-CM | POA: Diagnosis not present

## 2018-03-13 ENCOUNTER — Ambulatory Visit (INDEPENDENT_AMBULATORY_CARE_PROVIDER_SITE_OTHER): Payer: Medicare Other | Admitting: Internal Medicine

## 2018-03-13 ENCOUNTER — Encounter: Payer: Self-pay | Admitting: Internal Medicine

## 2018-03-13 ENCOUNTER — Ambulatory Visit: Payer: Medicare Other | Admitting: Psychology

## 2018-03-13 VITALS — BP 118/72 | HR 101 | Ht 60.43 in | Wt 152.2 lb

## 2018-03-13 DIAGNOSIS — Z8639 Personal history of other endocrine, nutritional and metabolic disease: Secondary | ICD-10-CM

## 2018-03-13 DIAGNOSIS — Z794 Long term (current) use of insulin: Secondary | ICD-10-CM | POA: Diagnosis not present

## 2018-03-13 DIAGNOSIS — E1165 Type 2 diabetes mellitus with hyperglycemia: Secondary | ICD-10-CM | POA: Diagnosis not present

## 2018-03-13 DIAGNOSIS — E063 Autoimmune thyroiditis: Secondary | ICD-10-CM

## 2018-03-13 LAB — POCT GLYCOSYLATED HEMOGLOBIN (HGB A1C): Hemoglobin A1C: 12.5 % — AB (ref 4.0–5.6)

## 2018-03-13 MED ORDER — EMPAGLIFLOZIN 10 MG PO TABS: 10.0000 mg | ORAL_TABLET | Freq: Every day | ORAL | 11 refills | Status: DC

## 2018-03-13 MED ORDER — INSULIN LISPRO 100 UNIT/ML (KWIKPEN): PEN_INJECTOR | SUBCUTANEOUS | 3 refills | Status: DC

## 2018-03-13 NOTE — Progress Notes (Signed)
Patient ID: Lisa Cole, female   DOB: [birth year], 37 y.o.   MRN: [id number]  HPI: Lisa Cole is a 37 y.o.-year-old female, referred by Dr. Zigmund Daniel for management of DM2, dx as GDM (at 37 y/o), then DM2  since 2000 insulin-dependent, uncontrolled, with complications: PN). She was previously seeing Dr. Meredith Pel and Dr. Chalmers Cater.  Last visit with me  1.5 years ago! PCP: Dr Jearld Pies + [facility]  Last hemoglobin A1c was: Lab Results  Component Value Date   HGBA1C 12.5 09/05/2016   HGBA1C 11.3 11/29/2015   HGBA1C 11.3 09/07/2015  11/03/2015: HbA1c 12.2% 06/03/2014: HbA1c 8.5% 11/23/2013: HbA1c 7.9%  Previously on: - Lantus 10 >> 30  >> 40-50 units at bedtime - may forget 1-2x a week; mentions occasional cough and wheezing - She stopped Januvia 100 mg in the past - She stopped Invokana (started by PCP) - mm spasms; this was retried 05/2017 >> thrush, yeast inf - Metformin IR and ER >> N/V/D/"spacing out" - Victoza >> dysphagia and rash - Actos >> no pbs   At this visit: - NovoLog >> Humalog 40-60 units before meals - but itching, wheezing  She was on Lantus 40 units at bedtime >> stopped since last visit  Pt checks her sugars 1X a day per meter download- : - am: 165, 224-315 >> 225-347 >> 329-364 - 2h after b'fast: 259, 304 >> n/c - before lunch: 201-301 >> 285 >> n/c - 2h after lunch: n/c >> 348 >> n/c - before dinner: n/c - 2h after dinner: 315, 328 >> n/c - bedtime: n/c - nighttime: n/c Lowest sugar was 165 >> 280 >> 329; she has hypoglycemia awareness in the 70s.  Highest sugar was 300s >> 410 >> 364 She was in the emergency room with hyperglycemia but without DKA in 02/2016.  Glucometer: AccuChek [employer]iva  Her diet is mainly fast food, but she mentions decreased appetite. She saw Antonieta Iba with nutrition in the past. Stopped sodas since last visit.  - No CKD, last BUN/creatinine:  Lab Results  Component Value Date   BUN 9 12/15/2017   CREATININE 0.61  12/15/2017  11/03/2015: 9/0.7, Glu 315, ACR 4.1 09/07/2015: ACR 15 06/03/2014: ACR <2.2 Not on an ACE inhibitor/ARB. Supposed to be on Lisinopril...  - + HL; last set of lipids: 10/05/2016: 231/283/34/40 11/03/2015: 139/169/33/72 09/07/2015: 143/143/36/78 Lab Results  Component Value Date   CHOL 111 02/16/2010   HDL 35.50 (L) 02/16/2010   LDLCALC 56 02/16/2010   TRIG 100.0 02/16/2010   CHOLHDL 3 02/16/2010  Not on a statin.  - last eye exam was in 09/2014: No DR  - + numbness and tingling in her feet.  She also has a history of thyroid nodules.  I reviewed the thyroid ultrasound report from 03/24/2014 and there were no worrisome thyroid nodules, only a small 3 mm nodule in the right lobe.  She apparently had another ultrasound in 11/2014, however, this is not available to me.    Review latest TFTs: 11/03/2015: TSH 0.89 11/03/2015: TSH 0.89, free T4  0.85 (0.6-1.5) Lab Results  Component Value Date   TSH 0.90 12/07/2010   She will see cardiology soon.  ROS: Constitutional: + weight loss, + fatigue, no subjective hyperthermia, no subjective hypothermia Eyes: + Blurry vision, no xerophthalmia ENT: no sore throat, no nodules palpated in throat, no dysphagia, no odynophagia, no hoarseness Cardiovascular: + CP/no SOB/+ palpitations/no leg swelling Respiratory: no cough/no SOB/+ wheezing Gastrointestinal: no N/no V/no D/no C/no acid reflux  Musculoskeletal: + Muscle aches/+ joint aches Skin: no rashes, no hair loss, + stretch marks Neurological: no tremors/+ numbness/+ tingling/no dizziness, + headache  I reviewed pt's medications, allergies, PMH, social hx, family hx, and changes were documented in the history of present illness. Otherwise, unchanged from my initial visit note.  Past Medical History:  Diagnosis Date  . Anemia   . Anxiety disorder   . Arthritis   . Asthma   . BV (bacterial vaginosis)   . Chronic pelvic pain in female   . Diabetes mellitus   .  Endometriosis   . Hashimoto thyroiditis   . Hepatic steatosis   . Hypertension   . IBS (irritable bowel syndrome)   . IC (interstitial cystitis)   . Obesity   . OSA (obstructive sleep apnea)   . SVT (supraventricular tachycardia) ([place])   . Type 2 diabetes mellitus ([place])    Past Surgical History:  Procedure Laterality Date  . bladder staple    . CYSTOSTOMY W/ BLADDER BIOPSY    . SHOULDER SURGERY  2008   right  . TOTAL ABDOMINAL HYSTERECTOMY  2004  . TUBAL LIGATION  2001  . WRIST SURGERY  2008   right   Social History   Social History  . Marital Status: Divorced    Spouse Name: N/A  . Number of Children: 3   Occupational History  .  retirement    Social History Main Topics  . Smoking status: Never Smoker   . Smokeless tobacco: Never Used  . Alcohol Use: No  . Drug Use: No   Social History Narrative   Exercise: yes, working out w/ Physiological scientist   Diet: incorporated more fruits and vegetables    Current Outpatient Medications on File Prior to Visit  Medication Sig Dispense Refill  . albuterol (PROVENTIL HFA;VENTOLIN HFA) 108 (90 Base) MCG/ACT inhaler Inhale 2 puffs into the lungs every 4 (four) hours as needed for wheezing or shortness of breath.     Marland Kitchen aspirin EC 81 MG tablet Take 81 mg by mouth daily.    . budesonide-formoterol (SYMBICORT) 160-4.5 MCG/ACT inhaler Inhale 2 puffs into the lungs 2 (two) times daily. (Patient not taking: Reported on 09/05/2016) 1 Inhaler 6  . glucose blood (ONETOUCH VERIO) test strip Use as instructed to check sugar 2 times daily. 200 each 3  . insulin aspart (NOVOLOG FLEXPEN) 100 UNIT/ML FlexPen Inject 10-15 Units into the skin 3 (three) times daily before meals. 10-15 min before meals 15 mL 3  . insulin lispro (HUMALOG KWIKPEN) 100 UNIT/ML KiwkPen 10-15 units 3 times daily before meals. 15 pen 1  . Lancet Devices (ONE TOUCH DELICA LANCING DEV) MISC Use to check sugar 2 times daily. 200 each 3  . LANTUS SOLOSTAR 100 UNIT/ML Solostar  Pen inject 40 units subcutaneously at bedtime (Patient taking differently: units40 subcutaneously at bedtime) 15 mL 0  . lisinopril (PRINIVIL,ZESTRIL) 5 MG tablet Take 5 mg by mouth daily. Reported on 03/04/2016  0  . omeprazole (PRILOSEC) 20 MG capsule Take 1 capsule by mouth daily.     No current facility-administered medications on file prior to visit.    Allergies  Allergen Reactions  . Liraglutide Hives, Itching and Other (See Comments)    "Burning sensation" "Burning sensation"  . Codeine Nausea Only  . Acetaminophen Other (See Comments)    Advised not to take due to peptic ulcer  . Ibuprofen Other (See Comments)    Advised not to take due to peptic ulcer  .  Amoxicillin Swelling and Rash    Has patient had a PCN reaction causing immediate rash, facial/tongue/throat swelling, SOB or lightheadedness with hypotension:yes Has patient had a PCN reaction causing severe rash involving mucus membranes or skin necrosis: no Has patient had a PCN reaction that required hospitalization: yes Has patient had a PCN reaction occurring within the last 10 years: no If all of the above answers are "NO", then may proceed with Cephalosporin use.   . Metformin Diarrhea    "GI upset even with Metformin XR"  . Penicillins Swelling and Rash    Has patient had a PCN reaction causing immediate rash, facial/tongue/throat swelling, SOB or lightheadedness with hypotension: Yes Has patient had a PCN reaction causing severe rash involving mucus membranes or skin necrosis: No Has patient had a PCN reaction that required hospitalization Yes Has patient had a PCN reaction occurring within the last 10 years: No If all of the above answers are "NO", then may proceed with Cephalosporin use.    Family History  Problem Relation Age of Onset  . Hypertension Father   . Alcohol abuse Unknown   . Hypertension Unknown   . Hyperlipidemia Unknown   . Kidney disease Unknown   . Colon cancer Unknown        uncle  .  Prostate cancer Unknown        uncles and grandfather  . Cirrhosis Unknown        both grandmother and grandfather  . Lung cancer Unknown   . Heart disease Unknown   . Thyroid disease Unknown   . Colon polyps Unknown   . Liver disease Unknown   . Arthritis Maternal Grandmother   . Diabetes Daughter        type 2   PE: BP 118/72   Pulse (!) 101   Ht 5' 0.43" (1.535 m)   Wt 152 lb 3.2 oz (69 kg)   SpO2 98%   BMI 29.30 kg/m  Body mass index is 29.3 kg/m.  Wt Readings from Last 3 Encounters:  03/13/18 152 lb 3.2 oz (69 kg)  12/15/17 168 lb (76.2 kg)  09/05/16 170 lb 3.2 oz (77.2 kg)   Constitutional: overweight, in NAD Eyes: PERRLA, EOMI, no exophthalmos ENT: moist mucous membranes, no thyromegaly, no cervical lymphadenopathy Cardiovascular: tachycardia, RR, No MRG Respiratory: CTA B Gastrointestinal: abdomen soft, NT, ND, BS+ Musculoskeletal: no deformities, strength intact in all 4 Skin: moist, warm, no rashes Neurological: no tremor with outstretched hands, DTR normal in all 4  ASSESSMENT: 1. DM2, insulin-dependent, uncontrolled, without complications  2.  History of thyroid nodules  3.  Hashimoto's thyroiditis  PLAN:  1. Patient with long-standing, uncontrolled, type 2 diabetes, returning after another long absence.  She is not compliant with visits and medications.  She continues to have a very poor diet consisting mostly of fast food.  We discussed at length in the past about the absolute need to improve her diet.  She does not use sodas anymore. - At last visit, we added mealtime insulin, in the form of NovoLog 10 to 50 minutes before meals.  We continued her Lantus dose, 40 units at bedtime.  And HbA1c at that time was very high, 12.5% - At this visit, she tells me that she is only taking Humalog, at higher doses than recommended, 40 to 60 units per meal.  Even on this regimen, her sugars are in the 300s in the morning. HbA1c is 12.5% (very high)  - she is  occasionally taking  Invokana for few days to improve her sugars, but she cannot take it for longer due to yeast infections - We discussed that we absolutely need to add back Lantus, will start back on 40 units daily. - We will decrease her dose of NovoLog while we are adding Lantus - We will also try to start the low-dose Jardiance, since she mentions that her sugars respond very well to SGLT2 inhibitors.  Advised her to stay very well-hydrated while on this. - Discussed about the consequences of uncontrolled diabetes and strongly advised her to start gaining control of her diabetes  - I suggested to:  Patient Instructions  Please start: - Lantus 40 units at bedtime  Please decrease: - Humalog to 15-20 units before meals  Add: - Jardiance 10 mg before b'fast  Please return in 3 months with your sugar log.   - Start checking sugars at different times of the day - check 3x a day, rotating checks - advised for yearly eye exams >> she is not UTD - Return to clinic in 3 mo with sugar log   2.  History of thyroid nodules - No neck compression symptoms - Reviewed she apparently had another latest thyroid ultrasound report: Only a small 3 mm thyroid nodule.  No ultrasound needed for further follow-up unless she starts developing neck compression symptoms.  She had an ultrasound performed in 2016 and she will get the report sent to me from [facility]. - Review TFTs from 10/2015, which have been normal  3.  Hashimoto's thyroiditis - Not on levothyroxine yet as TFTs have been normal - We will repeat them at next visit  [name], MD PhD [facility]

## 2018-03-13 NOTE — Addendum Note (Signed)
Addended by: Drucilla Schmidt on: 03/13/2018 01:15 PM   Modules accepted: Orders

## 2018-03-13 NOTE — Patient Instructions (Addendum)
Please start: - Lantus 40 units at bedtime  Please decrease: - Humalog to 15-20 units before meals  Add: - Jardiance 10 mg before b'fast  Please return in 3 months with your sugar log.

## 2018-03-14 ENCOUNTER — Ambulatory Visit: Payer: Medicare Other | Admitting: Psychology

## 2018-03-19 DIAGNOSIS — G5601 Carpal tunnel syndrome, right upper limb: Secondary | ICD-10-CM | POA: Insufficient documentation

## 2018-03-19 DIAGNOSIS — M778 Other enthesopathies, not elsewhere classified: Secondary | ICD-10-CM | POA: Insufficient documentation

## 2018-04-07 ENCOUNTER — Ambulatory Visit (INDEPENDENT_AMBULATORY_CARE_PROVIDER_SITE_OTHER): Payer: Medicare Other | Admitting: Psychology

## 2018-04-07 DIAGNOSIS — F411 Generalized anxiety disorder: Secondary | ICD-10-CM | POA: Diagnosis not present

## 2018-04-07 DIAGNOSIS — F332 Major depressive disorder, recurrent severe without psychotic features: Secondary | ICD-10-CM | POA: Diagnosis not present

## 2018-04-10 ENCOUNTER — Ambulatory Visit (INDEPENDENT_AMBULATORY_CARE_PROVIDER_SITE_OTHER): Payer: Medicare Other | Admitting: Cardiovascular Disease

## 2018-04-10 ENCOUNTER — Telehealth: Payer: Self-pay | Admitting: *Deleted

## 2018-04-10 ENCOUNTER — Encounter: Payer: Self-pay | Admitting: Cardiovascular Disease

## 2018-04-10 ENCOUNTER — Encounter

## 2018-04-10 VITALS — BP 130/85 | HR 81 | Ht 60.0 in | Wt 151.0 lb

## 2018-04-10 DIAGNOSIS — R0789 Other chest pain: Secondary | ICD-10-CM

## 2018-04-10 DIAGNOSIS — R079 Chest pain, unspecified: Secondary | ICD-10-CM

## 2018-04-10 DIAGNOSIS — R002 Palpitations: Secondary | ICD-10-CM

## 2018-04-10 DIAGNOSIS — R0681 Apnea, not elsewhere classified: Secondary | ICD-10-CM

## 2018-04-10 DIAGNOSIS — Z13220 Encounter for screening for lipoid disorders: Secondary | ICD-10-CM

## 2018-04-10 HISTORY — DX: Other chest pain: R07.89

## 2018-04-10 LAB — CBC WITH DIFFERENTIAL/PLATELET
Basophils Absolute: 0 10*3/uL (ref 0.0–0.2)
Basos: 1 %
EOS (ABSOLUTE): 0.1 10*3/uL (ref 0.0–0.4)
EOS: 1 %
HEMATOCRIT: 40.9 % (ref 34.0–46.6)
HEMOGLOBIN: 13.9 g/dL (ref 11.1–15.9)
IMMATURE GRANS (ABS): 0 10*3/uL (ref 0.0–0.1)
IMMATURE GRANULOCYTES: 0 %
Lymphocytes Absolute: 2.4 10*3/uL (ref 0.7–3.1)
Lymphs: 42 %
MCH: 30.1 pg (ref 26.6–33.0)
MCHC: 34 g/dL (ref 31.5–35.7)
MCV: 89 fL (ref 79–97)
MONOCYTES: 5 %
Monocytes Absolute: 0.3 10*3/uL (ref 0.1–0.9)
NEUTROS PCT: 51 %
Neutrophils Absolute: 2.9 10*3/uL (ref 1.4–7.0)
Platelets: 206 10*3/uL (ref 150–450)
RBC: 4.62 x10E6/uL (ref 3.77–5.28)
RDW: 13.9 % (ref 12.3–15.4)
WBC: 5.6 10*3/uL (ref 3.4–10.8)

## 2018-04-10 LAB — LIPID PANEL
Chol/HDL Ratio: 3.7 ratio (ref 0.0–4.4)
Cholesterol, Total: 141 mg/dL (ref 100–199)
HDL: 38 mg/dL — ABNORMAL LOW (ref >39)
LDL Calculated: 78 mg/dL (ref 0–99)
Triglycerides: 127 mg/dL (ref 0–149)
VLDL Cholesterol Cal: 25 mg/dL (ref 5–40)

## 2018-04-10 LAB — MAGNESIUM: Magnesium: 2 mg/dL (ref 1.6–2.3)

## 2018-04-10 LAB — TSH: TSH: 0.822 u[IU]/mL (ref 0.450–4.500)

## 2018-04-10 LAB — BASIC METABOLIC PANEL
BUN / CREAT RATIO: 14 (ref 9–23)
BUN: 10 mg/dL (ref 6–20)
CALCIUM: 10 mg/dL (ref 8.7–10.2)
CHLORIDE: 102 mmol/L (ref 96–106)
CO2: 23 mmol/L (ref 20–29)
Creatinine, Ser: 0.74 mg/dL (ref 0.57–1.00)
GFR calc Af Amer: 119 mL/min/{1.73_m2} (ref >59)
GFR calc non Af Amer: 103 mL/min/{1.73_m2} (ref >59)
GLUCOSE: 287 mg/dL — AB (ref 65–99)
Potassium: 4.4 mmol/L (ref 3.5–5.2)
Sodium: 143 mmol/L (ref 134–144)

## 2018-04-10 LAB — T4, FREE: Free T4: 1.46 ng/dL (ref 0.82–1.77)

## 2018-04-10 MED ORDER — METOPROLOL TARTRATE 50 MG PO TABS: ORAL_TABLET | ORAL | 0 refills | Status: DC

## 2018-04-10 NOTE — Progress Notes (Signed)
Cardiology Office Note   Date:  04/10/2018   ID:  Lisa Cole, Lisa Cole Jan 20, 1981, MRN [id number]  PCP:  Elisabeth Cara, PA-C  Cardiologist:   Skeet Latch, MD   Chief Complaint  Patient presents with  . New Patient (Initial Visit)  . Leg Pain    cramping in legs at night.   . Dizziness    occasionally; randomly.      History of Present Illness: Lisa Cole is a 37 y.o. female with asthma, diabetes, obstructive sleep apnea, obesity, and SVT who is being seen today for the evaluation of chest pain and palpitations at the request of [place], [place], *.  Lisa Cole she reports being diagnosed with sinus tachycardia at age 67.  She was initially treated with metoprolol and felt better.  However this was discontinued when she became pregnant at age 1.  That pregnancy was complicated by gestational diabetes and she is had diabetes ever since that time.  Her diabetes has not been well-controlled.  She has a long-standing history of chest pain and palpitations.  Lisa Cole was seen in the ED 11/2017 with chest pain and palpitations.  This occurred in the setting of being treated for an upper respiratory infection.  Cardiac enzymes and d-dimer were negative.  She was noted to have chest wall tenderness on exam.  EKG was without acute changes.  She was instructed to follow-up with her PCP she has frequent episodes of chest pain and cardiology.  That she describes as feeling like a toothache.  It is worse when she gets upset or when she drinks ice cold water.  She recently started by exercising with Zumba and hip hop dance and exertional chest pain or shortness of breath.  She also reports frequent episodes of dizziness followed by a drop in her blood pressure.  This occurs less than once per month.  She is unable to recall if it happens more with changes in position or exertion.  She also reports heart racing that is most prominent at night.  She denies syncope.  Ms.  Cole has been evaluated by cardiologist at [facility] and also had no want.  She reports having extensive testing that showed sinus tachycardia but she does not remember any other diagnoses.  She has a diagnosis of OSA but hasn't used her CPAP in years.  Her father had an MI in his early 39s.    Past Medical History:  Diagnosis Date  . Anemia   . Anxiety disorder   . Arthritis   . Asthma   . Atypical chest pain 04/10/2018  . BV (bacterial vaginosis)   . Chronic pelvic pain in female   . Diabetes mellitus   . Endometriosis   . Hashimoto thyroiditis   . Hepatic steatosis   . Hypertension   . IBS (irritable bowel syndrome)   . IC (interstitial cystitis)   . Obesity   . OSA (obstructive sleep apnea)   . SVT (supraventricular tachycardia) ([place])   . Type 2 diabetes mellitus ([place])     Past Surgical History:  Procedure Laterality Date  . bladder staple    . CYSTOSTOMY W/ BLADDER BIOPSY    . SHOULDER SURGERY  2008   right  . TOTAL ABDOMINAL HYSTERECTOMY  2004  . TUBAL LIGATION  2001  . WRIST SURGERY  2008   right     Current Outpatient Medications  Medication Sig Dispense Refill  . albuterol (PROVENTIL HFA;VENTOLIN HFA) 108 (90  Base) MCG/ACT inhaler Inhale 2 puffs into the lungs every 4 (four) hours as needed for wheezing or shortness of breath.     Marland Kitchen aspirin EC 81 MG tablet Take 81 mg by mouth daily.    . budesonide-formoterol (SYMBICORT) 160-4.5 MCG/ACT inhaler Inhale 2 puffs into the lungs 2 (two) times daily. 1 Inhaler 6  . empagliflozin (JARDIANCE) 10 MG TABS tablet Take 10 mg by mouth daily. 30 tablet 11  . glucose blood (ONETOUCH VERIO) test strip Use as instructed to check sugar 2 times daily. 200 each 3  . insulin aspart (NOVOLOG FLEXPEN) 100 UNIT/ML FlexPen Inject 10-15 Units into the skin 3 (three) times daily before meals. 10-15 min before meals 15 mL 3  . insulin lispro (HUMALOG KWIKPEN) 100 UNIT/ML KiwkPen Inject under skin 15-20 units 3 times daily  before meals. 15 pen 3  . Lancet Devices (ONE TOUCH DELICA LANCING DEV) MISC Use to check sugar 2 times daily. 200 each 3  . LANTUS SOLOSTAR 100 UNIT/ML Solostar Pen inject 40 units subcutaneously at bedtime (Patient taking differently: units40 subcutaneously at bedtime) 15 mL 0  . lisinopril (PRINIVIL,ZESTRIL) 5 MG tablet Take 5 mg by mouth daily. Reported on 03/04/2016  0  . omeprazole (PRILOSEC) 20 MG capsule Take 1 capsule by mouth daily.    . metoprolol tartrate (LOPRESSOR) 50 MG tablet TAKE 1 TABLET 1 HOUR PRIOR TO YOUR CT SCAN 1 tablet 0   No current facility-administered medications for this visit.     Allergies:   Liraglutide; Codeine; Acetaminophen; Ibuprofen; Amoxicillin; Metformin; and Penicillins    Social History:  The patient  reports that she has never smoked. She has never used smokeless tobacco. She reports that she does not drink alcohol or use drugs.   Family History:  The patient's family history includes Alcohol abuse in her unknown relative; Arthritis in her maternal grandmother and sister; Cirrhosis in her unknown relative; Colon cancer in her unknown relative; Colon polyps in her unknown relative; Diabetes in her daughter, father, mother, paternal grandfather, and paternal grandmother; Glaucoma in her paternal grandmother; Heart attack in her father; Heart disease in her maternal grandfather and unknown relative; Heart failure in her maternal grandmother and paternal grandmother; Hyperlipidemia in her unknown relative; Hypertension in her daughter, father, paternal grandfather, paternal grandmother, and unknown relative; Kidney disease in her unknown relative; Kidney failure in her maternal grandmother; Liver disease in her unknown relative; Lung cancer in her maternal grandfather and unknown relative; Prostate cancer in her unknown relative; Psoriasis in her sister; Thyroid disease in her unknown relative.    ROS:  Please see the history of present illness.   Otherwise,  review of systems are positive for insomnia, leg pain.   All other systems are reviewed and negative.    PHYSICAL EXAM: VS:  BP 130/85   Pulse 81   Ht 5' (1.524 m)   Wt 151 lb (68.5 kg)   BMI 29.49 kg/m  , BMI Body mass index is 29.49 kg/m. GENERAL:  Well appearing HEENT:  Pupils equal round and reactive, fundi not visualized, oral mucosa unremarkable NECK:  No jugular venous distention, waveform within normal limits, carotid upstroke brisk and symmetric, no bruits, no thyromegaly LYMPHATICS:  No cervical adenopathy LUNGS:  Clear to auscultation bilaterally HEART:  RRR.  PMI not displaced or sustained,S1 and S2 within normal limits, no S3, no S4, no clicks, no rubs,   murmurs ABD:  Flat, positive bowel sounds normal in frequency in pitch, no bruits, no  rebound, no guarding, no midline pulsatile mass, no hepatomegaly, no splenomegaly EXT:  2 plus pulses throughout, no edema, no cyanosis no clubbing SKIN:  No rashes no nodules NEURO:  Cranial nerves II through XII grossly intact, motor grossly intact throughout PSYCH:  Cognitively intact, oriented to person place and time   EKG:  EKG is not ordered today.   Recent Labs: 04/10/2018: BUN 10; Creatinine, Ser 0.74; Hemoglobin 13.9; Magnesium 2.0; Platelets 206; Potassium 4.4; Sodium 143; TSH 0.822    Lipid Panel    Component Value Date/Time   CHOL 141 04/10/2018 0935   TRIG 127 04/10/2018 0935   HDL 38 (L) 04/10/2018 0935   CHOLHDL 3.7 04/10/2018 0935   CHOLHDL 3 02/16/2010 1556   VLDL 20.0 02/16/2010 1556   LDLCALC 78 04/10/2018 0935      Wt Readings from Last 3 Encounters:  04/10/18 151 lb (68.5 kg)  03/13/18 152 lb 3.2 oz (69 kg)  12/15/17 168 lb (76.2 kg)      ASSESSMENT AND PLAN:  # Atypical chest pain:  No exertional symptoms.  This does not seem consistent with ischemia but she has a family history of premature CAD.  We will get her [facility] records.  We will get a coronary CT-A to better  assess.    # Palpitations: Documented episodes of sinus tachycardia.  We will get a copy of her work up from [facility] and [facility].   30 day event monitor.  Check TSH, free T4, CMP, CBC and magnesium.   # OSA: She has known sleep apnea that is untreated.  Refer for sleep study.  Current medicines are reviewed at length with the patient today.  The patient does not have concerns regarding medicines.  The following changes have been made:  no change  Labs/ tests ordered today include:   Orders Placed This Encounter  Procedures  . CT CORONARY MORPH W/CTA COR W/SCORE W/CA W/CM &/OR WO/CM  . CT CORONARY FRACTIONAL FLOW RESERVE DATA PREP  . CT CORONARY FRACTIONAL FLOW RESERVE FLUID ANALYSIS  . CBC with Differential/Platelet  . TSH  . T4, free  . Basic metabolic panel  . Magnesium  . Lipid panel  . CARDIAC EVENT MONITOR  . Split night study     Disposition:   FU with Shaday Rayborn C. Oval Linsey, MD, [facility] in 3 months.     Signed, Lucianna Ostlund C. Oval Linsey, MD, [facility]  04/10/2018 5:56 PM    [facility]

## 2018-04-10 NOTE — Patient Instructions (Addendum)
Medication Instructions:  TAKE METOPROLOL 50 MG ONE TIME 1 HOUR PRIOR TO CT SCAN   Labwork: FASTING LP/BMET/CBC/TSH/FT4/MAGNESIUM   Testing/Procedures: Your physician has recommended that you have a sleep study. This test records several body functions during sleep, including: brain activity, eye movement, oxygen and carbon dioxide blood levels, heart rate and rhythm, breathing rate and rhythm, the flow of air through your mouth and nose, snoring, body muscle movements, and chest and belly movement. WILL CALL YOU ONCE THE STUDY HAS BEEN APPROVED BY YOUR INSURANCE   Your physician has recommended that you wear an event monitor. Event monitors are medical devices that record the heart's electrical activity. Doctors most often [place] these monitors to diagnose arrhythmias. Arrhythmias are problems with the speed or rhythm of the heartbeat. The monitor is a small, portable device. You can wear one while you do your normal daily activities. This is usually used to diagnose what is causing palpitations/syncope (passing out). [place] [address]  Your physician has requested that you have cardiac CT. Cardiac computed tomography (CT) is a painless test that uses an x-ray machine to take clear, detailed pictures of your heart. For further information please visit [email or web address]. Please follow instruction sheet as given.  Follow-Up: Your physician recommends that you schedule a follow-up appointment in: 2-3 MONTHS   Any Other Special Instructions Will Be Listed Below (If Applicable).  Please arrive at the [facility] main entrance of [facility] at xx:xx AM (30-45 minutes prior to test start time)  [facility] [place], [place] [address] [phone]  Proceed to the [facility] Radiology Department (First Floor).  Please follow these instructions carefully (unless otherwise directed):  On the Night Before the Test: . Drink  plenty of water. . Do not consume any caffeinated/decaffeinated beverages or chocolate 12 hours prior to your test. . Do not take any antihistamines 12 hours prior to your test. . If you take Metformin do not take 24 hours prior to test. . If the patient has contrast allergy: ? Patient will need a prescription for Prednisone and very clear instructions (as follows): 1. Prednisone 50 mg - take 13 hours prior to test 2. Take another Prednisone 50 mg 7 hours prior to test 3. Take another Prednisone 50 mg 1 hour prior to test 4. Take Benadryl 50 mg 1 hour prior to test . Patient must complete all four doses of above prophylactic medications. . Patient will need a ride after test due to Benadryl.  On the Day of the Test: . Drink plenty of water. Do not drink any water within one hour of the test. . Do not eat any food 4 hours prior to the test. . You may take your regular medications prior to the test. . IF NOT ON A BETA BLOCKER - Take 50 mg of lopressor (metoprolol) one hour before the test. . HOLD Furosemide morning of the test.  After the Test: . Drink plenty of water. . After receiving IV contrast, you may experience a mild flushed feeling. This is normal. . On occasion, you may experience a mild rash up to 24 hours after the test. This is not dangerous. If this occurs, you can take Benadryl 25 mg and increase your fluid intake. . If you experience trouble breathing, this can be serious. If it is severe call 911 IMMEDIATELY. If it is mild, please call our office. . If you take any of these  medications: Glipizide/Metformin, Avandament, Glucavance, please do not take 48 hours after completing test.    Cardiac CT Angiogram A cardiac CT angiogram is a procedure to look at the heart and the area around the heart. It may be done to help find the cause of chest pains or other symptoms of heart disease. During this procedure, a large X-ray machine, called a CT scanner, takes detailed pictures of  the heart and the surrounding area after a dye (contrast material) has been injected into blood vessels in the area. The procedure is also sometimes called a coronary CT angiogram, coronary artery scanning, or CTA. A cardiac CT angiogram allows the health care provider to see how well blood is flowing to and from the heart. The health care provider will be able to see if there are any problems, such as:  Blockage or narrowing of the coronary arteries in the heart.  Fluid around the heart.  Signs of weakness or disease in the muscles, valves, and tissues of the heart.  Tell a health care provider about:  Any allergies you have. This is especially important if you have had a previous allergic reaction to contrast dye.  All medicines you are taking, including vitamins, herbs, eye drops, creams, and over-the-counter medicines.  Any blood disorders you have.  Any surgeries you have had.  Any medical conditions you have.  Whether you are pregnant or may be pregnant.  Any anxiety disorders, chronic pain, or other conditions you have that may increase your stress or prevent you from lying still. What are the risks? Generally, this is a safe procedure. However, problems may occur, including:  Bleeding.  Infection.  Allergic reactions to medicines or dyes.  Damage to other structures or organs.  Kidney damage from the dye or contrast that is used.  Increased risk of cancer from radiation exposure. This risk is low. Talk with your health care provider about: ? The risks and benefits of testing. ? How you can receive the lowest dose of radiation.  What happens before the procedure?  Wear comfortable clothing and remove any jewelry, glasses, dentures, and hearing aids.  Follow instructions from your health care provider about eating and drinking. This may include: ? For 12 hours before the test - avoid caffeine. This includes tea, coffee, soda, energy drinks, and diet pills. Drink  plenty of water or other fluids that do not have caffeine in them. Being well-hydrated can prevent complications. ? For 4-6 hours before the test - stop eating and drinking. The contrast dye can cause nausea, but this is less likely if your stomach is empty.  Ask your health care provider about changing or stopping your regular medicines. This is especially important if you are taking diabetes medicines, blood thinners, or medicines to treat erectile dysfunction. What happens during the procedure?  Hair on your chest may need to be removed so that small sticky patches called electrodes can be placed on your chest. These will transmit information that helps to monitor your heart during the test.  An IV tube will be inserted into one of your veins.  You might be given a medicine to control your heart rate during the test. This will help to ensure that good images are obtained.  You will be asked to lie on an exam table. This table will slide in and out of the CT machine during the procedure.  Contrast dye will be injected into the IV tube. You might feel warm, or you may get a  metallic taste in your mouth.  You will be given a medicine (nitroglycerin) to relax (dilate) the arteries in your heart.  The table that you are lying on will move into the CT machine tunnel for the scan.  The person running the machine will give you instructions while the scans are being done. You may be asked to: ? Keep your arms above your head. ? Hold your breath. ? Stay very still, even if the table is moving.  When the scanning is complete, you will be moved out of the machine.  The IV tube will be removed. The procedure may vary among health care providers and hospitals. What happens after the procedure?  You might feel warm, or you may get a metallic taste in your mouth from the contrast dye.  You may have a headache from the nitroglycerin.  After the procedure, drink water or other fluids to wash  (flush) the contrast material out of your body.  Contact a health care provider if you have any symptoms of allergy to the contrast. These symptoms include: ? Shortness of breath. ? Rash or hives. ? A racing heartbeat.  Most people can return to their normal activities right after the procedure. Ask your health care provider what activities are safe for you.  It is up to you to get the results of your procedure. Ask your health care provider, or the department that is doing the procedure, when your results will be ready. Summary  A cardiac CT angiogram is a procedure to look at the heart and the area around the heart. It may be done to help find the cause of chest pains or other symptoms of heart disease.  During this procedure, a large X-ray machine, called a CT scanner, takes detailed pictures of the heart and the surrounding area after a dye (contrast material) has been injected into blood vessels in the area.  Ask your health care provider about changing or stopping your regular medicines before the procedure. This is especially important if you are taking diabetes medicines, blood thinners, or medicines to treat erectile dysfunction.  After the procedure, drink water or other fluids to wash (flush) the contrast material out of your body. This information is not intended to replace advice given to you by your health care provider. Make sure you discuss any questions you have with your health care provider. Document Released: 08/23/2008 Document Revised: 07/30/2016 Document Reviewed: 07/30/2016 Elsevier Interactive Patient Education  2017 [employer].    Cardiac Event Monitoring A cardiac event monitor is a small recording device that is used to detect abnormal heart rhythms (arrhythmias). The monitor is used to record your heart rhythm when you have symptoms, such as:  Fast heartbeats (palpitations), such as heart racing or fluttering.  Dizziness.  Fainting or  light-headedness.  Unexplained weakness.  Some monitors are wired to electrodes placed on your chest. Electrodes are flat, sticky disks that attach to your skin. Other monitors may be hand-held or worn on the wrist. The monitor can be worn for up to 30 days. If the monitor is attached to your chest, a technician will prepare your chest for the electrode placement and show you how to work the monitor. Take time to practice using the monitor before you leave the office. Make sure you understand how to send the information from the monitor to your health care provider. In some cases, you may need to use a landline telephone instead of a cell phone. What are the risks?  Generally, this device is safe to use, but it possible that the skin under the electrodes will become irritated. How to use your cardiac event monitor  Wear your monitor at all times, except when you are in water: ? Do not let the monitor get wet. ? Take the monitor off when you bathe. Do not swim or use a hot tub with it on.  Keep your skin clean. Do not put body lotion or moisturizer on your chest.  Change the electrodes as told by your health care provider or any time they stop sticking to your skin. You may need to use medical tape to keep them on.  Try to put the electrodes in slightly different places on your chest to help prevent skin irritation. They must remain in the area under your left breast and in the upper right section of your chest.  Make sure the monitor is safely clipped to your clothing or in a location close to your body that your health care provider recommends.  Press the button to record as soon as you feel heart-related symptoms, such as: ? Dizziness. ? Weakness. ? Light-headedness. ? Palpitations. ? Thumping or pounding in your chest. ? Shortness of breath. ? Unexplained weakness.  Keep a diary of your activities, such as walking, doing chores, and taking medicine. It is very important to note what  you were doing when you pushed the button to record your symptoms. This will help your health care provider determine what might be contributing to your symptoms.  Send the recorded information as recommended by your health care provider. It may take some time for your health care provider to process the results.  Change the batteries as told by your health care provider.  Keep electronic devices away from your monitor. This includes: ? Tablets. ? MP3 players. ? Cell phones.  While wearing your monitor you should avoid: ? Electric blankets. ? Armed forces operational officer. ? Electric toothbrushes. ? Microwave ovens. ? Magnets. ? Metal detectors. Get help right away if:  You have chest pain.  You have extreme difficulty breathing or shortness of breath.  You develop a very fast heartbeat that persists.  You develop dizziness that does not go away.  You faint or constantly feel like you are about to faint. Summary  A cardiac event monitor is a small recording device that is used to help detect abnormal heart rhythms (arrhythmias).  The monitor is used to record your heart rhythm when you have heart-related symptoms.  Make sure you understand how to send the information from the monitor to your health care provider.  It is important to press the button on the monitor when you have any heart-related symptoms.  Keep a diary of your activities, such as walking, doing chores, and taking medicine. It is very important to note what you were doing when you pushed the button to record your symptoms. This will help your health care provider learn what might be causing your symptoms. This information is not intended to replace advice given to you by your health care provider. Make sure you discuss any questions you have with your health care provider. Document Released: 06/19/2008 Document Revised: 08/25/2016 Document Reviewed: 08/25/2016 Elsevier Interactive Patient Education  2017 [employer].

## 2018-04-10 NOTE — Telephone Encounter (Signed)
-----   Message from Earvin Hansen, LPN sent at [phone]/81/2751  9:51 AM EDT ----- Marykay Lex Ms [place] needs sleep study Thanks Rip Harbour

## 2018-04-10 NOTE — Telephone Encounter (Signed)
Patient notified per [facility] no PA is required to have sleep study. Appointment has been scheduled for 05/09/18.

## 2018-04-11 DIAGNOSIS — G5621 Lesion of ulnar nerve, right upper limb: Secondary | ICD-10-CM | POA: Insufficient documentation

## 2018-04-17 ENCOUNTER — Ambulatory Visit (INDEPENDENT_AMBULATORY_CARE_PROVIDER_SITE_OTHER): Payer: Medicare Other | Admitting: Psychology

## 2018-04-17 DIAGNOSIS — F332 Major depressive disorder, recurrent severe without psychotic features: Secondary | ICD-10-CM | POA: Diagnosis not present

## 2018-04-17 DIAGNOSIS — F411 Generalized anxiety disorder: Secondary | ICD-10-CM

## 2018-04-23 ENCOUNTER — Ambulatory Visit (INDEPENDENT_AMBULATORY_CARE_PROVIDER_SITE_OTHER): Payer: Medicare Other | Admitting: Psychology

## 2018-04-23 DIAGNOSIS — F331 Major depressive disorder, recurrent, moderate: Secondary | ICD-10-CM | POA: Diagnosis not present

## 2018-04-23 DIAGNOSIS — F411 Generalized anxiety disorder: Secondary | ICD-10-CM | POA: Diagnosis not present

## 2018-04-24 ENCOUNTER — Ambulatory Visit: Payer: Medicare Other

## 2018-04-24 ENCOUNTER — Telehealth: Payer: Self-pay | Admitting: Cardiology

## 2018-04-24 ENCOUNTER — Encounter: Payer: Self-pay | Admitting: Cardiovascular Disease

## 2018-04-24 ENCOUNTER — Encounter: Payer: Self-pay | Admitting: Cardiology

## 2018-04-24 NOTE — Telephone Encounter (Signed)
Received call from [employer] with abnormal EKG. Noted patient developed Aflutter with rate of 70 this afternoon around 2:20pm and had maintained for the past several hours. Company talked with patient and she did report some fluttering and palpitations at the time. Dr. Blenda Mounts patient and actually on call. Discussed and will message the [facility] clinic to arrange for appt this week. I talked with the patient who reported she felt dizzy earlier in the day, and has had issues with her blood pressure before but stable today. No chest pain or shortness of breath. Informed her the clinic would be calling with an appt. Given ER precautions and she voiced understanding. She is currently on daily 81mg  ASA. ChadVasc of possibly 2. Instructed she continue on her current medication regimen.   Reino Bellis NP

## 2018-04-25 ENCOUNTER — Encounter (INDEPENDENT_AMBULATORY_CARE_PROVIDER_SITE_OTHER): Payer: Self-pay

## 2018-04-25 ENCOUNTER — Telehealth: Payer: Self-pay | Admitting: *Deleted

## 2018-04-25 NOTE — Telephone Encounter (Signed)
Call placed to the patient. [employer] sent in several EKG readings that showed atrial flutter from 04/24/18 (2:30 pm, 4:26pm and 4:35 pm) Please see previous note from Reino Bellis, NP.  The patient stated that she was doing okay and was gong to rest this weekend. She has an appointment at the [facility] clinic on 04/28/18. She will call if anything is needed.

## 2018-04-28 ENCOUNTER — Ambulatory Visit (HOSPITAL_COMMUNITY)
Admission: RE | Admit: 2018-04-28 | Discharge: 2018-04-28 | Disposition: A | Discharge status: Home / Self Care | Payer: Medicare Other | Source: Ambulatory Visit | Attending: Nurse Practitioner | Admitting: Nurse Practitioner

## 2018-04-28 ENCOUNTER — Encounter (HOSPITAL_COMMUNITY): Payer: Self-pay | Admitting: Nurse Practitioner

## 2018-04-28 VITALS — BP 130/82 | HR 90 | Ht 60.0 in | Wt 149.0 lb

## 2018-04-28 DIAGNOSIS — R002 Palpitations: Secondary | ICD-10-CM

## 2018-04-28 DIAGNOSIS — I4891 Unspecified atrial fibrillation: Secondary | ICD-10-CM | POA: Diagnosis not present

## 2018-04-28 DIAGNOSIS — E063 Autoimmune thyroiditis: Secondary | ICD-10-CM | POA: Diagnosis not present

## 2018-04-28 DIAGNOSIS — K589 Irritable bowel syndrome without diarrhea: Secondary | ICD-10-CM | POA: Diagnosis not present

## 2018-04-28 DIAGNOSIS — E669 Obesity, unspecified: Secondary | ICD-10-CM | POA: Insufficient documentation

## 2018-04-28 DIAGNOSIS — I10 Essential (primary) hypertension: Secondary | ICD-10-CM | POA: Insufficient documentation

## 2018-04-28 DIAGNOSIS — J45909 Unspecified asthma, uncomplicated: Secondary | ICD-10-CM | POA: Diagnosis not present

## 2018-04-28 DIAGNOSIS — E119 Type 2 diabetes mellitus without complications: Secondary | ICD-10-CM | POA: Diagnosis not present

## 2018-04-28 DIAGNOSIS — Z79899 Other long term (current) drug therapy: Secondary | ICD-10-CM | POA: Diagnosis not present

## 2018-04-28 DIAGNOSIS — F419 Anxiety disorder, unspecified: Secondary | ICD-10-CM | POA: Diagnosis not present

## 2018-04-28 DIAGNOSIS — Z794 Long term (current) use of insulin: Secondary | ICD-10-CM | POA: Diagnosis not present

## 2018-04-28 DIAGNOSIS — Z6829 Body mass index (BMI) 29.0-29.9, adult: Secondary | ICD-10-CM | POA: Diagnosis not present

## 2018-04-28 DIAGNOSIS — Z886 Allergy status to analgesic agent status: Secondary | ICD-10-CM | POA: Insufficient documentation

## 2018-04-28 DIAGNOSIS — I471 Supraventricular tachycardia: Secondary | ICD-10-CM | POA: Insufficient documentation

## 2018-04-28 DIAGNOSIS — G4733 Obstructive sleep apnea (adult) (pediatric): Secondary | ICD-10-CM | POA: Diagnosis not present

## 2018-04-28 DIAGNOSIS — Z880 Allergy status to penicillin: Secondary | ICD-10-CM | POA: Diagnosis not present

## 2018-04-28 DIAGNOSIS — Z885 Allergy status to narcotic agent status: Secondary | ICD-10-CM | POA: Diagnosis not present

## 2018-04-28 DIAGNOSIS — Z888 Allergy status to other drugs, medicaments and biological substances status: Secondary | ICD-10-CM | POA: Insufficient documentation

## 2018-04-28 MED ORDER — METOPROLOL SUCCINATE ER 25 MG PO TB24: 25.0000 mg | ORAL_TABLET | Freq: Every day | ORAL | 3 refills | Status: DC

## 2018-04-28 NOTE — Patient Instructions (Signed)
Start metoprolol 25mg once a day at bedtime 

## 2018-04-29 ENCOUNTER — Ambulatory Visit (HOSPITAL_COMMUNITY): Payer: Medicare Other | Attending: Cardiology

## 2018-04-29 ENCOUNTER — Other Ambulatory Visit: Payer: Self-pay

## 2018-04-29 ENCOUNTER — Telehealth: Payer: Self-pay

## 2018-04-29 DIAGNOSIS — I472 Ventricular tachycardia, unspecified: Secondary | ICD-10-CM

## 2018-04-29 DIAGNOSIS — E119 Type 2 diabetes mellitus without complications: Secondary | ICD-10-CM | POA: Diagnosis not present

## 2018-04-29 DIAGNOSIS — Z6829 Body mass index (BMI) 29.0-29.9, adult: Secondary | ICD-10-CM | POA: Insufficient documentation

## 2018-04-29 DIAGNOSIS — I480 Paroxysmal atrial fibrillation: Secondary | ICD-10-CM

## 2018-04-29 DIAGNOSIS — I10 Essential (primary) hypertension: Secondary | ICD-10-CM | POA: Diagnosis present

## 2018-04-29 DIAGNOSIS — I4892 Unspecified atrial flutter: Secondary | ICD-10-CM | POA: Diagnosis not present

## 2018-04-29 DIAGNOSIS — E669 Obesity, unspecified: Secondary | ICD-10-CM | POA: Diagnosis not present

## 2018-04-29 DIAGNOSIS — I4891 Unspecified atrial fibrillation: Secondary | ICD-10-CM | POA: Insufficient documentation

## 2018-04-29 MED ORDER — METOPROLOL SUCCINATE ER 50 MG PO TB24: 25.0000 mg | ORAL_TABLET | Freq: Every day | ORAL | 3 refills | Status: DC

## 2018-04-29 NOTE — Progress Notes (Addendum)
Primary Care Physician: Mariel Sleet Referring Physician: Dr. Oval Linsey   Lisa Cole is a 37 y.o. female with a h/o h/o asthma, SVT, DM, HTN, that is in the afib clinic.Marland Kitchen Pt has h/o SVT and had noted more palpitations/chest pain when seen by Dr. Oval Linsey, monitor placed. She is in SR today.   Today, she denies symptoms of palpitations, chest pain, shortness of breath, orthopnea, PND, lower extremity edema, dizziness, presyncope, syncope, or neurologic sequela. The patient is tolerating medications without difficulties and is otherwise without complaint today.   Past Medical History:  Diagnosis Date  . Anemia   . Anxiety disorder   . Arthritis   . Asthma   . Atypical chest pain 04/10/2018  . BV (bacterial vaginosis)   . Chronic pelvic pain in female   . Diabetes mellitus   . Endometriosis   . Hashimoto thyroiditis   . Hepatic steatosis   . Hypertension   . IBS (irritable bowel syndrome)   . IC (interstitial cystitis)   . Obesity   . OSA (obstructive sleep apnea)   . SVT (supraventricular tachycardia) ([place])   . Type 2 diabetes mellitus ([place])    Past Surgical History:  Procedure Laterality Date  . bladder staple    . CYSTOSTOMY W/ BLADDER BIOPSY    . SHOULDER SURGERY  2008   right  . TOTAL ABDOMINAL HYSTERECTOMY  2004  . TUBAL LIGATION  2001  . WRIST SURGERY  2008   right    Current Outpatient Medications  Medication Sig Dispense Refill  . albuterol (PROVENTIL HFA;VENTOLIN HFA) 108 (90 Base) MCG/ACT inhaler Inhale 2 puffs into the lungs every 4 (four) hours as needed for wheezing or shortness of breath.     . empagliflozin (JARDIANCE) 10 MG TABS tablet Take 10 mg by mouth daily. 30 tablet 11  . glucose blood (ONETOUCH VERIO) test strip Use as instructed to check sugar 2 times daily. 200 each 3  . insulin aspart (NOVOLOG FLEXPEN) 100 UNIT/ML FlexPen Inject 10-15 Units into the skin 3 (three) times daily before meals. 10-15 min before meals 15 mL 3  .  insulin lispro (HUMALOG KWIKPEN) 100 UNIT/ML KiwkPen Inject under skin 15-20 units 3 times daily before meals. 15 pen 3  . Lancet Devices (ONE TOUCH DELICA LANCING DEV) MISC Use to check sugar 2 times daily. 200 each 3  . LANTUS SOLOSTAR 100 UNIT/ML Solostar Pen inject 40 units subcutaneously at bedtime (Patient taking differently: units40 subcutaneously at bedtime) 15 mL 0  . omeprazole (PRILOSEC) 20 MG capsule Take 1 capsule by mouth daily.    . budesonide-formoterol (SYMBICORT) 160-4.5 MCG/ACT inhaler Inhale 2 puffs into the lungs 2 (two) times daily. (Patient not taking: Reported on 04/28/2018) 1 Inhaler 6  . gabapentin (NEURONTIN) 300 MG capsule Take I capsule qd for 1 week, then take 1 capsule bid for 1 week, then take 1 capsule tid    . lisinopril (PRINIVIL,ZESTRIL) 5 MG tablet Take 5 mg by mouth daily. Reported on 03/04/2016  0  . metoprolol succinate (TOPROL XL) 25 MG 24 hr tablet Take 1 tablet (25 mg total) by mouth at bedtime. 30 tablet 3   No current facility-administered medications for this encounter.     Allergies  Allergen Reactions  . Liraglutide Hives, Itching and Other (See Comments)    "Burning sensation" "Burning sensation"  . Codeine Nausea Only  . Acetaminophen Other (See Comments)    Advised not to take due to peptic ulcer  .  Ibuprofen Other (See Comments)    Advised not to take due to peptic ulcer  . Amoxicillin Swelling and Rash    Has patient had a PCN reaction causing immediate rash, facial/tongue/throat swelling, SOB or lightheadedness with hypotension:yes Has patient had a PCN reaction causing severe rash involving mucus membranes or skin necrosis: no Has patient had a PCN reaction that required hospitalization: yes Has patient had a PCN reaction occurring within the last 10 years: no If all of the above answers are "NO", then may proceed with Cephalosporin use.   . Metformin Diarrhea    "GI upset even with Metformin XR"  . Penicillins Swelling and Rash     Has patient had a PCN reaction causing immediate rash, facial/tongue/throat swelling, SOB or lightheadedness with hypotension: Yes Has patient had a PCN reaction causing severe rash involving mucus membranes or skin necrosis: No Has patient had a PCN reaction that required hospitalization Yes Has patient had a PCN reaction occurring within the last 10 years: No If all of the above answers are "NO", then may proceed with Cephalosporin use.     Social History   Socioeconomic History  . Marital status: Divorced    Spouse name: Not on file  . Number of children: Not on file  . Years of education: Not on file  . Highest education level: Not on file  Occupational History  . Occupation: Product manager: [employer]  . Financial resource strain: Not on file  . Food insecurity:    Worry: Not on file    Inability: Not on file  . Transportation needs:    Medical: Not on file    Non-medical: Not on file  Tobacco Use  . Smoking status: Never Smoker  . Smokeless tobacco: Never Used  Substance and Sexual Activity  . Alcohol use: No  . Drug use: No  . Sexual activity: Not Currently  Lifestyle  . Physical activity:    Days per week: Not on file    Minutes per session: Not on file  . Stress: Not on file  Relationships  . Social connections:    Talks on phone: Not on file    Gets together: Not on file    Attends religious service: Not on file    Active member of club or organization: Not on file    Attends meetings of clubs or organizations: Not on file    Relationship status: Not on file  . Intimate partner violence:    Fear of current or ex partner: Not on file    Emotionally abused: Not on file    Physically abused: Not on file    Forced sexual activity: Not on file  Other Topics Concern  . Not on file  Social History Narrative   Exercise: yes, working out w/ Physiological scientist   Diet: incorporated more fruits and vegetables     Family History    Problem Relation Age of Onset  . Diabetes Mother   . Hypertension Father   . Heart attack Father   . Diabetes Father   . Alcohol abuse Unknown   . Hypertension Unknown   . Hyperlipidemia Unknown   . Kidney disease Unknown   . Colon cancer Unknown        uncle  . Prostate cancer Unknown        uncles and grandfather  . Cirrhosis Unknown        both grandmother and grandfather  . Lung  cancer Unknown   . Heart disease Unknown   . Thyroid disease Unknown   . Colon polyps Unknown   . Liver disease Unknown   . Arthritis Maternal Grandmother   . Heart failure Maternal Grandmother   . Kidney failure Maternal Grandmother   . Diabetes Daughter        type 2  . Hypertension Daughter   . Psoriasis Sister   . Arthritis Sister   . Lung cancer Maternal Grandfather   . Heart disease Maternal Grandfather   . Diabetes Paternal Grandmother   . Glaucoma Paternal Grandmother   . Hypertension Paternal Grandmother   . Heart failure Paternal Grandmother   . Diabetes Paternal Grandfather   . Hypertension Paternal Grandfather     ROS- All systems are reviewed and negative except as per the HPI above  Physical Exam: Vitals:   04/28/18 1029  BP: 130/82  Pulse: 90  Weight: 149 lb (67.6 kg)  Height: 5' (1.524 m)   Wt Readings from Last 3 Encounters:  04/28/18 149 lb (67.6 kg)  04/10/18 151 lb (68.5 kg)  03/13/18 152 lb 3.2 oz (69 kg)    Labs: Lab Results  Component Value Date   NA 143 04/10/2018   K 4.4 04/10/2018   CL 102 04/10/2018   CO2 23 04/10/2018   GLUCOSE 287 (H) 04/10/2018   BUN 10 04/10/2018   CREATININE 0.74 04/10/2018   CALCIUM 10.0 04/10/2018   MG 2.0 04/10/2018   No results found for: INR Lab Results  Component Value Date   CHOL 141 04/10/2018   HDL 38 (L) 04/10/2018   LDLCALC 78 04/10/2018   TRIG 127 04/10/2018     GEN- The patient is well appearing, alert and oriented x 3 today.   Head- normocephalic, atraumatic Eyes-  Sclera clear, conjunctiva  pink Ears- hearing intact Oropharynx- clear Neck- supple, no JVP Lymph- no cervical lymphadenopathy Lungs- Clear to ausculation bilaterally, normal work of breathing Heart- Regular rate and rhythm, no murmurs, rubs or gallops, PMI not laterally displaced GI- soft, NT, ND, + BS Extremities- no clubbing, cyanosis, or edema MS- no significant deformity or atrophy Skin- no rash or lesion Psych- euthymic mood, full affect Neuro- strength and sensation are intact  EKG- NSR at 90 bpm, PR int 128 ms, qrs int 80 ms, qtcf 425 ms EPIC records reviewed    Assessment and Plan: 1. Palpitations/SVTWill start metoprolol succinate 25 mg at hs Pending Cardiac CT 8/27 and sleep study 8/16 as previously ordered by Dr. Oval Linsey  2.   3. HTN  H/o of htn but with normal BP today not on medication, pt states she never started lisinopril   Addendum: 8/8-Received phone call from [facility] in the device clinic, letting me know that pt's monitor was confused with another pt and she did not have atrial flutter. She did have a run of SVT. She is seeing Dr. Oval Linsey this am to address . BB that I started is still appropriate for SVT. Will cancel f/u here as she saw Dr. Oval Linsey this am. Discussed with my manager. She advised a safety zone report and will look at charges  related to this unneeded visit.   Geroge Baseman Jesselle Laflamme, [facility] [address] [place], [place] [address] [phone]

## 2018-04-29 NOTE — Telephone Encounter (Signed)
Called pt today regarding a monitor report which showed 12 beat run of V Tach. Pt states she got up to use the bathroom around this time and felt really bad until about 5:30am today. Dr Curt Bears reviewed her monitor strips and advised her to increase her Metoprolol Succinate to 50mg  qd and to have an echo completed. Pt is to keep her follow up with Roderic Palau in 2 weeks.   Pt agrees with plan and will call the office if she continues to have symptoms. Monitor strips have been scanned it. Will forward this encounter to Dr Oval Linsey for review.

## 2018-04-30 ENCOUNTER — Telehealth: Payer: Self-pay | Admitting: Cardiovascular Disease

## 2018-04-30 NOTE — Telephone Encounter (Signed)
Returning call to patient to discuss monitor results.  Patient verbalized concern about resulting, Probation officer explained she had received incorrect results and physician or nurse will contact her regarding correct results and treatment plan if needed.

## 2018-04-30 NOTE — Telephone Encounter (Signed)
Follow up:  Patient calling back concerning her monitor . Please call

## 2018-04-30 NOTE — Telephone Encounter (Signed)
New problem    Pt calling back to discuss her monitor with nurse. Please call pt.

## 2018-04-30 NOTE — Telephone Encounter (Signed)
Spoke with patient and she has follow up appointment in am. She was started on Metoprolol and per Dr Oval Linsey this is appropriate after correct reviewing strips. Advised patient, verbalized understanding.

## 2018-05-01 ENCOUNTER — Ambulatory Visit (INDEPENDENT_AMBULATORY_CARE_PROVIDER_SITE_OTHER): Payer: Medicare Other | Admitting: Cardiovascular Disease

## 2018-05-01 ENCOUNTER — Encounter: Payer: Self-pay | Admitting: *Deleted

## 2018-05-01 ENCOUNTER — Encounter: Payer: Self-pay | Admitting: Cardiovascular Disease

## 2018-05-01 ENCOUNTER — Ambulatory Visit (INDEPENDENT_AMBULATORY_CARE_PROVIDER_SITE_OTHER): Payer: Medicare Other

## 2018-05-01 VITALS — BP 112/84 | HR 80 | Ht 60.0 in | Wt 149.8 lb

## 2018-05-01 DIAGNOSIS — R079 Chest pain, unspecified: Secondary | ICD-10-CM | POA: Diagnosis not present

## 2018-05-01 DIAGNOSIS — I495 Sick sinus syndrome: Secondary | ICD-10-CM

## 2018-05-01 DIAGNOSIS — R002 Palpitations: Secondary | ICD-10-CM

## 2018-05-01 NOTE — Progress Notes (Signed)
Patient ID: Lisa Cole, female   DOB: [birth year], 37 y.o.   MRN: [id number] After discussion with patient, [employer] representative Evelena Asa was contacted to cancel cardiac event monitor with [employer] and request any associated charges be cancelled.Patient was enrolled with [employer] for a cardiac event monitor. Patient was seen at the [facility] today to apply the monitor and provide instructions.

## 2018-05-01 NOTE — Addendum Note (Signed)
Encounter addended by: Sherran Needs, NP on: 05/01/2018 3:26 PM  Actions taken: Chief Complaint modified, Visit diagnoses modified, Sign clinical note

## 2018-05-01 NOTE — Progress Notes (Signed)
Cardiology Office Note   Date:  05/01/2018   ID:  Lisa Shirlee, Cole 1981-03-09, MRN [id number]  PCP:  Elisabeth Cara, PA-C  Cardiologist:   Skeet Latch, MD   No chief complaint on file.    History of Present Illness: Lisa Cole is a 37 y.o. female with asthma, diabetes, obstructive sleep apnea, obesity, and SVT here for follow up.  She was initially seen 04/10/18 for the evaluation of chest pain and palpitations.  [name] she reports being diagnosed with sinus tachycardia at age 37.  She was initially treated with metoprolol and felt better.  However this was discontinued when she became pregnant at age 4.  That pregnancy was complicated by gestational diabetes and she is had diabetes ever since that time.  Her diabetes has not been well-controlled.  She has a long-standing history of chest pain and palpitations.  [name] was seen in the ED 11/2017 with chest pain and palpitations.  This occurred in the setting of being treated for an upper respiratory infection.  Cardiac enzymes and d-dimer were negative.  She was noted to have chest wall tenderness on exam.  EKG was without acute changes.  She describes it as feeling like a toothache.  It is worse when she gets upset or when she drinks ice cold water.  She recently started by exercising with Zumba and hip hop dance without exertional chest pain or shortness of breath.  She also reports frequent episodes of dizziness followed by a drop in her blood pressure.  This occurs less than once per month.  She is unable to recall if it happens more with changes in position or exertion.  She also reports heart racing that is most prominent at night.  She denies syncope.  She has a diagnosis of OSA but hasn't used her CPAP in years.  Her father had an MI in his early 38s.   [name] was referred for a 14 day event monitor.  Her monitor was inadvertantly switched with another patient and there was concern for atrial  fibrillation so she was referred to the atrial fibrillation clinic and had an echo 04/29/18 that revealed LVEF 60 to 65% with normal diastolic function.  Her actual monitor was notable for SVT and she is still wearing it.  After being seen in the atrial for ablation clinic she started metoprolol 25 mg daily.  She continued to have episodes of heart racing but it was better.  She was instructed to increase the dose to 50 mg.  However due to concern about whether her monitor findings were actually hers this dose was never increased.  She has not had any lower extremity edema, orthopnea, or PND.  She notes that she has been under a lot of stress lately.  She was supposed to graduate from a Master's program but had to postpone her classes.  She was also recently evicted from her home and is living with her mom.  Past Medical History:  Diagnosis Date  . Anemia   . Anxiety disorder   . Arthritis   . Asthma   . Atypical chest pain 04/10/2018  . BV (bacterial vaginosis)   . Chronic pelvic pain in female   . Diabetes mellitus   . Endometriosis   . Hashimoto thyroiditis   . Hepatic steatosis   . Hypertension   . IBS (irritable bowel syndrome)   . IC (interstitial cystitis)   . Obesity   . OSA (obstructive sleep  apnea)   . SVT (supraventricular tachycardia) ([place])   . Type 2 diabetes mellitus ([place])     Past Surgical History:  Procedure Laterality Date  . bladder staple    . CYSTOSTOMY W/ BLADDER BIOPSY    . SHOULDER SURGERY  2008   right  . TOTAL ABDOMINAL HYSTERECTOMY  2004  . TUBAL LIGATION  2001  . WRIST SURGERY  2008   right     Current Outpatient Medications  Medication Sig Dispense Refill  . albuterol (PROVENTIL HFA;VENTOLIN HFA) 108 (90 Base) MCG/ACT inhaler Inhale 2 puffs into the lungs every 4 (four) hours as needed for wheezing or shortness of breath.     . budesonide-formoterol (SYMBICORT) 160-4.5 MCG/ACT inhaler Inhale 2 puffs into the lungs 2 (two) times daily. (Patient not  taking: Reported on 04/28/2018) 1 Inhaler 6  . empagliflozin (JARDIANCE) 10 MG TABS tablet Take 10 mg by mouth daily. 30 tablet 11  . gabapentin (NEURONTIN) 300 MG capsule Take I capsule qd for 1 week, then take 1 capsule bid for 1 week, then take 1 capsule tid    . glucose blood (ONETOUCH VERIO) test strip Use as instructed to check sugar 2 times daily. 200 each 3  . insulin aspart (NOVOLOG FLEXPEN) 100 UNIT/ML FlexPen Inject 10-15 Units into the skin 3 (three) times daily before meals. 10-15 min before meals 15 mL 3  . insulin lispro (HUMALOG KWIKPEN) 100 UNIT/ML KiwkPen Inject under skin 15-20 units 3 times daily before meals. 15 pen 3  . Lancet Devices (ONE TOUCH DELICA LANCING DEV) MISC Use to check sugar 2 times daily. 200 each 3  . LANTUS SOLOSTAR 100 UNIT/ML Solostar Pen inject 40 units subcutaneously at bedtime (Patient taking differently: units40 subcutaneously at bedtime) 15 mL 0  . omeprazole (PRILOSEC) 20 MG capsule Take 1 capsule by mouth daily.     No current facility-administered medications for this visit.     Allergies:   Liraglutide; Codeine; Acetaminophen; Ibuprofen; Amoxicillin; Metformin; and Penicillins    Social History:  The patient  reports that she has never smoked. She has never used smokeless tobacco. She reports that she does not drink alcohol or use drugs.   Family History:  The patient's family history includes Alcohol abuse in her unknown relative; Arthritis in her maternal grandmother and sister; Cirrhosis in her unknown relative; Colon cancer in her unknown relative; Colon polyps in her unknown relative; Diabetes in her daughter, father, mother, paternal grandfather, and paternal grandmother; Glaucoma in her paternal grandmother; Heart attack in her father; Heart disease in her maternal grandfather and unknown relative; Heart failure in her maternal grandmother and paternal grandmother; Hyperlipidemia in her unknown relative; Hypertension in her daughter, father,  paternal grandfather, paternal grandmother, and unknown relative; Kidney disease in her unknown relative; Kidney failure in her maternal grandmother; Liver disease in her unknown relative; Lung cancer in her maternal grandfather and unknown relative; Prostate cancer in her unknown relative; Psoriasis in her sister; Thyroid disease in her unknown relative.    ROS:  Please see the history of present illness.   Otherwise, review of systems are positive for insomnia, leg pain.   All other systems are reviewed and negative.    PHYSICAL EXAM: VS:  BP 112/84   Pulse 80   Ht 5' (1.524 m)   Wt 149 lb 12.8 oz (67.9 kg)   BMI 29.26 kg/m  , BMI Body mass index is 29.26 kg/m. GENERAL:  Well appearing HEENT: Pupils equal round and reactive, fundi  not visualized, oral mucosa unremarkable NECK:  No jugular venous distention, waveform within normal limits, carotid upstroke brisk and symmetric, no bruits LUNGS:  Clear to auscultation bilaterally HEART:  RRR.  PMI not displaced or sustained,S1 and S2 within normal limits, no S3, no S4, no clicks, no rubs, no murmurs ABD:  Flat, positive bowel sounds normal in frequency in pitch, no bruits, no rebound, no guarding, no midline pulsatile mass, no hepatomegaly, no splenomegaly EXT:  2 plus pulses throughout, no edema, no cyanosis no clubbing SKIN:  No rashes no nodules NEURO:  Cranial nerves II through XII grossly intact, motor grossly intact throughout PSYCH:  Cognitively intact, oriented to person place and time    EKG:  EKG is ordered today. 05/02/18: Sinus rhythm.  Rate 80 bpm.   Echo 04/29/2018: Study Conclusions  - Left ventricle: The cavity size was normal. Systolic function was   normal. The estimated ejection fraction was in the range of 60%   to 65%. Wall motion was normal; there were no regional wall   motion abnormalities. Left ventricular diastolic function   parameters were normal. - Aortic valve: Transvalvular velocity was within the  normal range.   There was no stenosis. There was no significant regurgitation. - Mitral valve: Transvalvular velocity was within the normal range.   There was no evidence for stenosis. There was no significant   regurgitation. - Atrial septum: No defect or patent foramen ovale was identified. - Tricuspid valve: There was no significant regurgitation. - Pulmonic valve: There was no significant regurgitation.  Impressions:  - Normal LV systolic and diastolic function. No significant   valvular abnormalities.  Recent Labs: 04/10/2018: BUN 10; Creatinine, Ser 0.74; Hemoglobin 13.9; Magnesium 2.0; Platelets 206; Potassium 4.4; Sodium 143; TSH 0.822    Lipid Panel    Component Value Date/Time   CHOL 141 04/10/2018 0935   TRIG 127 04/10/2018 0935   HDL 38 (L) 04/10/2018 0935   CHOLHDL 3.7 04/10/2018 0935   CHOLHDL 3 02/16/2010 1556   VLDL 20.0 02/16/2010 1556   LDLCALC 78 04/10/2018 0935      Wt Readings from Last 3 Encounters:  05/01/18 149 lb 12.8 oz (67.9 kg)  04/28/18 149 lb (67.6 kg)  04/10/18 151 lb (68.5 kg)      ASSESSMENT AND PLAN:  # Atypical chest pain:  No exertional symptoms.  CT-A is pending.  Echo was unremarkable.  I suspect it is musculoskeletal.   # Palpitations: SVT vs. Sinus tachycardia on monitor, though these monitors were mixed up and it is unclear which is hers.  We will replace with a 7 day event monitor.  If she does truly have episodes of SVT or sinus tachycardia we will start metoprolol 50 mg daily.  # OSA: She has known sleep apnea that is untreated. Sleep study pending.  Current medicines are reviewed at length with the patient today.  The patient does not have concerns regarding medicines.  The following changes have been made:  no change  Labs/ tests ordered today include:   Orders Placed This Encounter  Procedures  . EKG 12-Lead     Disposition:   FU with Tylique Aull C. Oval Linsey, MD, [facility] in 1 month.     Signed, Gurkirat Basher C.  Oval Linsey, MD, [facility]  05/01/2018 11:22 AM    [place]

## 2018-05-01 NOTE — Addendum Note (Signed)
Encounter addended by: Sherran Needs, NP on: 05/01/2018 9:15 AM  Actions taken: Sign clinical note

## 2018-05-01 NOTE — Patient Instructions (Signed)
Medication Instructions:  STAY OFF THE LISINOPRIL AND METOPROLOL   Labwork: NONE  Testing/Procedures: 7 DAY EVENT   Follow-Up: KEEP FOLLOW UP AS SCHEDULED   If you need a refill on your cardiac medications before your next appointment, please call your pharmacy.

## 2018-05-01 NOTE — Telephone Encounter (Signed)
Addendum: This pt was called in response to a monitor report given to nursing triage from [employer]. Initial call was matched with pt name and DOB which matched the [employer] monitor report. Information on the report was inaccurate, per [employer] and nursing management. The report sent to nursing triage was incorrectly labeled by [employer]. Patient is aware per nursing management.

## 2018-05-02 ENCOUNTER — Telehealth: Payer: Self-pay

## 2018-05-02 NOTE — Telephone Encounter (Signed)
Spoke to Lisa Cole that said she is interested in participating in the PREP.  I will call her closer to Sept.to make an intake appointment to start a new class in mid-Sept.

## 2018-05-05 ENCOUNTER — Telehealth: Payer: Self-pay | Admitting: Cardiovascular Disease

## 2018-05-05 NOTE — Telephone Encounter (Signed)
Returned call to patient. She states she wanted to know if her monitor picked up anything since she is not wearing it b/c it is charging. She is not sure charging well.   Notified that that no serious/urgent fax messages have been received thus far but I do not have access to the monitor company website to see anything real time.   Will route to monitor team to notify about issues with charging

## 2018-05-05 NOTE — Telephone Encounter (Signed)
New Message   Pt states she is not wearing her heart monitor right because it is charging and wants to let the nurse know. Also wants to know if anything was pick up from the monitor

## 2018-05-05 NOTE — Telephone Encounter (Signed)
Patient did not think sensor charger was working correctly.  [employer] representative, Suzzette Righter, was contacted to have a new sensor charger shipped to the patient.  Ms. Tolsma was contacted to inform a new sensor charger will be shipped.

## 2018-05-07 ENCOUNTER — Other Ambulatory Visit: Payer: Self-pay | Admitting: Internal Medicine

## 2018-05-09 ENCOUNTER — Encounter (HOSPITAL_BASED_OUTPATIENT_CLINIC_OR_DEPARTMENT_OTHER): Payer: Self-pay

## 2018-05-12 ENCOUNTER — Ambulatory Visit (HOSPITAL_COMMUNITY): Payer: Self-pay | Admitting: Nurse Practitioner

## 2018-05-12 ENCOUNTER — Ambulatory Visit: Payer: Medicare Other | Admitting: Psychology

## 2018-05-14 ENCOUNTER — Other Ambulatory Visit: Payer: Self-pay | Admitting: *Deleted

## 2018-05-14 DIAGNOSIS — Z5181 Encounter for therapeutic drug level monitoring: Secondary | ICD-10-CM

## 2018-05-14 DIAGNOSIS — Z01812 Encounter for preprocedural laboratory examination: Secondary | ICD-10-CM

## 2018-05-20 ENCOUNTER — Ambulatory Visit (HOSPITAL_COMMUNITY): Payer: Medicare Other

## 2018-06-05 ENCOUNTER — Ambulatory Visit (INDEPENDENT_AMBULATORY_CARE_PROVIDER_SITE_OTHER): Payer: Medicare Other | Admitting: Psychology

## 2018-06-05 DIAGNOSIS — F411 Generalized anxiety disorder: Secondary | ICD-10-CM

## 2018-06-05 DIAGNOSIS — F331 Major depressive disorder, recurrent, moderate: Secondary | ICD-10-CM | POA: Diagnosis not present

## 2018-06-06 ENCOUNTER — Telehealth: Payer: Self-pay | Admitting: Internal Medicine

## 2018-06-06 NOTE — Telephone Encounter (Signed)
Please advise on below  

## 2018-06-06 NOTE — Telephone Encounter (Signed)
Patient stated her Orthopedic is wanting her to have surgery . She wanted to check to make sure that Dr Cruzita Lederer would be okay with her having the anesthesia. If Dr Cruzita Lederer is fine with this she would need to write a letter of approval for the patient.  Please advise

## 2018-06-06 NOTE — Telephone Encounter (Signed)
Pt stated that she does not believe it is down and that she will discuss further at appointment with you on the 20th.

## 2018-06-06 NOTE — Telephone Encounter (Signed)
She has an appointment in few days.  I need to recheck her HbA1c at that time and decide then.  Her previous HbA1c was very high, above 12, so for now, I cannot approve her for surgery.  However, if her HbA1c is better now, then I can definitely do that

## 2018-06-07 ENCOUNTER — Ambulatory Visit (HOSPITAL_BASED_OUTPATIENT_CLINIC_OR_DEPARTMENT_OTHER): Payer: Medicare Other | Attending: Cardiovascular Disease | Admitting: Cardiovascular Disease

## 2018-06-07 VITALS — Ht 60.0 in | Wt 165.0 lb

## 2018-06-07 DIAGNOSIS — R0683 Snoring: Secondary | ICD-10-CM | POA: Diagnosis not present

## 2018-06-07 DIAGNOSIS — R079 Chest pain, unspecified: Secondary | ICD-10-CM | POA: Diagnosis present

## 2018-06-07 DIAGNOSIS — G4730 Sleep apnea, unspecified: Secondary | ICD-10-CM | POA: Diagnosis not present

## 2018-06-07 DIAGNOSIS — G4733 Obstructive sleep apnea (adult) (pediatric): Secondary | ICD-10-CM | POA: Insufficient documentation

## 2018-06-07 DIAGNOSIS — R0681 Apnea, not elsewhere classified: Secondary | ICD-10-CM

## 2018-06-07 DIAGNOSIS — R002 Palpitations: Secondary | ICD-10-CM | POA: Diagnosis present

## 2018-06-12 ENCOUNTER — Ambulatory Visit: Payer: Self-pay | Admitting: Cardiovascular Disease

## 2018-06-12 ENCOUNTER — Ambulatory Visit (INDEPENDENT_AMBULATORY_CARE_PROVIDER_SITE_OTHER): Payer: Medicare Other | Admitting: Psychology

## 2018-06-12 ENCOUNTER — Other Ambulatory Visit: Payer: Self-pay | Admitting: *Deleted

## 2018-06-12 DIAGNOSIS — F411 Generalized anxiety disorder: Secondary | ICD-10-CM | POA: Diagnosis not present

## 2018-06-12 DIAGNOSIS — F331 Major depressive disorder, recurrent, moderate: Secondary | ICD-10-CM | POA: Diagnosis not present

## 2018-06-12 DIAGNOSIS — Z01812 Encounter for preprocedural laboratory examination: Secondary | ICD-10-CM

## 2018-06-12 DIAGNOSIS — R0789 Other chest pain: Secondary | ICD-10-CM

## 2018-06-13 ENCOUNTER — Ambulatory Visit (HOSPITAL_COMMUNITY)
Admission: RE | Admit: 2018-06-13 | Discharge: 2018-06-13 | Disposition: A | Discharge status: Home / Self Care | Payer: Medicare Other | Source: Ambulatory Visit | Attending: Cardiovascular Disease | Admitting: Cardiovascular Disease

## 2018-06-13 ENCOUNTER — Ambulatory Visit (HOSPITAL_COMMUNITY): Admission: RE | Admit: 2018-06-13 | Payer: Medicare Other | Source: Ambulatory Visit

## 2018-06-13 ENCOUNTER — Encounter (HOSPITAL_COMMUNITY): Payer: Self-pay

## 2018-06-13 ENCOUNTER — Ambulatory Visit (INDEPENDENT_AMBULATORY_CARE_PROVIDER_SITE_OTHER): Payer: Medicare Other | Admitting: Internal Medicine

## 2018-06-13 ENCOUNTER — Other Ambulatory Visit: Payer: Self-pay | Admitting: Internal Medicine

## 2018-06-13 ENCOUNTER — Encounter: Payer: Self-pay | Admitting: Internal Medicine

## 2018-06-13 VITALS — BP 110/60 | HR 94 | Ht 60.0 in | Wt 148.0 lb

## 2018-06-13 DIAGNOSIS — E1165 Type 2 diabetes mellitus with hyperglycemia: Secondary | ICD-10-CM

## 2018-06-13 DIAGNOSIS — E063 Autoimmune thyroiditis: Secondary | ICD-10-CM | POA: Diagnosis not present

## 2018-06-13 DIAGNOSIS — R002 Palpitations: Secondary | ICD-10-CM | POA: Insufficient documentation

## 2018-06-13 DIAGNOSIS — R0789 Other chest pain: Secondary | ICD-10-CM | POA: Diagnosis not present

## 2018-06-13 DIAGNOSIS — Z8249 Family history of ischemic heart disease and other diseases of the circulatory system: Secondary | ICD-10-CM | POA: Diagnosis not present

## 2018-06-13 DIAGNOSIS — Z8639 Personal history of other endocrine, nutritional and metabolic disease: Secondary | ICD-10-CM

## 2018-06-13 DIAGNOSIS — R072 Precordial pain: Secondary | ICD-10-CM | POA: Diagnosis not present

## 2018-06-13 DIAGNOSIS — R079 Chest pain, unspecified: Secondary | ICD-10-CM | POA: Diagnosis present

## 2018-06-13 DIAGNOSIS — Z23 Encounter for immunization: Secondary | ICD-10-CM | POA: Diagnosis not present

## 2018-06-13 DIAGNOSIS — Z794 Long term (current) use of insulin: Secondary | ICD-10-CM | POA: Diagnosis not present

## 2018-06-13 DIAGNOSIS — E119 Type 2 diabetes mellitus without complications: Secondary | ICD-10-CM | POA: Diagnosis not present

## 2018-06-13 LAB — BASIC METABOLIC PANEL WITH GFR
BUN: 11 mg/dL (ref 7–25)
CHLORIDE: 102 mmol/L (ref 98–110)
CO2: 28 mmol/L (ref 20–32)
Calcium: 9.9 mg/dL (ref 8.6–10.2)
Creat: 0.67 mg/dL (ref 0.50–1.10)
GFR, EST AFRICAN AMERICAN: 129 mL/min/{1.73_m2} (ref >=60)
GFR, Est Non African American: 112 mL/min/{1.73_m2} (ref >=60)
GLUCOSE: 373 mg/dL — AB (ref 65–99)
POTASSIUM: 4.7 mmol/L (ref 3.5–5.3)
SODIUM: 138 mmol/L (ref 135–146)

## 2018-06-13 LAB — TSH: TSH: 1.19 u[IU]/mL (ref 0.35–4.50)

## 2018-06-13 LAB — POCT GLYCOSYLATED HEMOGLOBIN (HGB A1C): Hemoglobin A1C: 12.8 % — AB (ref 4.0–5.6)

## 2018-06-13 LAB — T4, FREE: Free T4: 1.05 ng/dL (ref 0.60–1.60)

## 2018-06-13 LAB — T3, FREE: T3, Free: 4.4 pg/mL — ABNORMAL HIGH (ref 2.3–4.2)

## 2018-06-13 MED ORDER — METOPROLOL TARTRATE 5 MG/5ML IV SOLN
10.0000 mg | Freq: Once | INTRAVENOUS | Status: AC
  Administered 2018-06-13: 10 mg via INTRAVENOUS
  Filled 2018-06-13: qty 10

## 2018-06-13 MED ORDER — METOPROLOL TARTRATE 5 MG/5ML IV SOLN
INTRAVENOUS | Status: AC
  Filled 2018-06-13: qty 10

## 2018-06-13 MED ORDER — EMPAGLIFLOZIN 10 MG PO TABS
10.0000 mg | ORAL_TABLET | Freq: Every day | ORAL | 3 refills | Status: DC
Start: 2018-06-13 — End: 2018-10-16

## 2018-06-13 MED ORDER — INSULIN LISPRO 100 UNIT/ML (KWIKPEN): PEN_INJECTOR | SUBCUTANEOUS | 0 refills | Status: DC

## 2018-06-13 MED ORDER — INSULIN GLARGINE 100 UNIT/ML SOLOSTAR PEN: PEN_INJECTOR | SUBCUTANEOUS | 3 refills | Status: DC

## 2018-06-13 NOTE — Progress Notes (Signed)
Patient ID: Lisa Cole, female   DOB: Apr 20, 1981, 37 y.o.   MRN: [id number]  HPI: Sanyah J Goon is a 37 y.o.-year-old female, referred by Dr. Zigmund Daniel for management of DM2, dx as GDM (at 37 y/o), then DM2  since 2000 insulin-dependent, uncontrolled, with complications: PN). She was previously seeing Dr. Meredith Pel and Dr. Chalmers Cater.  Last visit with me 3 months ago. PCP: Dr Jearld Pies + [facility]  She needs another HbA1c to see if she can be cleared for surgery from her diabetes point of view. Dr. Jodelle Gross.  Since last visit, she was again out of her diabetes medications.  She is also not checking sugars.  Last hemoglobin A1c was: Lab Results  Component Value Date   HGBA1C 12.5 (A) 03/13/2018   HGBA1C 12.5 09/05/2016   HGBA1C 11.3 11/29/2015  11/03/2015: HbA1c 12.2% 06/03/2014: HbA1c 8.5% 11/23/2013: HbA1c 7.9%  Previously on: - Lantus 10 >> 30  >> 40-50 units at bedtime - may forget 1-2x a week; mentions occasional cough and wheezing - She stopped Januvia 100 mg in the past - She stopped Invokana (started by PCP) - mm spasms; this was retried 05/2017 >> thrush, yeast inf - Metformin IR and ER >> N/V/D/"spacing out" - Victoza >> dysphagia and rash - Actos >> no pbs   At last visit: - NovoLog >> Humalog 40-60 units before meals - but itching, wheezing  She was on Lantus 40 units at bedtime >> stopped   Currently on (since last visit): - Jardiance 10 mg before breakfast >> leg cramps  - ran out 1 mo ago!!! - Lantus 40 units at bedtime >> ran out 1 mo ago!!!! - Humalog 15 to 20 units before meals  >> 10 units per meal!  Pt stopped checking sugars - from last visit: - am: 165, 224-315 >> 225-347 >> 329-364 - 2h after b'fast: 259, 304 >> n/c - before lunch: 201-301 >> 285 >> n/c - 2h after lunch: n/c >> 348 >> n/c - before dinner: n/c - 2h after dinner: 315, 328 >> n/c - bedtime: n/c - nighttime: n/c Lowest sugar was 329 >> ?; she has hypoglycemia awareness in the  70s. Highest sugar was 364 >> ? She was in the emergency room with hyperglycemia but without DKA in 02/2016.  Glucometer: AccuChek Aviva  She saw Antonieta Iba with nutrition in the past.  She is off sodas.  -No CKD, last BUN/creatinine:  Lab Results  Component Value Date   BUN 10 04/10/2018   CREATININE 0.74 04/10/2018  11/03/2015: 9/0.7, Glu 315, ACR 4.1 09/07/2015: ACR 15 06/03/2014: ACR <2.2 Not on an ACE inhibitor/ARB.  She is supposed to be on lisinopril.  - + HL; last set of lipids: Lab Results  Component Value Date   CHOL 141 04/10/2018   HDL 38 (L) 04/10/2018   LDLCALC 78 04/10/2018   TRIG 127 04/10/2018   CHOLHDL 3.7 04/10/2018  10/05/2016: 231/283/34/40 11/03/2015: 139/169/33/72 09/07/2015: 143/143/36/78 Not on a statin  - last eye exam was in 09/2014: No DR  -She has numbness and tingling in her feet.  She also has a history of thyroid nodules.  I reviewed the thyroid ultrasound report from 03/24/2014 and there were no worrisome thyroid nodules, only a small 3 mm nodule in the right lobe.  She apparently had another ultrasound the 11/2014, but the report is not available to me.  Latest TSH: Lab Results  Component Value Date   TSH 0.822 04/10/2018  11/03/2015: TSH 0.89 11/03/2015:  TSH 0.89, free T4  0.85 (0.6-1.5)  ROS: Constitutional: + weight loss, + fatigue, no hot flushes, + nocturia Eyes: + blurry vision, no xerophthalmia ENT: no sore throat, no nodules palpated in throat, no dysphagia/odynophagia, no hoarseness Cardiovascular: + CP/+ SOB/+ palpitations/+ leg swelling Respiratory: + Cough/+ SOB Gastrointestinal: + N/no V/D/+ C, + heartburn Musculoskeletal: + muscle aches/+ joint aches Skin: + Rash Neurological: + Tremors/no numbness/tingling/dizziness, + HA   I reviewed pt's medications, allergies, PMH, social hx, family hx, and changes were documented in the history of present illness. Otherwise, unchanged from my initial visit note.  Past Medical  History:  Diagnosis Date  . Anemia   . Anxiety disorder   . Arthritis   . Asthma   . Atypical chest pain 04/10/2018  . BV (bacterial vaginosis)   . Chronic pelvic pain in female   . Diabetes mellitus   . Endometriosis   . Hashimoto thyroiditis   . Hepatic steatosis   . Hypertension   . IBS (irritable bowel syndrome)   . IC (interstitial cystitis)   . Obesity   . OSA (obstructive sleep apnea)   . SVT (supraventricular tachycardia) ([place])   . Type 2 diabetes mellitus ([place])    Past Surgical History:  Procedure Laterality Date  . bladder staple    . CYSTOSTOMY W/ BLADDER BIOPSY    . SHOULDER SURGERY  2008   right  . TOTAL ABDOMINAL HYSTERECTOMY  2004  . TUBAL LIGATION  2001  . WRIST SURGERY  2008   right   Social History   Social History  . Marital Status: Divorced    Spouse Name: N/A  . Number of Children: 3   Occupational History  .  retirement    Social History Main Topics  . Smoking status: Never Smoker   . Smokeless tobacco: Never Used  . Alcohol Use: No  . Drug Use: No   Social History Narrative   Exercise: yes, working out w/ Physiological scientist   Diet: incorporated more fruits and vegetables    Current Outpatient Medications on File Prior to Visit  Medication Sig Dispense Refill  . albuterol (PROVENTIL HFA;VENTOLIN HFA) 108 (90 Base) MCG/ACT inhaler Inhale 2 puffs into the lungs every 4 (four) hours as needed for wheezing or shortness of breath.     . budesonide-formoterol (SYMBICORT) 160-4.5 MCG/ACT inhaler Inhale 2 puffs into the lungs 2 (two) times daily. (Patient not taking: Reported on 04/28/2018) 1 Inhaler 6  . empagliflozin (JARDIANCE) 10 MG TABS tablet Take 10 mg by mouth daily. 30 tablet 11  . gabapentin (NEURONTIN) 300 MG capsule Take I capsule qd for 1 week, then take 1 capsule bid for 1 week, then take 1 capsule tid    . glucose blood (ONETOUCH VERIO) test strip Use as instructed to check sugar 2 times daily. 200 each 3  . insulin aspart (NOVOLOG  FLEXPEN) 100 UNIT/ML FlexPen Inject 10-15 Units into the skin 3 (three) times daily before meals. 10-15 min before meals 15 mL 3  . insulin lispro (HUMALOG KWIKPEN) 100 UNIT/ML KiwkPen INJECT 10 TO 15 UNITS UNDER THE SKIN THREE TIMES DAILY(BEFORE MEALS) 15 mL 0  . Lancet Devices (ONE TOUCH DELICA LANCING DEV) MISC Use to check sugar 2 times daily. 200 each 3  . LANTUS SOLOSTAR 100 UNIT/ML Solostar Pen inject 40 units subcutaneously at bedtime (Patient taking differently: units40 subcutaneously at bedtime) 15 mL 0  . omeprazole (PRILOSEC) 20 MG capsule Take 1 capsule by mouth daily.  No current facility-administered medications on file prior to visit.    Allergies  Allergen Reactions  . Liraglutide Hives, Itching and Other (See Comments)    "Burning sensation" "Burning sensation"  . Codeine Nausea Only  . Acetaminophen Other (See Comments)    Advised not to take due to peptic ulcer  . Ibuprofen Other (See Comments)    Advised not to take due to peptic ulcer  . Amoxicillin Swelling and Rash    Has patient had a PCN reaction causing immediate rash, facial/tongue/throat swelling, SOB or lightheadedness with hypotension:yes Has patient had a PCN reaction causing severe rash involving mucus membranes or skin necrosis: no Has patient had a PCN reaction that required hospitalization: yes Has patient had a PCN reaction occurring within the last 10 years: no If all of the above answers are "NO", then may proceed with Cephalosporin use.   . Metformin Diarrhea    "GI upset even with Metformin XR"  . Penicillins Swelling and Rash    Has patient had a PCN reaction causing immediate rash, facial/tongue/throat swelling, SOB or lightheadedness with hypotension: Yes Has patient had a PCN reaction causing severe rash involving mucus membranes or skin necrosis: No Has patient had a PCN reaction that required hospitalization Yes Has patient had a PCN reaction occurring within the last 10 years: No If  all of the above answers are "NO", then may proceed with Cephalosporin use.    Family History  Problem Relation Age of Onset  . Diabetes Mother   . Hypertension Father   . Heart attack Father   . Diabetes Father   . Alcohol abuse Unknown   . Hypertension Unknown   . Hyperlipidemia Unknown   . Kidney disease Unknown   . Colon cancer Unknown        uncle  . Prostate cancer Unknown        uncles and grandfather  . Cirrhosis Unknown        both grandmother and grandfather  . Lung cancer Unknown   . Heart disease Unknown   . Thyroid disease Unknown   . Colon polyps Unknown   . Liver disease Unknown   . Arthritis Maternal Grandmother   . Heart failure Maternal Grandmother   . Kidney failure Maternal Grandmother   . Diabetes Daughter        type 2  . Hypertension Daughter   . Psoriasis Sister   . Arthritis Sister   . Lung cancer Maternal Grandfather   . Heart disease Maternal Grandfather   . Diabetes Paternal Grandmother   . Glaucoma Paternal Grandmother   . Hypertension Paternal Grandmother   . Heart failure Paternal Grandmother   . Diabetes Paternal Grandfather   . Hypertension Paternal Grandfather    PE: BP 110/60   Pulse 94   Ht 5' (1.524 m)   Wt 148 lb (67.1 kg)   SpO2 97%   BMI 28.90 kg/m  Body mass index is 28.9 kg/m.  Wt Readings from Last 3 Encounters:  06/13/18 148 lb (67.1 kg)  06/07/18 165 lb (74.8 kg)  05/01/18 149 lb 12.8 oz (67.9 kg)   Constitutional: overweight, in NAD Eyes: PERRLA, EOMI, no exophthalmos ENT: moist mucous membranes, no thyromegaly, no cervical lymphadenopathy Cardiovascular: tachycardia, RR, No MRG Respiratory: CTA B Gastrointestinal: abdomen soft, NT, ND, BS+ Musculoskeletal: no deformities, strength intact in all 4 Skin: moist, warm, no rashes Neurological: no tremor with outstretched hands, DTR normal in all 4  ASSESSMENT: 1. DM2, insulin-dependent, uncontrolled, without complications  2.  History of thyroid  nodules  3.  Hashimoto's thyroiditis  PLAN:  1. Patient with long-standing, uncontrolled, type 2 diabetes, usually not compliant with visits and medications.  She continues to have a very poor diet, consisting mostly of fast foods.  We discussed at length in the past about the absolute need to improve her diet.  However, she does not drink sodas anymore. -At last visit, HbA1c was still very high, at 12.5%.  We discussed about the possible consequences of uncontrolled diabetes. -At last visit, she was on rapid acting insulin but she was not taking Lantus.  We restarted Lantus at 40 units, and I explained that this is absolutely needed for good diabetes control for her; we did decrease her Humalog doses while we added Lantus -At last visit, she was telling me that she was taking Invokana sporadically due to fear for yeast infections.  We started Jardiance, which is not as strong.  We also started at a lower dose, of only 10 mg before breakfast.  At this visit, we will recheck a BMP as we started the SGLT2 inhibitor at last visit. -However, at this visit, she again returns being out of her diabetes medicines.  She ran out approximately 1 month ago and apparently her pharmacy did not call her for another refill.  We discussed that this is absolutely unacceptable and she should let me know right away if she has problems obtaining her insulin.  She is also not checking sugars and I strongly advised her to restart.  It is high time that she starts getting more responsible with her diabetes.  She cries in the office today that she would like to get rid of diabetes I advised her that this is not possible especially at this point and for a first step she needs to start taking the medications as prescribed.  She agrees to start doing so.  She wants to return to see me sooner than 3 months.   - We will restart the regimen as prescribed at last visit. - I suggested to:  Patient Instructions  Please restart: -  Jardiance 10 mg before breakfast  - Lantus 40 units at bedtime - Humalog 15 to 20 units before meals  As of now, I cannot clear you for surgery from the diabetes point of view.  Please return in 1.5 months with your sugar log.   - today, HbA1c is 12.8% (higher) - Restart checking sugars at different times of the day - check 3x a day, rotating checks - advised for yearly eye exams >> she is not UTD - Return to clinic in 1.5 mo with sugar log    2.  History of thyroid nodules -Denies neck compression symptoms -Reviewed her latest thyroid ultrasound report: She only has a small 3 mm thyroid nodule.  No follow-up is needed unless she starts developing neck compression symptoms.  3.  Hashimoto's thyroiditis -euthyroid -Not on levothyroxine -Latest TSH was normal in 03/2018.  Office Visit on 06/13/2018  Component Date Value Ref Range Status  . Glucose, Bld 06/13/2018 373* 65 - 99 mg/dL Final   Comment: .            Fasting reference interval . For someone without known diabetes, a glucose value >125 mg/dL indicates that they may have diabetes and this should be confirmed with a follow-up test. .   . BUN 06/13/2018 11  7 - 25 mg/dL Final  . Creat 06/13/2018 0.67  0.50 - 1.10 mg/dL  Final  . GFR, Est Non African American 06/13/2018 112  > OR = 60 mL/min/1.12m2 Final  . GFR, Est African American 06/13/2018 129  > OR = 60 mL/min/1.24m2 Final  . BUN/Creatinine Ratio 86/75/4492 NOT APPLICABLE  6 - 22 (calc) Final  . Sodium 06/13/2018 138  135 - 146 mmol/L Final  . Potassium 06/13/2018 4.7  3.5 - 5.3 mmol/L Final  . Chloride 06/13/2018 102  98 - 110 mmol/L Final  . CO2 06/13/2018 28  20 - 32 mmol/L Final  . Calcium 06/13/2018 9.9  8.6 - 10.2 mg/dL Final  . TSH 06/13/2018 1.19  0.35 - 4.50 uIU/mL Final  . Free T4 06/13/2018 1.05  0.60 - 1.60 ng/dL Final   Comment: Specimens from patients who are undergoing biotin therapy and /or ingesting biotin supplements may contain high levels of  biotin.  The higher biotin concentration in these specimens interferes with this Free T4 assay.  Specimens that contain high levels  of biotin may cause false high results for this Free T4 assay.  Please interpret results in light of the total clinical presentation of the patient.    . T3, Free 06/13/2018 4.4* 2.3 - 4.2 pg/mL Final  . Hemoglobin A1C 06/13/2018 12.8* 4.0 - 5.6 % Final   Labs are normal except high Glu.  [name], MD PhD [facility] Endocrinology

## 2018-06-13 NOTE — Patient Instructions (Addendum)
Please restart: - Jardiance 10 mg before breakfast  - Lantus 40 units at bedtime - Humalog 15 to 20 units before meals  As of now, I cannot clear you for surgery from the diabetes point of view.  Please return in 1.5 months with your sugar log.

## 2018-06-20 ENCOUNTER — Ambulatory Visit (HOSPITAL_COMMUNITY)
Admission: RE | Admit: 2018-06-20 | Discharge: 2018-06-20 | Disposition: A | Discharge status: Home / Self Care | Payer: Medicare Other | Source: Ambulatory Visit | Attending: Cardiovascular Disease | Admitting: Cardiovascular Disease

## 2018-06-20 ENCOUNTER — Ambulatory Visit (HOSPITAL_COMMUNITY): Payer: Medicare Other

## 2018-06-20 ENCOUNTER — Ambulatory Visit: Payer: Medicare Other | Admitting: Cardiovascular Disease

## 2018-06-20 DIAGNOSIS — R079 Chest pain, unspecified: Secondary | ICD-10-CM

## 2018-06-20 DIAGNOSIS — R002 Palpitations: Secondary | ICD-10-CM | POA: Diagnosis not present

## 2018-06-20 DIAGNOSIS — E119 Type 2 diabetes mellitus without complications: Secondary | ICD-10-CM | POA: Insufficient documentation

## 2018-06-20 DIAGNOSIS — R072 Precordial pain: Secondary | ICD-10-CM | POA: Insufficient documentation

## 2018-06-20 DIAGNOSIS — R0789 Other chest pain: Secondary | ICD-10-CM | POA: Insufficient documentation

## 2018-06-20 DIAGNOSIS — Z8249 Family history of ischemic heart disease and other diseases of the circulatory system: Secondary | ICD-10-CM | POA: Insufficient documentation

## 2018-06-20 MED ORDER — NITROGLYCERIN 0.4 MG SL SUBL
SUBLINGUAL_TABLET | SUBLINGUAL | Status: AC
  Filled 2018-06-20: qty 2

## 2018-06-20 MED ORDER — METOPROLOL TARTRATE 5 MG/5ML IV SOLN
INTRAVENOUS | Status: AC
  Filled 2018-06-20: qty 15

## 2018-06-20 MED ORDER — NITROGLYCERIN 0.4 MG SL SUBL
0.4000 mg | SUBLINGUAL_TABLET | Freq: Once | SUBLINGUAL | Status: AC
  Administered 2018-06-20: 0.4 mg via SUBLINGUAL
  Filled 2018-06-20: qty 25

## 2018-06-20 MED ORDER — METOPROLOL TARTRATE 5 MG/5ML IV SOLN
INTRAVENOUS | Status: AC
  Filled 2018-06-20: qty 5

## 2018-06-20 MED ORDER — METOPROLOL TARTRATE 5 MG/5ML IV SOLN
5.0000 mg | INTRAVENOUS | Status: AC | PRN
  Administered 2018-06-20 (×4): 5 mg via INTRAVENOUS
  Filled 2018-06-20 (×4): qty 5

## 2018-06-20 MED ORDER — NITROGLYCERIN 0.4 MG SL SUBL
0.8000 mg | SUBLINGUAL_TABLET | Freq: Once | SUBLINGUAL | Status: DC
  Filled 2018-06-20: qty 25

## 2018-06-20 MED ORDER — IOPAMIDOL (ISOVUE-370) INJECTION 76%
100.0000 mL | Freq: Once | INTRAVENOUS | Status: AC | PRN
  Administered 2018-06-20: 80 mL via INTRAVENOUS

## 2018-06-22 ENCOUNTER — Encounter (HOSPITAL_BASED_OUTPATIENT_CLINIC_OR_DEPARTMENT_OTHER): Payer: Self-pay | Admitting: Cardiovascular Disease

## 2018-06-22 NOTE — Procedures (Signed)
Patient Name: Lisa Cole, Lisa Cole Study Date: 06/07/2018 Gender: Female D.O.B: [birth year] Age (years): 37 Referring Provider: Skeet Latch Height (inches): 21 Interpreting Physician: Shelva Majestic MD, ABSM Weight (lbs): 165 RPSGT: [place], [place] BMI: 32 MRN: [id number] Neck Size: 15.50  CLINICAL INFORMATION Sleep Study Type: NPSG  Indication for sleep study: Diabetes, Fatigue, Non-refreshing Sleep, Snoring, Daytime sleepiness  Epworth Sleepiness Score: 13  Most recent polysomnogram dated 01/29/2016 revealed an AHI of 14.6/h and RDI of 17.7/h. Most recent titration study dated 01/29/2016 was optimal at 11cm H2O with an AHI of 3.7/h.  SLEEP STUDY TECHNIQUE As per the AASM Manual for the Scoring of Sleep and Associated Events v2.3 ([place] 2016) with a hypopnea requiring 4% desaturations.  The channels recorded and monitored were frontal, central and occipital EEG, electrooculogram (EOG), submentalis EMG (chin), nasal and oral airflow, thoracic and abdominal wall motion, anterior tibialis EMG, snore microphone, electrocardiogram, and pulse oximetry.  MEDICATIONS     albuterol (PROVENTIL HFA;VENTOLIN HFA) 108 (90 Base) MCG/ACT inhaler         budesonide-formoterol (SYMBICORT) 160-4.5 MCG/ACT inhaler         empagliflozin (JARDIANCE) 10 MG TABS tablet         gabapentin (NEURONTIN) 300 MG capsule         glucose blood (ONETOUCH VERIO) test strip         Insulin Glargine (LANTUS SOLOSTAR) 100 UNIT/ML Solostar Pen         insulin lispro (HUMALOG KWIKPEN) 100 UNIT/ML KiwkPen         Lancet Devices (ONE TOUCH DELICA LANCING DEV) MISC         omeprazole (PRILOSEC) 20 MG capsule      Medications self-administered by patient taken the night of the study : N/A  SLEEP ARCHITECTURE The study was initiated at 11:03:24 PM and ended at 5:04:44 AM.  Sleep onset time was 13.1 minutes and the sleep efficiency was 87.1%%. The total sleep time was 314.7 minutes.  Wake after sleep onset  (WASO) was 33.5 minutes.  Stage REM latency was 60.5 minutes.  The patient spent 6.7%% of the night in stage N1 sleep, 62.3%% in stage N2 sleep, 11.6%% in stage N3 and 19.4% in REM.  Alpha intrusion was absent.  Supine sleep was 68.38%.  RESPIRATORY PARAMETERS The overall apnea/hypopnea index (AHI) was 4.0 per hour. The respiratory disturbance index (RDI) was 6.5/h.There were 2 total apneas, including 1 obstructive, 1 central and 0 mixed apneas. There were 19 hypopneas and 13 RERAs.  The AHI during Stage REM sleep was 14.8 per hour.  AHI while supine was 4.7 per hour.  The mean oxygen saturation was 92.3%. The minimum SpO2 during sleep was 86.0%.  Soft snoring was noted during this study.  CARDIAC DATA The 2 lead EKG demonstrated sinus rhythm. The mean heart rate was 81.7 beats per minute. Other EKG findings include: None.  LEG MOVEMENT DATA The total PLMS were 0 with a resulting PLMS index of 0.0. Associated arousal with leg movement index was 0.0 .  IMPRESSIONS - Increased upper airway resistance syndrome overall (AHI  4.0/h; RDI 6.5/h) with moderate sleep apnea during REM sleep.  - No significant central sleep apnea occurred during this study (CAI = 0.2/h). - Mild oxygen desaturation to a nadir of 86.0%. - The patient snored with soft snoring volume. - No cardiac abnormalities were noted during this study. - Clinically significant periodic limb movements did not occur during sleep. No significant associated arousals.  DIAGNOSIS - Sleep  Apnea, unspecified type G47.30 - Bruxism (327.53 [G47.63 ICD-10]) - Nocturnal Hypoxemia (327.26 [G47.36 ICD-10])  RECOMMENDATIONS - In this patient symptomatic patient with cardiovascuylar co-morbidities and previously diagnosed OSA requiring CPAP and moderte OSA during REM sleep on the present study, recommend a CPAP titration trial for optimal treatment.  However, if patient is against CPAP, recommend a customized oral appliance for  treatment. - Efforts should be made to optimize nasal and oropharyngeal patency. - Consider oral bite guard for bruxism - Avoid alcohol, sedatives and other CNS depressants that may worsen sleep apnea and disrupt normal sleep architecture. - Sleep hygiene should be reviewed to assess factors that may improve sleep quality. - Weight management (BMI 32) and regular exercise should be initiated or continued if appropriate.   [Electronically signed] 06/22/2018 01:08 PM  Shelva Majestic MD, [facility], ABSM Diplomate, American Board of Sleep Medicine   NPI: [id number] [place] PH: [phone]   FX: [phone] [place]

## 2018-06-23 ENCOUNTER — Other Ambulatory Visit: Payer: Self-pay | Admitting: Cardiovascular Disease

## 2018-06-23 ENCOUNTER — Telehealth: Payer: Self-pay | Admitting: *Deleted

## 2018-06-23 DIAGNOSIS — G4733 Obstructive sleep apnea (adult) (pediatric): Secondary | ICD-10-CM

## 2018-06-23 NOTE — Telephone Encounter (Signed)
-----   Message from Lisa Sine, MD sent at 06/22/2018  1:15 PM EDT ----- Lisa Cole, please notify pt of results; Will benefit from CPAP. If pt uncertain, then clinic evaluation to discuss

## 2018-06-23 NOTE — Telephone Encounter (Signed)
Patient notified of sleep study results and recommendations. She agreed to having a CPAP titration. Appointment scheduled for 07/12/18. Patient aware of appointment.

## 2018-06-25 ENCOUNTER — Encounter: Payer: Self-pay | Admitting: Cardiovascular Disease

## 2018-06-25 ENCOUNTER — Ambulatory Visit (INDEPENDENT_AMBULATORY_CARE_PROVIDER_SITE_OTHER): Payer: Medicare Other | Admitting: Cardiovascular Disease

## 2018-06-25 VITALS — BP 108/78 | HR 89 | Ht 60.0 in | Wt 149.0 lb

## 2018-06-25 DIAGNOSIS — G4733 Obstructive sleep apnea (adult) (pediatric): Secondary | ICD-10-CM

## 2018-06-25 DIAGNOSIS — R002 Palpitations: Secondary | ICD-10-CM

## 2018-06-25 DIAGNOSIS — R000 Tachycardia, unspecified: Secondary | ICD-10-CM

## 2018-06-25 DIAGNOSIS — R0789 Other chest pain: Secondary | ICD-10-CM

## 2018-06-25 DIAGNOSIS — I4711 Inappropriate sinus tachycardia, so stated: Secondary | ICD-10-CM | POA: Insufficient documentation

## 2018-06-25 HISTORY — DX: Inappropriate sinus tachycardia, so stated: I47.11

## 2018-06-25 HISTORY — DX: Tachycardia, unspecified: R00.0

## 2018-06-25 MED ORDER — DILTIAZEM HCL 30 MG PO TABS: 30.0000 mg | ORAL_TABLET | Freq: Two times a day (BID) | ORAL | 1 refills | Status: DC

## 2018-06-25 NOTE — Patient Instructions (Addendum)
Medication Instructions:  START DILTIAZEM 30 MG TWICE A DAY   Labwork: NONE  Testing/Procedures: NONE  Follow-Up: Your physician recommends that you schedule a follow-up appointment in: [place] TO SEE GASTROENTEROLOGY, WILL CALL WITH AN APPOINTMENT   If you need a refill on your cardiac medications before your next appointment, please call your pharmacy.

## 2018-06-25 NOTE — Progress Notes (Signed)
Cardiology Office Note   Date:  06/25/2018   ID:  Lisa Cole, Lisa Cole [birth year], MRN [id number]  PCP:  Elisabeth Cara, PA-C  Cardiologist:   Skeet Latch, MD   No chief complaint on file.    History of Present Illness: Lisa Cole is a 37 y.o. female with asthma, diabetes, obstructive sleep apnea, obesity, and SVT here for follow up.  She was initially seen 04/10/18 for the evaluation of chest pain and palpitations.  [name] reports being diagnosed with sinus tachycardia at age 35.  She was initially treated with metoprolol and felt better.  However this was discontinued when she became pregnant at age 41.  That pregnancy was complicated by gestational diabetes and she is had diabetes ever since that time.  Her diabetes has not been well-controlled.  She has a long-standing history of chest pain and palpitations.  [name] was seen in the ED 11/2017 with chest pain and palpitations.  This occurred in the setting of being treated for an upper respiratory infection.  Cardiac enzymes and d-dimer were negative.  She was noted to have chest wall tenderness on exam.  EKG was without acute changes.  She describes it as feeling like a toothache.  It is worse when she gets upset or when she drinks ice cold water.  She recently started by exercising with Zumba and hip hop dance without exertional chest pain or shortness of breath.  She also reports frequent episodes of dizziness followed by a drop in her blood pressure.  This occurs less than once per month.  She is unable to recall if it happens more with changes in position or exertion.  She also reports heart racing that is most prominent at night.  She denies syncope.  She has a diagnosis of OSA but hasn't used her CPAP in years.  Her father had an MI in his early 95s.   [name] was referred for a 14 day event monitor.  Her monitor was inadvertantly switched with another patient and there was concern for atrial  fibrillation so she was referred to the atrial fibrillation clinic and had an echo 04/29/18 that revealed LVEF 60 to 65% with normal diastolic function.  Her actual monitor was notable for SVT.  However there was confusion about which monitor was correct so she wore a new monitor for 2 weeks that showed episodes of symptomatic sinus tachycardia but no SVT.  She reports having another episode of palpitations that occurred after taking off the monitor.  [name] continues to have frequent episodes of palpitations.  They occur randomly.  She also notices them when she lies flat on her back.  Since her last appointment she had a positive sleep study and is awaiting CPAP titration trial.  She reports that she sometimes feels food gets stuck when she swallows it.  This leads her to have more palpitations and chest pain.  She had a coronary CT-a on 06/20/2018 that revealed no CAD and a coronary calcium score of 0.  She has no chest pain and reports that her BP has been running low lately.  She reports episodes of dysphagia and sometimes her food gets stuck.  She sometimes has L sided and central chest burning after eating or when lying down.   Past Medical History:  Diagnosis Date  . Anemia   . Anxiety disorder   . Arthritis   . Asthma   . Atypical chest pain 04/10/2018  . BV (bacterial  vaginosis)   . Chronic pelvic pain in female   . Diabetes mellitus   . Endometriosis   . Hashimoto thyroiditis   . Hepatic steatosis   . Hypertension   . IBS (irritable bowel syndrome)   . IC (interstitial cystitis)   . Inappropriate sinus tachycardia 06/25/2018  . Obesity   . OSA (obstructive sleep apnea)   . SVT (supraventricular tachycardia) ([place])   . Type 2 diabetes mellitus ([place])     Past Surgical History:  Procedure Laterality Date  . bladder staple    . CYSTOSTOMY W/ BLADDER BIOPSY    . SHOULDER SURGERY  2008   right  . TOTAL ABDOMINAL HYSTERECTOMY  2004  . TUBAL LIGATION  2001  . WRIST SURGERY  2008     right     Current Outpatient Medications  Medication Sig Dispense Refill  . albuterol (PROVENTIL HFA;VENTOLIN HFA) 108 (90 Base) MCG/ACT inhaler Inhale 2 puffs into the lungs every 4 (four) hours as needed for wheezing or shortness of breath.     . budesonide-formoterol (SYMBICORT) 160-4.5 MCG/ACT inhaler Inhale 2 puffs into the lungs 2 (two) times daily. 1 Inhaler 6  . empagliflozin (JARDIANCE) 10 MG TABS tablet Take 10 mg by mouth daily. 90 tablet 3  . gabapentin (NEURONTIN) 300 MG capsule Take I capsule qd for 1 week, then take 1 capsule bid for 1 week, then take 1 capsule tid    . glucose blood (ONETOUCH VERIO) test strip Use as instructed to check sugar 2 times daily. 200 each 3  . Insulin Glargine (LANTUS SOLOSTAR) 100 UNIT/ML Solostar Pen inject 40 units subcutaneously at bedtime 45 mL 3  . insulin lispro (HUMALOG KWIKPEN) 100 UNIT/ML KiwkPen INJECT 15 TO 20 UNITS UNDER THE SKIN THREE TIMES DAILY BEFORE A MEAL 54 mL 0  . Lancet Devices (ONE TOUCH DELICA LANCING DEV) MISC Use to check sugar 2 times daily. 200 each 3  . omeprazole (PRILOSEC) 20 MG capsule Take 1 capsule by mouth daily.    Marland Kitchen diltiazem (CARDIZEM) 30 MG tablet Take 1 tablet (30 mg total) by mouth 2 (two) times daily. 180 tablet 1   No current facility-administered medications for this visit.     Allergies:   Liraglutide; Codeine; Acetaminophen; Ibuprofen; Amoxicillin; Metformin; and Penicillins    Social History:  The patient  reports that she has never smoked. She has never used smokeless tobacco. She reports that she does not drink alcohol or use drugs.   Family History:  The patient's family history includes Alcohol abuse in her unknown relative; Arthritis in her maternal grandmother and sister; Cirrhosis in her unknown relative; Colon cancer in her unknown relative; Colon polyps in her unknown relative; Diabetes in her daughter, father, mother, paternal grandfather, and paternal grandmother; Glaucoma in her paternal  grandmother; Heart attack in her father; Heart disease in her maternal grandfather and unknown relative; Heart failure in her maternal grandmother and paternal grandmother; Hyperlipidemia in her unknown relative; Hypertension in her daughter, father, paternal grandfather, paternal grandmother, and unknown relative; Kidney disease in her unknown relative; Kidney failure in her maternal grandmother; Liver disease in her unknown relative; Lung cancer in her maternal grandfather and unknown relative; Prostate cancer in her unknown relative; Psoriasis in her sister; Thyroid disease in her unknown relative.    ROS:  Please see the history of present illness.   Otherwise, review of systems are positive for insomnia, leg pain.   All other systems are reviewed and negative.    PHYSICAL  EXAM: VS:  BP 108/78   Pulse 89   Ht 5' (1.524 m)   Wt 149 lb (67.6 kg)   SpO2 98%   BMI 29.10 kg/m  , BMI Body mass index is 29.1 kg/m. GENERAL:  Well appearing.  No acute distress. HEENT: Pupils equal round and reactive, fundi not visualized, oral mucosa unremarkable NECK:  No jugular venous distention, waveform within normal limits, carotid upstroke brisk and symmetric, no bruits LUNGS:  Clear to auscultation bilaterally HEART:  RRR.  PMI not displaced or sustained,S1 and S2 within normal limits, no S3, no S4, no clicks, no rubs, no murmurs ABD:  Flat, positive bowel sounds normal in frequency in pitch, no bruits, no rebound, no guarding, no midline pulsatile mass, no hepatomegaly, no splenomegaly EXT:  2 plus pulses throughout, no edema, no cyanosis no clubbing SKIN:  No rashes no nodules NEURO:  Cranial nerves II through XII grossly intact, motor grossly intact throughout PSYCH:  Cognitively intact, oriented to person place and time   EKG:  EKG is not ordered today. 05/02/18: Sinus rhythm.  Rate 80 bpm.   30 Day Event Monitor 05/01/18:  Quality: Fair.  Baseline artifact. Predominant rhythm: sinus rhythm,  sinus tachycardia Average heart rate: 93 bpm Max heart rate: 162 bpm Min heart rate: 62 bpm Pauses >2.5 seconds: none No arrhythmias   Echo 04/29/2018: Study Conclusions  - Left ventricle: The cavity size was normal. Systolic function was   normal. The estimated ejection fraction was in the range of 60%   to 65%. Wall motion was normal; there were no regional wall   motion abnormalities. Left ventricular diastolic function   parameters were normal. - Aortic valve: Transvalvular velocity was within the normal range.   There was no stenosis. There was no significant regurgitation. - Mitral valve: Transvalvular velocity was within the normal range.   There was no evidence for stenosis. There was no significant   regurgitation. - Atrial septum: No defect or patent foramen ovale was identified. - Tricuspid valve: There was no significant regurgitation. - Pulmonic valve: There was no significant regurgitation.  Impressions:  - Normal LV systolic and diastolic function. No significant   valvular abnormalities.  Recent Labs: 04/10/2018: Hemoglobin 13.9; Magnesium 2.0; Platelets 206 06/13/2018: BUN 11; Creat 0.67; Potassium 4.7; Sodium 138; TSH 1.19    Lipid Panel    Component Value Date/Time   CHOL 141 04/10/2018 0935   TRIG 127 04/10/2018 0935   HDL 38 (L) 04/10/2018 0935   CHOLHDL 3.7 04/10/2018 0935   CHOLHDL 3 02/16/2010 1556   VLDL 20.0 02/16/2010 1556   LDLCALC 78 04/10/2018 0935      Wt Readings from Last 3 Encounters:  06/25/18 149 lb (67.6 kg)  06/13/18 148 lb (67.1 kg)  06/07/18 165 lb (74.8 kg)      ASSESSMENT AND PLAN:  # Atypical chest pain:  No exertional symptoms. CT-A was negative and had no CAD.  She also had dysphagia and food gets stuck when eating.  We will refer her to GI for consideration of upper endoscopy.  # Palpitations: SVT vs. Sinus tachycardia on monitor.  She is symptomatic and there is no clear cause.  Her BP is low.  Her heart rate  did not respond very much to metoprolol when she had coronary CT.  We will try diltiazem 30mg  po bid.  # OSA: She ws found to have OSA on sleep study.Titration pending.  Current medicines are reviewed at length with the patient today.  The patient does not have concerns regarding medicines.  The following changes have been made:  no change  Labs/ tests ordered today include:   No orders of the defined types were placed in this encounter.    Disposition:   FU with Laramie Meissner C. Oval Linsey, MD, [facility] in 6 weeks    Signed, [place] Oval Linsey, MD, [facility]  06/25/2018 7:04 PM    [place]

## 2018-06-27 ENCOUNTER — Ambulatory Visit (INDEPENDENT_AMBULATORY_CARE_PROVIDER_SITE_OTHER): Payer: Medicare Other | Admitting: Psychology

## 2018-06-27 DIAGNOSIS — F331 Major depressive disorder, recurrent, moderate: Secondary | ICD-10-CM

## 2018-06-27 DIAGNOSIS — F411 Generalized anxiety disorder: Secondary | ICD-10-CM

## 2018-07-03 ENCOUNTER — Other Ambulatory Visit: Payer: Self-pay

## 2018-07-03 ENCOUNTER — Encounter: Payer: Self-pay | Admitting: Internal Medicine

## 2018-07-03 MED ORDER — GLUCOSE BLOOD VI STRP: ORAL_STRIP | 3 refills | Status: DC

## 2018-07-03 MED ORDER — FLUCONAZOLE 150 MG PO TABS: 150.0000 mg | ORAL_TABLET | Freq: Once | ORAL | 1 refills | Status: AC

## 2018-07-03 MED ORDER — INSULIN PEN NEEDLE 32G X 4 MM MISC: 2 refills | Status: DC

## 2018-07-03 NOTE — Telephone Encounter (Signed)
RX sent

## 2018-07-08 ENCOUNTER — Ambulatory Visit (INDEPENDENT_AMBULATORY_CARE_PROVIDER_SITE_OTHER): Payer: Medicare Other | Admitting: Psychology

## 2018-07-08 DIAGNOSIS — F411 Generalized anxiety disorder: Secondary | ICD-10-CM

## 2018-07-08 DIAGNOSIS — F331 Major depressive disorder, recurrent, moderate: Secondary | ICD-10-CM

## 2018-07-12 ENCOUNTER — Encounter (HOSPITAL_BASED_OUTPATIENT_CLINIC_OR_DEPARTMENT_OTHER): Payer: Self-pay

## 2018-07-14 DIAGNOSIS — M25521 Pain in right elbow: Secondary | ICD-10-CM | POA: Insufficient documentation

## 2018-07-17 ENCOUNTER — Encounter: Payer: Self-pay | Admitting: Internal Medicine

## 2018-07-22 ENCOUNTER — Other Ambulatory Visit: Payer: Self-pay | Admitting: Internal Medicine

## 2018-07-22 MED ORDER — GLUCOSE BLOOD VI STRP: ORAL_STRIP | 11 refills | Status: DC

## 2018-07-24 ENCOUNTER — Ambulatory Visit: Payer: Medicare Other | Admitting: Psychology

## 2018-08-01 ENCOUNTER — Ambulatory Visit: Payer: Medicare Other | Admitting: Psychology

## 2018-08-01 ENCOUNTER — Encounter: Payer: Self-pay | Admitting: Internal Medicine

## 2018-08-04 ENCOUNTER — Telehealth: Payer: Self-pay

## 2018-08-04 ENCOUNTER — Other Ambulatory Visit: Payer: Self-pay | Admitting: Internal Medicine

## 2018-08-04 MED ORDER — ONETOUCH DELICA LANCING DEV MISC: 3 refills | Status: AC

## 2018-08-04 MED ORDER — FREESTYLE LIBRE 14 DAY READER DEVI: 1.0000 | Freq: Once | 1 refills | Status: AC

## 2018-08-04 MED ORDER — FREESTYLE LIBRE 14 DAY SENSOR MISC: 1.0000 | 11 refills | Status: DC

## 2018-08-04 MED ORDER — GLUCOSE BLOOD VI STRP: ORAL_STRIP | 11 refills | Status: DC

## 2018-08-04 MED ORDER — ONETOUCH VERIO W/DEVICE KIT: PACK | 0 refills | Status: DC

## 2018-08-04 NOTE — Telephone Encounter (Signed)
Can you please let her know about this?  Please asked her to check 4 times a day for 2 weeks and fill out 1 of my CBG logs and send it to the office and then I can change the note.

## 2018-08-04 NOTE — Telephone Encounter (Signed)
PA is needed for CGM and must meet 4 requirements with Medicare once of which is documentation for patient checking BS 4 times a day, at last visit chart note states patient has not been checking.  We will need documented proof in chart note that patient is checking 4 times a day.

## 2018-08-05 DIAGNOSIS — M79641 Pain in right hand: Secondary | ICD-10-CM | POA: Insufficient documentation

## 2018-08-05 NOTE — Telephone Encounter (Signed)
Notified patient and she will check 4 times a day for two weeks then report that to [place].

## 2018-08-07 ENCOUNTER — Ambulatory Visit: Payer: Medicare Other | Admitting: Cardiovascular Disease

## 2018-08-08 ENCOUNTER — Encounter (HOSPITAL_BASED_OUTPATIENT_CLINIC_OR_DEPARTMENT_OTHER): Payer: Self-pay

## 2018-08-13 ENCOUNTER — Ambulatory Visit (INDEPENDENT_AMBULATORY_CARE_PROVIDER_SITE_OTHER): Payer: Medicare Other | Admitting: Psychology

## 2018-08-13 DIAGNOSIS — F331 Major depressive disorder, recurrent, moderate: Secondary | ICD-10-CM

## 2018-08-13 DIAGNOSIS — F411 Generalized anxiety disorder: Secondary | ICD-10-CM | POA: Diagnosis not present

## 2018-08-28 ENCOUNTER — Ambulatory Visit (INDEPENDENT_AMBULATORY_CARE_PROVIDER_SITE_OTHER): Payer: Medicare Other | Admitting: Psychology

## 2018-08-28 DIAGNOSIS — F331 Major depressive disorder, recurrent, moderate: Secondary | ICD-10-CM

## 2018-08-28 DIAGNOSIS — F411 Generalized anxiety disorder: Secondary | ICD-10-CM | POA: Diagnosis not present

## 2018-09-03 ENCOUNTER — Telehealth: Payer: Self-pay

## 2018-09-03 NOTE — Telephone Encounter (Signed)
Left a VM for Lisa Cole to call back about the PREP at the [facility] to start Jan 27th from 6-715pm.

## 2018-09-06 ENCOUNTER — Encounter (HOSPITAL_BASED_OUTPATIENT_CLINIC_OR_DEPARTMENT_OTHER): Payer: Self-pay

## 2018-09-18 ENCOUNTER — Ambulatory Visit (INDEPENDENT_AMBULATORY_CARE_PROVIDER_SITE_OTHER): Payer: Medicare Other | Admitting: Internal Medicine

## 2018-09-18 ENCOUNTER — Encounter: Payer: Self-pay | Admitting: Internal Medicine

## 2018-09-18 VITALS — BP 120/78 | HR 92 | Ht 60.0 in | Wt 149.0 lb

## 2018-09-18 DIAGNOSIS — E063 Autoimmune thyroiditis: Secondary | ICD-10-CM

## 2018-09-18 DIAGNOSIS — Z8639 Personal history of other endocrine, nutritional and metabolic disease: Secondary | ICD-10-CM

## 2018-09-18 DIAGNOSIS — E1165 Type 2 diabetes mellitus with hyperglycemia: Secondary | ICD-10-CM

## 2018-09-18 DIAGNOSIS — Z794 Long term (current) use of insulin: Secondary | ICD-10-CM | POA: Diagnosis not present

## 2018-09-18 LAB — POCT GLYCOSYLATED HEMOGLOBIN (HGB A1C): Hemoglobin A1C: 11.2 % — AB (ref 4.0–5.6)

## 2018-09-18 MED ORDER — INSULIN DEGLUDEC 200 UNIT/ML ~~LOC~~ SOPN: 40.0000 [IU] | PEN_INJECTOR | Freq: Every day | SUBCUTANEOUS | 5 refills | Status: DC

## 2018-09-18 NOTE — Progress Notes (Signed)
Patient ID: Lisa Cole, female   DOB: Nov 04, 1980, 37 y.o.   MRN: [id number]  HPI: Lisa Cole is a 37 y.o.-year-old female, referred by Dr. Zigmund Daniel for management of DM2, dx as GDM (at 37 y/o), then DM2  since 2000 insulin-dependent, uncontrolled, with complications: PN). She was previously seeing Dr. Meredith Pel and Dr. Chalmers Cater.  Last visit with me 3 months ago. PCP: Dr Jearld Pies + [facility]  Before last visit, she was again out of her diabetes medications and not checking sugars.  She needed a surgical clearance letter at that time, however, I could not clear her for surgery since her HbA1c was 12.8%.  Surgeon: Dr. Jodelle Gross.  At this visit, she tells me she stopped her Lantus 6 weeks ago as she was getting burning and itching in her entire body when taking both Humalog and Lantus. Ended Humalog dose, but a lower dose as she forgot the dose I recommended. Sugars are still very high, mostly in the 300s.  Last hemoglobin A1c was: Lab Results  Component Value Date   HGBA1C 12.8 (A) 06/13/2018   HGBA1C 12.5 (A) 03/13/2018   HGBA1C 12.5 09/05/2016  11/03/2015: HbA1c 12.2% 06/03/2014: HbA1c 8.5% 11/23/2013: HbA1c 7.9%  Previously on: - Lantus 10 >> 30  >> 40-50 units at bedtime - may forget 1-2x a week; mentions occasional cough and wheezing - She stopped Januvia 100 mg in the past - She stopped Invokana (started by PCP) - mm spasms; this was retried 05/2017 >> thrush, yeast inf - Metformin IR and ER >> N/V/D/"spacing out" - Victoza >> dysphagia and rash - Actos >> no pbs   Then on: - NovoLog >> Humalog 40-60 units before meals - but itching, wheezing  She was on Lantus 40 units at bedtime >> stopped   At last visit she was on: - Jardiance 10 mg before breakfast >> leg cramps  - ran out 1 mo ago!!! - Lantus 40 units at bedtime >> ran out 1 mo ago!!!! - Humalog 15 to 20 units before meals  >> 10 units per meal!  We changed to: -Jardiance 10 mg before breakfast  -Lantus 40  units at bedtime >> stopped 6 weeks ago!!! -Humalog 15-20 >> 12 units before meals She could not tolerate Invokana because of muscle cramps.  At last visit, patient was not checking sugars.  Now checking 1-3 times a day - per meter download. - am: 165, 224-315 >> 225-347 >> 329-364 >> 292-382 - 2h after b'fast: 259, 304 >> n/c - before lunch: 201-301 >> 285 >> n/c >> 275-339 - 2h after lunch: n/c >> 348 >> n/c >> 312-392 - before dinner: n/c >> 322-405 - 2h after dinner: 315, 328 >> n/c >> 211-430 - bedtime: n/c >> 305-325 - nighttime: n/c Lowest sugar was 329 >> 211; she has hypoglycemia awareness in the 70s. Highest sugar was 364 >> 430. She was in the emergency room with hyperglycemia but without DKA in 02/2016.  Glucometer: AccuChek [employer]iva  She saw Antonieta Iba with nutrition in the past.  She stopped drinking sodas afterwards.  -No CKD, last BUN/creatinine:  Lab Results  Component Value Date   BUN 11 06/13/2018   CREATININE 0.67 06/13/2018  11/03/2015: 9/0.7, Glu 315, ACR 4.1 09/07/2015: ACR 15 06/03/2014: ACR <2.2 She was supposed to be on lisinopril but she is not on an ACE inhibitor/ARB  -+ HL; last set of lipids: Lab Results  Component Value Date   CHOL 141 04/10/2018   HDL  38 (L) 04/10/2018   LDLCALC 78 04/10/2018   TRIG 127 04/10/2018   CHOLHDL 3.7 04/10/2018  10/05/2016: 231/283/34/40 11/03/2015: 139/169/33/72 09/07/2015: 143/143/36/78 She is not on a statin.  - last eye exam was in 09/2014: No DR  -She has numbness and tingling in her feet. + Neurontin and now given a Rx of Amitriptyline - did not start.  She also has a history of thyroid nodules.  I reviewed the thyroid ultrasound report from 03/24/2014 and there were no worrisome thyroid nodules, only a small 3 mm nodule in the right lobe.  She apparently had another ultrasound the 11/2014, but the report is not available to me.  Latest TSH: Lab Results  Component Value Date   TSH 1.19 06/13/2018   11/03/2015: TSH 0.89 11/03/2015: TSH 0.89, free T4  0.85 (0.6-1.5)  ROS: Constitutional: no weight gain/no weight loss, + fatigue, no subjective hyperthermia, no subjective hypothermia, + nocturia  Eyes: + Blurry vision, no xerophthalmia ENT: no sore throat, no nodules palpated in neck, + dysphagia, no odynophagia, no hoarseness Cardiovascular: no CP/no SOB/no palpitations/no leg swelling Respiratory: no cough/no SOB/no wheezing Gastrointestinal: no N/no V/no D/no C/no acid reflux Musculoskeletal: + Muscle aches/+ joint aches Skin: no rashes, no hair loss Neurological: no tremors/+ numbness/+ tingling/no dizziness, + headaches  I reviewed pt's medications, allergies, PMH, social hx, family hx, and changes were documented in the history of present illness. Otherwise, unchanged from my initial visit note.  Past Medical History:  Diagnosis Date  . Anemia   . Anxiety disorder   . Arthritis   . Asthma   . Atypical chest pain 04/10/2018  . BV (bacterial vaginosis)   . Chronic pelvic pain in female   . Diabetes mellitus   . Endometriosis   . Hashimoto thyroiditis   . Hepatic steatosis   . Hypertension   . IBS (irritable bowel syndrome)   . IC (interstitial cystitis)   . Inappropriate sinus tachycardia 06/25/2018  . Obesity   . OSA (obstructive sleep apnea)   . SVT (supraventricular tachycardia) ([place])   . Type 2 diabetes mellitus ([place])    Past Surgical History:  Procedure Laterality Date  . bladder staple    . CYSTOSTOMY W/ BLADDER BIOPSY    . SHOULDER SURGERY  2008   right  . TOTAL ABDOMINAL HYSTERECTOMY  2004  . TUBAL LIGATION  2001  . WRIST SURGERY  2008   right   Social History   Social History  . Marital Status: Divorced    Spouse Name: N/A  . Number of Children: 3   Occupational History  .  retirement    Social History Main Topics  . Smoking status: Never Smoker   . Smokeless tobacco: Never Used  . Alcohol Use: No  . Drug Use: No   Social History  Narrative   Exercise: yes, working out w/ Physiological scientist   Diet: incorporated more fruits and vegetables    Current Outpatient Medications on File Prior to Visit  Medication Sig Dispense Refill  . albuterol (PROVENTIL HFA;VENTOLIN HFA) 108 (90 Base) MCG/ACT inhaler Inhale 2 puffs into the lungs every 4 (four) hours as needed for wheezing or shortness of breath.     . Blood Glucose Monitoring Suppl (ONETOUCH VERIO) w/Device KIT Use as advised 1 kit 0  . budesonide-formoterol (SYMBICORT) 160-4.5 MCG/ACT inhaler Inhale 2 puffs into the lungs 2 (two) times daily. 1 Inhaler 6  . Continuous Blood Gluc Sensor (FREESTYLE LIBRE 14 DAY SENSOR) MISC 1 each by  Does not apply route every 14 (fourteen) days. Change every 2 weeks 2 each 11  . diltiazem (CARDIZEM) 30 MG tablet Take 1 tablet (30 mg total) by mouth 2 (two) times daily. 180 tablet 1  . empagliflozin (JARDIANCE) 10 MG TABS tablet Take 10 mg by mouth daily. 90 tablet 3  . gabapentin (NEURONTIN) 300 MG capsule Take I capsule qd for 1 week, then take 1 capsule bid for 1 week, then take 1 capsule tid    . glucose blood test strip Use as instructed 3x a day - One Touch Verio 200 each 11  . Insulin Glargine (LANTUS SOLOSTAR) 100 UNIT/ML Solostar Pen inject 40 units subcutaneously at bedtime 45 mL 3  . insulin lispro (HUMALOG KWIKPEN) 100 UNIT/ML KiwkPen INJECT 15 TO 20 UNITS UNDER THE SKIN THREE TIMES DAILY BEFORE A MEAL 54 mL 0  . Insulin Pen Needle (BD PEN NEEDLE NANO 2ND GEN) 32G X 4 MM MISC Use to inject insulin. 200 each 2  . Lancet Devices (ONE TOUCH DELICA LANCING DEV) MISC Use to check sugar 2 times daily. 200 each 3  . omeprazole (PRILOSEC) 20 MG capsule Take 1 capsule by mouth daily.     No current facility-administered medications on file prior to visit.    Allergies  Allergen Reactions  . Liraglutide Hives, Itching and Other (See Comments)    "Burning sensation" "Burning sensation"  . Codeine Nausea Only  . Acetaminophen Other (See  Comments)    Advised not to take due to peptic ulcer  . Ibuprofen Other (See Comments)    Advised not to take due to peptic ulcer  . Amoxicillin Swelling and Rash    Has patient had a PCN reaction causing immediate rash, facial/tongue/throat swelling, SOB or lightheadedness with hypotension:yes Has patient had a PCN reaction causing severe rash involving mucus membranes or skin necrosis: no Has patient had a PCN reaction that required hospitalization: yes Has patient had a PCN reaction occurring within the last 10 years: no If all of the above answers are "NO", then may proceed with Cephalosporin use.   . Metformin Diarrhea    "GI upset even with Metformin XR"  . Penicillins Swelling and Rash    Has patient had a PCN reaction causing immediate rash, facial/tongue/throat swelling, SOB or lightheadedness with hypotension: Yes Has patient had a PCN reaction causing severe rash involving mucus membranes or skin necrosis: No Has patient had a PCN reaction that required hospitalization Yes Has patient had a PCN reaction occurring within the last 10 years: No If all of the above answers are "NO", then may proceed with Cephalosporin use.    Family History  Problem Relation Age of Onset  . Diabetes Mother   . Hypertension Father   . Heart attack Father   . Diabetes Father   . Alcohol abuse Unknown   . Hypertension Unknown   . Hyperlipidemia Unknown   . Kidney disease Unknown   . Colon cancer Unknown        uncle  . Prostate cancer Unknown        uncles and grandfather  . Cirrhosis Unknown        both grandmother and grandfather  . Lung cancer Unknown   . Heart disease Unknown   . Thyroid disease Unknown   . Colon polyps Unknown   . Liver disease Unknown   . Arthritis Maternal Grandmother   . Heart failure Maternal Grandmother   . Kidney failure Maternal Grandmother   . Diabetes  Daughter        type 2  . Hypertension Daughter   . Psoriasis Sister   . Arthritis Sister   . Lung  cancer Maternal Grandfather   . Heart disease Maternal Grandfather   . Diabetes Paternal Grandmother   . Glaucoma Paternal Grandmother   . Hypertension Paternal Grandmother   . Heart failure Paternal Grandmother   . Diabetes Paternal Grandfather   . Hypertension Paternal Grandfather    PE: BP 120/78   Pulse 92   Ht 5' (1.524 m) Comment: measured  Wt 149 lb (67.6 kg)   SpO2 98%   BMI 29.10 kg/m  Body mass index is 29.1 kg/m.  Wt Readings from Last 3 Encounters:  09/18/18 149 lb (67.6 kg)  06/25/18 149 lb (67.6 kg)  06/13/18 148 lb (67.1 kg)   Constitutional: overweight, in NAD Eyes: PERRLA, EOMI, no exophthalmos ENT: moist mucous membranes, no thyromegaly, no cervical lymphadenopathy Cardiovascular: tachycardia, RR, No MRG Respiratory: CTA B Gastrointestinal: abdomen soft, NT, ND, BS+ Musculoskeletal: no deformities, strength intact in all 4 Skin: moist, warm, no rashes Neurological: no tremor with outstretched hands, DTR normal in all 4  ASSESSMENT: 1. DM2, insulin-dependent, uncontrolled, without complications  2.  History of thyroid nodules  3.  Hashimoto's thyroiditis  PLAN:  1. Patient with longstanding, uncontrolled, type 2 diabetes, usually not compliant with visits and medications.  She has a poor diet, consisting mostly of fast foods.  We discussed at length at every visit about the absolute need to improve her diet.  She stopped sodas, however, after she is on nutrition. -At last visit, she returned again being out of her diabetes medicines for approximately 1 month we discussed that this is absolutely unacceptable and she should let me know right away if she has problems obtaining her insulin.  She was also not checking sugars and I strongly advised her to restart.  She was very disconcerted about her condition at last visit and we discussed that she needs to start gaining control over it.  The first thing that she needs to do for this is to start taking medicines  as prescribed.  At that time, she wanted me to schedule her to come back sooner than 3 months.  I scheduled her in 1.5 months.  She did not return then, but comes now, 3.5 months from the previous visit. -At last visit, I could not clear her for surgery from the diabetes point of view as her HbA1c was 12.8% - at this visit, sugars are very high, as she is not taking long-acting insulin again.  We discussed again about the absolute need to take both short and long-acting insulin.  Since she had some burning and itching after injection of Lantus in the past, I suggested to switch to [place].  I gave her a sample pen today.  Will use U200 insulin.  We will start with a low dose and I advised her to increase as needed (see below).  I also advised her to increase her Humalog dose, as previously recommended. - I suggested to:  Patient Instructions  Please continue: - Jardiance 10 mg before breakfast   Please start: - Tresiba U200 20 and increase by 4 units every 4 days until sugars in am <140 or if you get to 40 units.   Please increase: - Humalog 15-20 units before meals  Please return in 3 months with your sugar log.   - today, HbA1c is 11.2% - lower, but still extremely  high - continue checking sugars at different times of the day - check 3-4x a day, rotating checks - advised for yearly eye exams >> she is not UTD - Return to clinic in 3 mo with sugar log     2.  History of thyroid nodules -No neck compression symptoms except mild dysphagia which is not new -Reviewed the report of her latest thyroid ultrasound: She only has a small 3 mm thyroid nodule -We discussed that no follow-up is needed for this unless she starts developing neck compression symptoms   3.  Hashimoto's thyroiditis -Euthyroid -Not on levothyroxine -Latest TSH reviewed: Normal in 05/2018   [name], MD PhD [facility]

## 2018-09-18 NOTE — Patient Instructions (Signed)
Please continue: - Jardiance 10 mg before breakfast   Please start: - Tresiba U200 20 and increase by 4 units every 4 days until sugars in am <140 or if you get to 40 units.   Please increase: - Humalog 15-20 units before meals  Please return in 3 months with your sugar log.

## 2018-09-26 ENCOUNTER — Ambulatory Visit (INDEPENDENT_AMBULATORY_CARE_PROVIDER_SITE_OTHER): Payer: Medicare Other | Admitting: Psychology

## 2018-09-26 DIAGNOSIS — F331 Major depressive disorder, recurrent, moderate: Secondary | ICD-10-CM | POA: Diagnosis not present

## 2018-09-26 DIAGNOSIS — F411 Generalized anxiety disorder: Secondary | ICD-10-CM | POA: Diagnosis not present

## 2018-10-06 ENCOUNTER — Telehealth: Payer: Self-pay

## 2018-10-06 NOTE — Telephone Encounter (Signed)
Lisa Cole has called back and expressed interest in the PREP but would like to wait until the next session that will start in Elli/May when it stays light out longer.  She needs an evening class but as it is now, it gets dark during class and she has a hard time driving in the dark.  We will touch base in [place].

## 2018-10-06 NOTE — Telephone Encounter (Signed)
Left a VM asking if pt would like to participate in the 12-week PREP at the [facility] and to call back to let me know either way.

## 2018-10-09 ENCOUNTER — Ambulatory Visit: Payer: Self-pay | Admitting: Internal Medicine

## 2018-10-09 NOTE — Progress Notes (Deleted)
Patient ID: Lisa Cole, female   DOB: 12/26/1980, 38 y.o.   MRN: [id number]  HPI: Lisa Cole is a 38 y.o.-year-old female, referred by Dr. Zigmund Daniel for management of DM2, dx as GDM (at 38 y/o), then DM2  since 2000 insulin-dependent, uncontrolled, with complications: PN). She was previously seeing Dr. Meredith Pel and Dr. Chalmers Cater.  Last visit with me 3 weeks ago. PCP: Dr Jearld Pies + [facility]  She is frequently out of her diabetes medications and not checking sugars.  At last visits she needed surgical clearance but I could not clear her from the diabetes point of view due to very high blood sugars. Surgeon: Dr. Jodelle Gross.  She is back today to address the same problem.  Last hemoglobin A1c was: Lab Results  Component Value Date   HGBA1C 11.2 (A) 09/18/2018   HGBA1C 12.8 (A) 06/13/2018   HGBA1C 12.5 (A) 03/13/2018  11/03/2015: HbA1c 12.2% 06/03/2014: HbA1c 8.5% 11/23/2013: HbA1c 7.9%  Previously on: - Lantus 10 >> 30  >> 40-50 units at bedtime - may forget 1-2x a week; mentions occasional cough and wheezing - She stopped Januvia 100 mg in the past - She stopped Invokana (started by PCP) - mm spasms; this was retried 05/2017 >> thrush, yeast inf - Metformin IR and ER >> N/V/D/"spacing out" - Victoza >> dysphagia and rash - Actos >> no pbs   Then on: - NovoLog >> Humalog 40-60 units before meals - but itching, wheezing  She was on Lantus 40 units at bedtime >> stopped   At last visit she was on: - Jardiance 10 mg before breakfast >> leg cramps  - ran out 1 mo ago!!! - Lantus 40 units at bedtime >> ran out 1 mo ago!!!! - Humalog 15 to 20 units before meals  >> 10 units per meal!  We changed to: -Jardiance 10 mg before breakfast  -Lantus 40 units at bedtime >> off at last OV, in 08/2018>> Tresiba U200 *** units daily -Humalog 15-20 >> 12 >> 15-20 units before meals She could not tolerate Invokana because of muscle cramps.  Checking sugars 1-3 times a day per meter  download: - am: 225-347 >> 329-364 >> 292-382 - 2h after b'fast: 259, 304 >> n/c - before lunch:285 >> n/c >> 275-339 - 2h after lunch:348 >> n/c >> 312-392 - before dinner: n/c >> 322-405 - 2h after dinner: 315, 328 >> n/c >> 211-430 - bedtime: n/c >> 305-325 - nighttime: n/c Lowest sugar was 329 >> 211 >> ***; she has hypoglycemia awareness in the 70s. Highest sugar was 364 >> 430 >> ***. She was in the emergency room with hyperglycemia but without DKA in 02/2016.  Glucometer: AccuChek [employer]  She saw Antonieta Iba with nutrition in the past.  She stopped drinking sodas afterwards.  -No CKD, last BUN/creatinine:  Lab Results  Component Value Date   BUN 11 06/13/2018   CREATININE 0.67 06/13/2018  11/03/2015: 9/0.7, Glu 315, ACR 4.1 09/07/2015: ACR 15 06/03/2014: ACR <2.2 She was supposed to be on lisinopril but she is not on ACE inhibitor or ARB.  -+ HL; last set of lipids: Lab Results  Component Value Date   CHOL 141 04/10/2018   HDL 38 (L) 04/10/2018   LDLCALC 78 04/10/2018   TRIG 127 04/10/2018   CHOLHDL 3.7 04/10/2018  10/05/2016: 231/283/34/40 11/03/2015: 139/169/33/72 09/07/2015: 143/143/36/78 She is not on a statin.  - last eye exam was in 09/2014: No DR  -+ Numbness and tingling in her feet.  She is on Neurontin and was given a prescription of amitriptyline.  She has a history of thyroid nodules.  Latest thyroid ultrasound from 03/24/2014 showed no worrisome thyroid nodules, only a small 3 mm nodule in the right lobe. She apparently had another ultrasound the 11/2014, but the report is not available to me.  Latest TSH reviewed-normal: Lab Results  Component Value Date   TSH 1.19 06/13/2018  11/03/2015: TSH 0.89 11/03/2015: TSH 0.89, free T4  0.85 (0.6-1.5)  ROS: Constitutional: no weight gain/no weight loss, no fatigue, no subjective hyperthermia, no subjective hypothermia Eyes: no blurry vision, no xerophthalmia ENT: no sore throat, + see  HPI Cardiovascular: no CP/no SOB/no palpitations/no leg swelling Respiratory: no cough/no SOB/no wheezing Gastrointestinal: no N/no V/no D/no C/no acid reflux Musculoskeletal: no muscle aches/no joint aches Skin: no rashes, no hair loss Neurological: no tremors/+ numbness/= tingling/no dizziness  I reviewed pt's medications, allergies, PMH, social hx, family hx, and changes were documented in the history of present illness. Otherwise, unchanged from my initial visit note.  Past Medical History:  Diagnosis Date  . Anemia   . Anxiety disorder   . Arthritis   . Asthma   . Atypical chest pain 04/10/2018  . BV (bacterial vaginosis)   . Chronic pelvic pain in female   . Diabetes mellitus   . Endometriosis   . Hashimoto thyroiditis   . Hepatic steatosis   . Hypertension   . IBS (irritable bowel syndrome)   . IC (interstitial cystitis)   . Inappropriate sinus tachycardia 06/25/2018  . Obesity   . OSA (obstructive sleep apnea)   . SVT (supraventricular tachycardia) ([place])   . Type 2 diabetes mellitus ([place])    Past Surgical History:  Procedure Laterality Date  . bladder staple    . CYSTOSTOMY W/ BLADDER BIOPSY    . SHOULDER SURGERY  2008   right  . TOTAL ABDOMINAL HYSTERECTOMY  2004  . TUBAL LIGATION  2001  . WRIST SURGERY  2008   right   Social History   Social History  . Marital Status: Divorced    Spouse Name: N/A  . Number of Children: 3   Occupational History  .  retirement    Social History Main Topics  . Smoking status: Never Smoker   . Smokeless tobacco: Never Used  . Alcohol Use: No  . Drug Use: No   Social History Narrative   Exercise: yes, working out w/ Physiological scientist   Diet: incorporated more fruits and vegetables    Current Outpatient Medications on File Prior to Visit  Medication Sig Dispense Refill  . albuterol (PROVENTIL HFA;VENTOLIN HFA) 108 (90 Base) MCG/ACT inhaler Inhale 2 puffs into the lungs every 4 (four) hours as needed for wheezing or  shortness of breath.     . Blood Glucose Monitoring Suppl (ONETOUCH VERIO) w/Device KIT Use as advised 1 kit 0  . budesonide-formoterol (SYMBICORT) 160-4.5 MCG/ACT inhaler Inhale 2 puffs into the lungs 2 (two) times daily. 1 Inhaler 6  . Continuous Blood Gluc Sensor (FREESTYLE LIBRE 14 DAY SENSOR) MISC 1 each by Does not apply route every 14 (fourteen) days. Change every 2 weeks 2 each 11  . diltiazem (CARDIZEM) 30 MG tablet Take 1 tablet (30 mg total) by mouth 2 (two) times daily. 180 tablet 1  . empagliflozin (JARDIANCE) 10 MG TABS tablet Take 10 mg by mouth daily. 90 tablet 3  . gabapentin (NEURONTIN) 300 MG capsule Take I capsule qd for 1 week, then take 1  capsule bid for 1 week, then take 1 capsule tid    . glucose blood test strip Use as instructed 3x a day - One Touch Verio 200 each 11  . Insulin Degludec (TRESIBA FLEXTOUCH) 200 UNIT/ML SOPN Inject 40 Units into the skin daily. 6 pen 5  . insulin lispro (HUMALOG KWIKPEN) 100 UNIT/ML KiwkPen INJECT 15 TO 20 UNITS UNDER THE SKIN THREE TIMES DAILY BEFORE A MEAL 54 mL 0  . Insulin Pen Needle (BD PEN NEEDLE NANO 2ND GEN) 32G X 4 MM MISC Use to inject insulin. 200 each 2  . Lancet Devices (ONE TOUCH DELICA LANCING DEV) MISC Use to check sugar 2 times daily. 200 each 3  . omeprazole (PRILOSEC) 20 MG capsule Take 1 capsule by mouth daily.     No current facility-administered medications on file prior to visit.    Allergies  Allergen Reactions  . Liraglutide Hives, Itching and Other (See Comments)    "Burning sensation" "Burning sensation"  . Codeine Nausea Only  . Acetaminophen Other (See Comments)    Advised not to take due to peptic ulcer  . Ibuprofen Other (See Comments)    Advised not to take due to peptic ulcer  . Amoxicillin Swelling and Rash    Has patient had a PCN reaction causing immediate rash, facial/tongue/throat swelling, SOB or lightheadedness with hypotension:yes Has patient had a PCN reaction causing severe rash involving  mucus membranes or skin necrosis: no Has patient had a PCN reaction that required hospitalization: yes Has patient had a PCN reaction occurring within the last 10 years: no If all of the above answers are "NO", then may proceed with Cephalosporin use.   . Metformin Diarrhea    "GI upset even with Metformin XR"  . Penicillins Swelling and Rash    Has patient had a PCN reaction causing immediate rash, facial/tongue/throat swelling, SOB or lightheadedness with hypotension: Yes Has patient had a PCN reaction causing severe rash involving mucus membranes or skin necrosis: No Has patient had a PCN reaction that required hospitalization Yes Has patient had a PCN reaction occurring within the last 10 years: No If all of the above answers are "NO", then may proceed with Cephalosporin use.    Family History  Problem Relation Age of Onset  . Diabetes Mother   . Hypertension Father   . Heart attack Father   . Diabetes Father   . Alcohol abuse Unknown   . Hypertension Unknown   . Hyperlipidemia Unknown   . Kidney disease Unknown   . Colon cancer Unknown        uncle  . Prostate cancer Unknown        uncles and grandfather  . Cirrhosis Unknown        both grandmother and grandfather  . Lung cancer Unknown   . Heart disease Unknown   . Thyroid disease Unknown   . Colon polyps Unknown   . Liver disease Unknown   . Arthritis Maternal Grandmother   . Heart failure Maternal Grandmother   . Kidney failure Maternal Grandmother   . Diabetes Daughter        type 2  . Hypertension Daughter   . Psoriasis Sister   . Arthritis Sister   . Lung cancer Maternal Grandfather   . Heart disease Maternal Grandfather   . Diabetes Paternal Grandmother   . Glaucoma Paternal Grandmother   . Hypertension Paternal Grandmother   . Heart failure Paternal Grandmother   . Diabetes Paternal Grandfather   .  Hypertension Paternal Grandfather    PE: There were no vitals taken for this visit. There is no height  or weight on file to calculate BMI.  Wt Readings from Last 3 Encounters:  09/18/18 149 lb (67.6 kg)  06/25/18 149 lb (67.6 kg)  06/13/18 148 lb (67.1 kg)   Constitutional: overweight, in NAD Eyes: PERRLA, EOMI, no exophthalmos ENT: moist mucous membranes, no thyromegaly, no cervical lymphadenopathy Cardiovascular: RRR, No MRG Respiratory: CTA B Gastrointestinal: abdomen soft, NT, ND, BS+ Musculoskeletal: no deformities, strength intact in all 4 Skin: moist, warm, no rashes Neurological: no tremor with outstretched hands, DTR normal in all 4  ASSESSMENT: 1. DM2, insulin-dependent, uncontrolled, without complications  2.  History of thyroid nodules  3.  Hashimoto's thyroiditis  PLAN:  1. Patient with longstanding, uncontrolled, type 2 diabetes, usually noncompliant with visits and medications.  She continues to have a poor diet, consisting mostly of fast foods.  We discussed at length at every visit about the absolute need to improve her diet.  At last visit, she was not not on long-acting insulin and we added [place].  I gave her a sample pen.  At that time she was also taking lower doses of Humalog than prescribed.  We increased these.  We again discussed at length about the need to take both short and long-acting insulin to have a chance to bring her diabetes under control.  She was telling me in the past that she had burning and itching after injection of Lantus.  At that time, HbA1c was very high, at 11.2% so I could not clear her for surgery.  She is back now approximately 3 weeks later to re-address clearance for surgery.  - I suggested to:  Patient Instructions  Please continue: - Jardiance 10 mg before breakfast  - Tresiba U200 *** units daily - Humalog 15-20 units before each meal  Please return in 3 months with your sugar log.   - today, HbA1c is 7%  - continue checking sugars at different times of the day - check 4x a day, rotating checks - advised for yearly eye exams  >> she is not UTD - Return to clinic in 3 mo with sugar log      2.  History of thyroid nodules -No neck compression symptoms except mild dysphagia which is not new -Reviewed the report of her latest thyroid ultrasound: She only had a small 3 mm thyroid nodule -We discussed that no follow-up is needed for this unless she starts developing neck compression symptoms.  3.  Hashimoto's thyroiditis -You stopped -Not on levothyroxine yet -Latest TSH reviewed: Normal in 05/2018   [name], MD PhD [facility]

## 2018-10-10 ENCOUNTER — Ambulatory Visit (INDEPENDENT_AMBULATORY_CARE_PROVIDER_SITE_OTHER): Payer: Medicare Other | Admitting: Psychology

## 2018-10-10 DIAGNOSIS — F331 Major depressive disorder, recurrent, moderate: Secondary | ICD-10-CM

## 2018-10-10 DIAGNOSIS — F411 Generalized anxiety disorder: Secondary | ICD-10-CM | POA: Diagnosis not present

## 2018-10-15 ENCOUNTER — Ambulatory Visit (INDEPENDENT_AMBULATORY_CARE_PROVIDER_SITE_OTHER): Payer: Medicare Other | Admitting: Psychology

## 2018-10-15 DIAGNOSIS — F411 Generalized anxiety disorder: Secondary | ICD-10-CM

## 2018-10-15 DIAGNOSIS — F331 Major depressive disorder, recurrent, moderate: Secondary | ICD-10-CM

## 2018-10-16 ENCOUNTER — Encounter: Payer: Self-pay | Admitting: Internal Medicine

## 2018-10-16 ENCOUNTER — Ambulatory Visit (INDEPENDENT_AMBULATORY_CARE_PROVIDER_SITE_OTHER): Payer: Medicare Other | Admitting: Internal Medicine

## 2018-10-16 VITALS — BP 118/70 | HR 96 | Ht 60.0 in | Wt 147.0 lb

## 2018-10-16 DIAGNOSIS — Z794 Long term (current) use of insulin: Secondary | ICD-10-CM

## 2018-10-16 DIAGNOSIS — Z8639 Personal history of other endocrine, nutritional and metabolic disease: Secondary | ICD-10-CM | POA: Diagnosis not present

## 2018-10-16 DIAGNOSIS — E063 Autoimmune thyroiditis: Secondary | ICD-10-CM

## 2018-10-16 DIAGNOSIS — E1165 Type 2 diabetes mellitus with hyperglycemia: Secondary | ICD-10-CM | POA: Diagnosis not present

## 2018-10-16 MED ORDER — INSULIN DEGLUDEC 200 UNIT/ML ~~LOC~~ SOPN: 40.0000 [IU] | PEN_INJECTOR | Freq: Every day | SUBCUTANEOUS | 5 refills | Status: DC

## 2018-10-16 MED ORDER — EMPAGLIFLOZIN 10 MG PO TABS: 10.0000 mg | ORAL_TABLET | Freq: Every day | ORAL | 3 refills | Status: DC

## 2018-10-16 MED ORDER — INSULIN LISPRO 200 UNIT/ML ~~LOC~~ SOPN: 15.0000 [IU] | PEN_INJECTOR | Freq: Three times a day (TID) | SUBCUTANEOUS | 3 refills | Status: DC

## 2018-10-16 NOTE — Patient Instructions (Addendum)
Please schedule an appt with Lisa Cole with nutrition.  Please increase: Lisa Cole to 40 units daily  Please add: - Jardiance 10 mg daily before breakfast  Continue: - Humalog  15-20 units before meals  Please call and schedule an eye appt with Dr. Prudencio Burly: [name]:  Dr. Sherlyn Lick MD ?  Address: [place], [place], [place] [address]  [phone]  Please return in [name] with your meter.

## 2018-10-16 NOTE — Progress Notes (Signed)
Patient ID: Lisa Cole, female   DOB: 1981-03-30, 38 y.o.   MRN: [id number]  HPI: Lisa Cole is a 38 y.o.-year-old female, referred by Dr. Zigmund Daniel for management of DM2, dx as GDM (at 38 y/o), then DM2  since 2000 insulin-dependent, uncontrolled, with complications: PN). She was previously seeing Dr. Meredith Pel and Dr. Chalmers Cater.  Last visit with me 4 weeks ago. PCP: Dr Jearld Pies + [facility]  She is frequently out of her diabetes medications and not checking sugars.  At last visit, she needed surgical clearance but I could not clear her from the diabetes point of view due to very high blood sugars.  She is back today to address the same problem. Surgeon: Dr. Jodelle Gross.    Patient tells me that she is in counseling and sees her counselor every week.  She is now in a better place accepting her diabetes diagnosis and would like to start improving her diabetes control.  Last hemoglobin A1c was: Lab Results  Component Value Date   HGBA1C 11.2 (A) 09/18/2018   HGBA1C 12.8 (A) 06/13/2018   HGBA1C 12.5 (A) 03/13/2018  11/03/2015: HbA1c 12.2% 06/03/2014: HbA1c 8.5% 11/23/2013: HbA1c 7.9%  Previously on: - Lantus 10 >> 30  >> 40-50 units at bedtime - may forget 1-2x a week; mentions occasional cough and wheezing - She stopped Januvia 100 mg in the past - She stopped Invokana (started by PCP) - mm spasms; this was retried 05/2017 >> thrush, yeast inf - Metformin IR and ER >> N/V/D/"spacing out" - Victoza >> dysphagia and rash - Actos >> no pbs   Then on: - NovoLog >> Humalog 40-60 units before meals - but itching, wheezing  She was on Lantus 40 units at bedtime >> stopped   Is been on: - Jardiance 10 mg before breakfast >> leg cramps  - ran out  - Lantus 40 units at bedtime >> ran out - Humalog 15 to 20 units before meals  >> 10 units per meal!  We changed to:  Did not start as she did not get this from the pharmacy -Lantus 40 units at bedtime >> off at last OV, in 08/2018>>  Tresiba U200  20 units daily (did not increase to 40 units as advised at last visit) -Humalog 15-20 >> 12 >> 15-20 units before meals She could not tolerate Invokana because of muscle cramps.  Checking sugars 1-3 times a day per meter download: - am: 225-347 >> 329-364 >> 292-382 >> 280-344 - 2h after b'fast: 259, 304 >> n/c - before lunch:285 >> n/c >> 275-339 >> 232-301 - 2h after lunch:348 >> n/c >> 312-392 >> 295, 329 - before dinner: n/c >> 322-405 >> 192, 280-327 - 2h after dinner: 315, 328 >> n/c >> 211-430 >> n/c - bedtime: n/c >> 305-325 >> n/c - nighttime: n/c Lowest sugar was 329 >> 211 >> 192; she has hypoglycemia awareness in the 70s. Highest sugar was 364 >> 430 >> 344. She was in the emergency room with hyperglycemia but without DKA in 02/2016.  Glucometer: AccuChek Aviva  She saw Antonieta Iba with nutrition in the past.    -No CKD, last BUN/creatinine:  Lab Results  Component Value Date   BUN 11 06/13/2018   CREATININE 0.67 06/13/2018  11/03/2015: 9/0.7, Glu 315, ACR 4.1 09/07/2015: ACR 15 06/03/2014: ACR <2.2 She was supposed to be on lisinopril but she is not taking it.  -+ HL; last set of lipids: Lab Results  Component Value Date  CHOL 141 04/10/2018   HDL 38 (L) 04/10/2018   LDLCALC 78 04/10/2018   TRIG 127 04/10/2018   CHOLHDL 3.7 04/10/2018  10/05/2016: 231/283/34/40 11/03/2015: 139/169/33/72 09/07/2015: 143/143/36/78 She is not on a statin.  - last eye exam was in 09/2014: No DR  -+ Numbness and tingling in her feet.  She is on Neurontin and was given a prescription of amitriptyline and she took.  She has a history of thyroid nodules.  Thyroid ultrasound (03/24/2014) showed no worrisome thyroid nodules, only a small 3 mm nodule in the right lobe. She apparently had another ultrasound the 11/2014, but the report is not available to me.  Latest TSH was reviewed and this was normal: Lab Results  Component Value Date   TSH 1.19 06/13/2018   11/03/2015: TSH 0.89 11/03/2015: TSH 0.89, free T4  0.85 (0.6-1.5)  ROS: Constitutional: no weight gain/no weight loss, + fatigue, no subjective hyperthermia, no subjective hypothermia Eyes: + Blurry vision, no xerophthalmia ENT: no sore throat, + see HPI Cardiovascular: + CP/no SOB/+ palpitations/no leg swelling Respiratory: no cough/no SOB/+ wheezing Gastrointestinal: no N/no V/no D/no C/no acid reflux Musculoskeletal: + Muscle aches/+ joint aches Skin: no rashes, no hair loss Neurological: no tremors/+ numbness/+ tingling/no dizziness, + chronic headaches  I reviewed pt's medications, allergies, PMH, social hx, family hx, and changes were documented in the history of present illness. Otherwise, unchanged from my initial visit note.  Past Medical History:  Diagnosis Date  . Anemia   . Anxiety disorder   . Arthritis   . Asthma   . Atypical chest pain 04/10/2018  . BV (bacterial vaginosis)   . Chronic pelvic pain in female   . Diabetes mellitus   . Endometriosis   . Hashimoto thyroiditis   . Hepatic steatosis   . Hypertension   . IBS (irritable bowel syndrome)   . IC (interstitial cystitis)   . Inappropriate sinus tachycardia 06/25/2018  . Obesity   . OSA (obstructive sleep apnea)   . SVT (supraventricular tachycardia) ([place])   . Type 2 diabetes mellitus ([place])    Past Surgical History:  Procedure Laterality Date  . bladder staple    . CYSTOSTOMY W/ BLADDER BIOPSY    . SHOULDER SURGERY  2008   right  . TOTAL ABDOMINAL HYSTERECTOMY  2004  . TUBAL LIGATION  2001  . WRIST SURGERY  2008   right   Social History   Social History  . Marital Status: Divorced    Spouse Name: N/A  . Number of Children: 3   Occupational History  .  retirement    Social History Main Topics  . Smoking status: Never Smoker   . Smokeless tobacco: Never Used  . Alcohol Use: No  . Drug Use: No   Social History Narrative   Exercise: yes, working out w/ Physiological scientist   Diet:  incorporated more fruits and vegetables    Current Outpatient Medications on File Prior to Visit  Medication Sig Dispense Refill  . albuterol (PROVENTIL HFA;VENTOLIN HFA) 108 (90 Base) MCG/ACT inhaler Inhale 2 puffs into the lungs every 4 (four) hours as needed for wheezing or shortness of breath.     . Blood Glucose Monitoring Suppl (ONETOUCH VERIO) w/Device KIT Use as advised 1 kit 0  . budesonide-formoterol (SYMBICORT) 160-4.5 MCG/ACT inhaler Inhale 2 puffs into the lungs 2 (two) times daily. 1 Inhaler 6  . Continuous Blood Gluc Sensor (FREESTYLE LIBRE 14 DAY SENSOR) MISC 1 each by Does not apply route every 14 (  fourteen) days. Change every 2 weeks 2 each 11  . diltiazem (CARDIZEM) 30 MG tablet Take 1 tablet (30 mg total) by mouth 2 (two) times daily. 180 tablet 1  . empagliflozin (JARDIANCE) 10 MG TABS tablet Take 10 mg by mouth daily. 90 tablet 3  . gabapentin (NEURONTIN) 300 MG capsule Take I capsule qd for 1 week, then take 1 capsule bid for 1 week, then take 1 capsule tid    . glucose blood test strip Use as instructed 3x a day - One Touch Verio 200 each 11  . Insulin Degludec (TRESIBA FLEXTOUCH) 200 UNIT/ML SOPN Inject 40 Units into the skin daily. 6 pen 5  . insulin lispro (HUMALOG KWIKPEN) 100 UNIT/ML KiwkPen INJECT 15 TO 20 UNITS UNDER THE SKIN THREE TIMES DAILY BEFORE A MEAL 54 mL 0  . Insulin Pen Needle (BD PEN NEEDLE NANO 2ND GEN) 32G X 4 MM MISC Use to inject insulin. 200 each 2  . Lancet Devices (ONE TOUCH DELICA LANCING DEV) MISC Use to check sugar 2 times daily. 200 each 3  . omeprazole (PRILOSEC) 20 MG capsule Take 1 capsule by mouth daily.     No current facility-administered medications on file prior to visit.    Allergies  Allergen Reactions  . Liraglutide Hives, Itching and Other (See Comments)    "Burning sensation" "Burning sensation"  . Codeine Nausea Only  . Acetaminophen Other (See Comments)    Advised not to take due to peptic ulcer  . Ibuprofen Other (See  Comments)    Advised not to take due to peptic ulcer  . Amoxicillin Swelling and Rash    Has patient had a PCN reaction causing immediate rash, facial/tongue/throat swelling, SOB or lightheadedness with hypotension:yes Has patient had a PCN reaction causing severe rash involving mucus membranes or skin necrosis: no Has patient had a PCN reaction that required hospitalization: yes Has patient had a PCN reaction occurring within the last 10 years: no If all of the above answers are "NO", then may proceed with Cephalosporin use.   . Metformin Diarrhea    "GI upset even with Metformin XR"  . Penicillins Swelling and Rash    Has patient had a PCN reaction causing immediate rash, facial/tongue/throat swelling, SOB or lightheadedness with hypotension: Yes Has patient had a PCN reaction causing severe rash involving mucus membranes or skin necrosis: No Has patient had a PCN reaction that required hospitalization Yes Has patient had a PCN reaction occurring within the last 10 years: No If all of the above answers are "NO", then may proceed with Cephalosporin use.    Family History  Problem Relation Age of Onset  . Diabetes Mother   . Hypertension Father   . Heart attack Father   . Diabetes Father   . Alcohol abuse Unknown   . Hypertension Unknown   . Hyperlipidemia Unknown   . Kidney disease Unknown   . Colon cancer Unknown        uncle  . Prostate cancer Unknown        uncles and grandfather  . Cirrhosis Unknown        both grandmother and grandfather  . Lung cancer Unknown   . Heart disease Unknown   . Thyroid disease Unknown   . Colon polyps Unknown   . Liver disease Unknown   . Arthritis Maternal Grandmother   . Heart failure Maternal Grandmother   . Kidney failure Maternal Grandmother   . Diabetes Daughter  type 2  . Hypertension Daughter   . Psoriasis Sister   . Arthritis Sister   . Lung cancer Maternal Grandfather   . Heart disease Maternal Grandfather   .  Diabetes Paternal Grandmother   . Glaucoma Paternal Grandmother   . Hypertension Paternal Grandmother   . Heart failure Paternal Grandmother   . Diabetes Paternal Grandfather   . Hypertension Paternal Grandfather    PE: BP 118/70   Pulse 96   Ht 5' (1.524 m)   Wt 147 lb (66.7 kg)   SpO2 97%   BMI 28.71 kg/m  Body mass index is 28.71 kg/m.  Wt Readings from Last 3 Encounters:  10/16/18 147 lb (66.7 kg)  09/18/18 149 lb (67.6 kg)  06/25/18 149 lb (67.6 kg)   Constitutional: overweight, in NAD Eyes: PERRLA, EOMI, no exophthalmos ENT: moist mucous membranes, no thyromegaly, no cervical lymphadenopathy Cardiovascular: tachycardia, RR, No MRG Respiratory: CTA B Gastrointestinal: abdomen soft, NT, ND, BS+ Musculoskeletal: no deformities, strength intact in all 4 Skin: moist, warm, no rashes Neurological: no tremor with outstretched hands, DTR normal in all 4  ASSESSMENT: 1. DM2, insulin-dependent, uncontrolled, without complications  2.  History of thyroid nodules  3.  Hashimoto's thyroiditis  PLAN:  1. Patient with longstanding, uncontrolled, type 2 diabetes, usually noncompliant with visits and medications.  He continues to have a poor diet, consisting mostly of fast foods.  We discussed at length at the visit about the absolute need to improve her diet.  At last visit, she was not on long-acting insulin and we added [place].  I gave her a sample pen.  At that time, she was also taking lower doses of Humalog and prescribed.  I advised her to increase these.  We again discussed at length about the need to take both short and long-acting insulin to have a chance to bring her diabetes under control.  She was telling me that in the past she had burning and itching after injection of Lantus, therefore, we started [place].   - At last visit, HbA1c was very high, at 11.2% so I could not clear her for surgery.  She is back now approximately 4 weeks later is to be addressed clearance for  surgery. - At this visit, sugars are only slightly improved from before.  She did not start Jardiance or increase the [place] dose as advised.  Given her another [place] pen and also written prescription for [place].  She will go to another pharmacy. -She is also trying to adjust her diet.  She is interested in seeing nutrition-referral placed. -[facility]  Discussed about the absolute need to stay very well hydrated, especially with such high sugars and since we are starting SGLT2 inhibitor. - I suggested to:  Patient Instructions  Please schedule an appt with Antonieta Iba with nutrition.  Please increase: Tyler Aas to 40 units daily  Please add: - Jardiance 10 mg daily before breakfast  Continue: - Humalog  15-20 units before meals  Please call and schedule an eye appt with Dr. Prudencio Burly: [name]:  Dr. Sherlyn Lick MD ?  Address: [address], [place], [place] [address]  [phone]  Please return in [place] with your meter.   - continue checking sugars at different times of the day - check 1x a day, rotating checks - advised for yearly eye exams >> she is not UTD-given again Dr. Prudencio Burly contact information - Return to clinic in 2  mo with sugar log       2.  History of thyroid nodules -No neck compression symptoms except mild dysphagia which is not new -Reviewed her latest thyroid ultrasound report, she only had a 3 mm thyroid nodule -We discussed that no follow-up is needed for this unless she starts developing neck compression symptoms.  3.  Hashimoto's thyroiditis -Euthyroid -Not on levothyroxine yet -Latest TSH reviewed: Normal 05/2018  [name], MD PhD [facility] Endocrinology

## 2018-10-24 ENCOUNTER — Ambulatory Visit (INDEPENDENT_AMBULATORY_CARE_PROVIDER_SITE_OTHER): Payer: Medicare Other | Admitting: Psychology

## 2018-10-24 DIAGNOSIS — F331 Major depressive disorder, recurrent, moderate: Secondary | ICD-10-CM | POA: Diagnosis not present

## 2018-10-24 DIAGNOSIS — F411 Generalized anxiety disorder: Secondary | ICD-10-CM | POA: Diagnosis not present

## 2018-10-28 ENCOUNTER — Ambulatory Visit (INDEPENDENT_AMBULATORY_CARE_PROVIDER_SITE_OTHER): Payer: Medicare Other | Admitting: Psychology

## 2018-10-28 DIAGNOSIS — F411 Generalized anxiety disorder: Secondary | ICD-10-CM | POA: Diagnosis not present

## 2018-10-28 DIAGNOSIS — F331 Major depressive disorder, recurrent, moderate: Secondary | ICD-10-CM | POA: Diagnosis not present

## 2018-11-05 ENCOUNTER — Ambulatory Visit: Payer: Medicare Other | Admitting: Psychology

## 2018-11-07 ENCOUNTER — Encounter (HOSPITAL_COMMUNITY): Payer: Self-pay | Admitting: Emergency Medicine

## 2018-11-07 ENCOUNTER — Emergency Department (HOSPITAL_COMMUNITY)
Admission: EM | Admit: 2018-11-07 | Discharge: 2018-11-07 | Disposition: A | Discharge status: Home / Self Care | Payer: Medicare Other | Attending: Emergency Medicine | Admitting: Emergency Medicine

## 2018-11-07 DIAGNOSIS — S0003XA Contusion of scalp, initial encounter: Secondary | ICD-10-CM | POA: Diagnosis not present

## 2018-11-07 DIAGNOSIS — S161XXA Strain of muscle, fascia and tendon at neck level, initial encounter: Secondary | ICD-10-CM | POA: Diagnosis not present

## 2018-11-07 DIAGNOSIS — Z794 Long term (current) use of insulin: Secondary | ICD-10-CM | POA: Insufficient documentation

## 2018-11-07 DIAGNOSIS — S199XXA Unspecified injury of neck, initial encounter: Secondary | ICD-10-CM | POA: Diagnosis present

## 2018-11-07 DIAGNOSIS — Y92410 Unspecified street and highway as the place of occurrence of the external cause: Secondary | ICD-10-CM | POA: Insufficient documentation

## 2018-11-07 DIAGNOSIS — Y9389 Activity, other specified: Secondary | ICD-10-CM | POA: Diagnosis not present

## 2018-11-07 DIAGNOSIS — Y999 Unspecified external cause status: Secondary | ICD-10-CM | POA: Insufficient documentation

## 2018-11-07 DIAGNOSIS — J45909 Unspecified asthma, uncomplicated: Secondary | ICD-10-CM | POA: Insufficient documentation

## 2018-11-07 DIAGNOSIS — E119 Type 2 diabetes mellitus without complications: Secondary | ICD-10-CM | POA: Diagnosis not present

## 2018-11-07 DIAGNOSIS — Z79899 Other long term (current) drug therapy: Secondary | ICD-10-CM | POA: Diagnosis not present

## 2018-11-07 DIAGNOSIS — I10 Essential (primary) hypertension: Secondary | ICD-10-CM | POA: Insufficient documentation

## 2018-11-07 DIAGNOSIS — E063 Autoimmune thyroiditis: Secondary | ICD-10-CM | POA: Diagnosis not present

## 2018-11-07 MED ORDER — METHOCARBAMOL 500 MG PO TABS: 500.0000 mg | ORAL_TABLET | Freq: Two times a day (BID) | ORAL | 0 refills | Status: DC
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
## 2018-11-07 NOTE — Discharge Instructions (Addendum)
Return if any problems.
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
## 2018-11-10 ENCOUNTER — Telehealth: Payer: Self-pay | Admitting: Cardiovascular Disease

## 2018-11-10 NOTE — Telephone Encounter (Signed)
° ° °  Patient calling to request a prescription for blood pressure monitor

## 2018-11-10 NOTE — Telephone Encounter (Signed)
Pt calling for a prescription for blood pressure machine. Will route message to Nurse.

## 2018-11-10 NOTE — Telephone Encounter (Signed)
Left message to call back  

## 2018-11-13 DIAGNOSIS — G5620 Lesion of ulnar nerve, unspecified upper limb: Secondary | ICD-10-CM | POA: Insufficient documentation

## 2018-11-13 NOTE — Telephone Encounter (Signed)
Pt called returning [place] Call.

## 2018-11-13 NOTE — Telephone Encounter (Signed)
Returned call to patient she stated she needs a prescription from [facility] to buy a wrist B/P cuff.Advised Dr.Little [place] and her nurse are out of office today.I will send message to [place].

## 2018-11-14 MED ORDER — BLOOD PRESSURE MONITORING KIT: PACK | 0 refills | Status: AC

## 2018-11-14 NOTE — Telephone Encounter (Signed)
rx sent to pharmacy as requested. Advised patient wrist cuff not recommended, verbalized understanding.

## 2018-11-20 ENCOUNTER — Ambulatory Visit (INDEPENDENT_AMBULATORY_CARE_PROVIDER_SITE_OTHER): Payer: Medicare Other | Admitting: Psychology

## 2018-11-20 DIAGNOSIS — F411 Generalized anxiety disorder: Secondary | ICD-10-CM

## 2018-11-20 DIAGNOSIS — F331 Major depressive disorder, recurrent, moderate: Secondary | ICD-10-CM

## 2018-11-25 DIAGNOSIS — G44319 Acute post-traumatic headache, not intractable: Secondary | ICD-10-CM | POA: Insufficient documentation

## 2018-11-25 DIAGNOSIS — S060X9A Concussion with loss of consciousness of unspecified duration, initial encounter: Secondary | ICD-10-CM | POA: Insufficient documentation

## 2018-11-26 ENCOUNTER — Ambulatory Visit (INDEPENDENT_AMBULATORY_CARE_PROVIDER_SITE_OTHER): Payer: Medicare Other | Admitting: Psychology

## 2018-11-26 DIAGNOSIS — F3110 Bipolar disorder, current episode manic without psychotic features, unspecified: Secondary | ICD-10-CM

## 2018-11-26 DIAGNOSIS — F411 Generalized anxiety disorder: Secondary | ICD-10-CM | POA: Diagnosis not present

## 2018-12-01 ENCOUNTER — Telehealth: Payer: Self-pay
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
## 2018-12-02 ENCOUNTER — Other Ambulatory Visit (INDEPENDENT_AMBULATORY_CARE_PROVIDER_SITE_OTHER): Payer: Medicare Other

## 2018-12-02 ENCOUNTER — Ambulatory Visit: Payer: Medicare Other | Admitting: Family Medicine

## 2018-12-02 VITALS — BP 102/64 | HR 91 | Ht 60.0 in | Wt 156.0 lb

## 2018-12-02 DIAGNOSIS — M2550 Pain in unspecified joint: Secondary | ICD-10-CM

## 2018-12-02 DIAGNOSIS — G44321 Chronic post-traumatic headache, intractable: Secondary | ICD-10-CM

## 2018-12-02 DIAGNOSIS — R51 Headache: Secondary | ICD-10-CM

## 2018-12-02 DIAGNOSIS — R519 Headache, unspecified: Secondary | ICD-10-CM

## 2018-12-02 DIAGNOSIS — R42 Dizziness and giddiness: Secondary | ICD-10-CM | POA: Diagnosis not present

## 2018-12-02 DIAGNOSIS — G8929 Other chronic pain: Secondary | ICD-10-CM

## 2018-12-02 LAB — SEDIMENTATION RATE: SED RATE: 26 mm/h — AB (ref 0–20)

## 2018-12-02 LAB — CBC WITH DIFFERENTIAL/PLATELET
Basophils Absolute: 0 10*3/uL (ref 0.0–0.1)
Basophils Relative: 0.8 % (ref 0.0–3.0)
Eosinophils Absolute: 0.1 10*3/uL (ref 0.0–0.7)
Eosinophils Relative: 1.3 % (ref 0.0–5.0)
HCT: 42.4 % (ref 36.0–46.0)
Hemoglobin: 14.6 g/dL (ref 12.0–15.0)
LYMPHS PCT: 40.3 % (ref 12.0–46.0)
Lymphs Abs: 2.2 10*3/uL (ref 0.7–4.0)
MCHC: 34.4 g/dL (ref 30.0–36.0)
MCV: 89.1 fl (ref 78.0–100.0)
Monocytes Absolute: 0.3 10*3/uL (ref 0.1–1.0)
Monocytes Relative: 5 % (ref 3.0–12.0)
Neutro Abs: 2.9 10*3/uL (ref 1.4–7.7)
Neutrophils Relative %: 52.6 % (ref 43.0–77.0)
Platelets: 225 10*3/uL (ref 150.0–400.0)
RBC: 4.75 Mil/uL (ref 3.87–5.11)
RDW: 12.7 % (ref 11.5–15.5)
WBC: 5.5 10*3/uL (ref 4.0–10.5)

## 2018-12-02 LAB — IBC PANEL
IRON: 61 ug/dL (ref 42–145)
Saturation Ratios: 15.8 % — ABNORMAL LOW (ref 20.0–50.0)
TRANSFERRIN: 275 mg/dL (ref 212.0–360.0)

## 2018-12-02 LAB — COMPREHENSIVE METABOLIC PANEL
ALK PHOS: 44 U/L (ref 39–117)
ALT: 16 U/L (ref 0–35)
AST: 15 U/L (ref 0–37)
Albumin: 4.6 g/dL (ref 3.5–5.2)
BUN: 11 mg/dL (ref 6–23)
CO2: 26 mEq/L (ref 19–32)
Calcium: 10.1 mg/dL (ref 8.4–10.5)
Chloride: 102 mEq/L (ref 96–112)
Creatinine, Ser: 0.7 mg/dL (ref 0.40–1.20)
GFR: 112.8 mL/min (ref >60.00)
Glucose, Bld: 173 mg/dL — ABNORMAL HIGH (ref 70–99)
POTASSIUM: 3.7 meq/L (ref 3.5–5.1)
Sodium: 140 mEq/L (ref 135–145)
Total Bilirubin: 0.6 mg/dL (ref 0.2–1.2)
Total Protein: 7.7 g/dL (ref 6.0–8.3)

## 2018-12-02 LAB — FERRITIN: Ferritin: 172.5 ng/mL (ref 10.0–291.0)

## 2018-12-02 LAB — URIC ACID: Uric Acid, Serum: 6.5 mg/dL (ref 2.4–7.0)

## 2018-12-02 LAB — C-REACTIVE PROTEIN: CRP: <1 mg/dL (ref 0.5–20.0)

## 2018-12-02 LAB — TSH: TSH: 0.73 u[IU]/mL (ref 0.35–4.50)

## 2018-12-02 LAB — VITAMIN D 25 HYDROXY (VIT D DEFICIENCY, FRACTURES): VITD: 14.04 ng/mL — AB (ref 30.00–100.00)

## 2018-12-02 LAB — HEMOGLOBIN A1C: Hgb A1c MFr Bld: 10.5 % — ABNORMAL HIGH (ref 4.6–6.5)
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
## 2018-12-02 NOTE — Patient Instructions (Signed)
Good to see you  I would like MRI and the labs  Labs downstairs today  MRI- they will call you  Vitamin D 2000 IU daily  CoQ10 200mg  daily  PT will be calling you as well  See me again in 3-5 weeks but we will be talking before that
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
## 2018-12-04 LAB — ANGIOTENSIN CONVERTING ENZYME: Angiotensin-Converting Enzyme: 33 U/L (ref 9–67)

## 2018-12-04 LAB — PAIN MGMT, PROFILE 8 W/CONF, U
6 ACETYLMORPHINE: NEGATIVE ng/mL (ref <10)
ALCOHOL METABOLITES: NEGATIVE ng/mL (ref <500)
AMPHETAMINES: NEGATIVE ng/mL (ref <500)
BUPRENORPHINE, URINE: NEGATIVE ng/mL (ref <5)
Benzodiazepines: NEGATIVE ng/mL (ref <100)
CREATININE: 59.6 mg/dL
Cocaine Metabolite: NEGATIVE ng/mL (ref <150)
MDMA: NEGATIVE ng/mL (ref <500)
Marijuana Metabolite: NEGATIVE ng/mL (ref <20)
Opiates: NEGATIVE ng/mL (ref <100)
Oxidant: NEGATIVE ug/mL (ref <200)
Oxycodone: NEGATIVE ng/mL (ref <100)
PH: 5.51 (ref 4.5–9.0)

## 2018-12-04 LAB — PTH, INTACT AND CALCIUM
Calcium: 10.3 mg/dL — ABNORMAL HIGH (ref 8.6–10.2)
PTH: 52 pg/mL (ref 14–64)

## 2018-12-04 LAB — CALCIUM, IONIZED: Calcium, Ion: 5.32 mg/dL (ref 4.8–5.6)

## 2018-12-04 LAB — CYCLIC CITRUL PEPTIDE ANTIBODY, IGG: Cyclic Citrullin Peptide Ab: <16 UNITS

## 2018-12-04 LAB — ANA: Anti Nuclear Antibody(ANA): NEGATIVE

## 2018-12-04 LAB — RHEUMATOID FACTOR

## 2018-12-05 ENCOUNTER — Ambulatory Visit (INDEPENDENT_AMBULATORY_CARE_PROVIDER_SITE_OTHER): Payer: Medicare Other | Admitting: Psychology

## 2018-12-05 ENCOUNTER — Other Ambulatory Visit: Payer: Self-pay

## 2018-12-05 DIAGNOSIS — F411 Generalized anxiety disorder: Secondary | ICD-10-CM

## 2018-12-05 DIAGNOSIS — F331 Major depressive disorder, recurrent, moderate: Secondary | ICD-10-CM

## 2018-12-09 ENCOUNTER — Other Ambulatory Visit: Payer: Self-pay

## 2018-12-09 ENCOUNTER — Ambulatory Visit (INDEPENDENT_AMBULATORY_CARE_PROVIDER_SITE_OTHER): Payer: Medicare Other | Admitting: Psychology

## 2018-12-09 DIAGNOSIS — F411 Generalized anxiety disorder: Secondary | ICD-10-CM | POA: Diagnosis not present

## 2018-12-09 DIAGNOSIS — F331 Major depressive disorder, recurrent, moderate: Secondary | ICD-10-CM | POA: Diagnosis not present

## 2018-12-12 ENCOUNTER — Ambulatory Visit: Payer: Medicare Other | Admitting: Psychology

## 2018-12-16 ENCOUNTER — Encounter: Payer: Self-pay | Admitting: Neurology

## 2018-12-16 ENCOUNTER — Ambulatory Visit (INDEPENDENT_AMBULATORY_CARE_PROVIDER_SITE_OTHER): Payer: Medicare Other | Admitting: Psychology

## 2018-12-16 ENCOUNTER — Ambulatory Visit
Admission: RE | Admit: 2018-12-16 | Discharge: 2018-12-16 | Disposition: A | Discharge status: Home / Self Care | Payer: Medicare Other | Source: Ambulatory Visit | Attending: Family Medicine | Admitting: Family Medicine

## 2018-12-16 ENCOUNTER — Other Ambulatory Visit: Payer: Self-pay | Admitting: *Deleted

## 2018-12-16 ENCOUNTER — Other Ambulatory Visit: Payer: Self-pay

## 2018-12-16 DIAGNOSIS — R51 Headache: Principal | ICD-10-CM

## 2018-12-16 DIAGNOSIS — F331 Major depressive disorder, recurrent, moderate: Secondary | ICD-10-CM | POA: Diagnosis not present

## 2018-12-16 DIAGNOSIS — G9389 Other specified disorders of brain: Secondary | ICD-10-CM

## 2018-12-16 DIAGNOSIS — R519 Headache, unspecified: Secondary | ICD-10-CM

## 2018-12-16 DIAGNOSIS — F411 Generalized anxiety disorder: Secondary | ICD-10-CM | POA: Diagnosis not present

## 2018-12-16 DIAGNOSIS — G8929 Other chronic pain: Secondary | ICD-10-CM

## 2018-12-16 MED ORDER — GADOBENATE DIMEGLUMINE 529 MG/ML IV SOLN
14.0000 mL | Freq: Once | INTRAVENOUS | Status: AC | PRN
  Administered 2018-12-16: 14 mL via INTRAVENOUS

## 2018-12-18 ENCOUNTER — Ambulatory Visit (INDEPENDENT_AMBULATORY_CARE_PROVIDER_SITE_OTHER): Payer: Medicare Other | Admitting: Psychology

## 2018-12-18 DIAGNOSIS — F331 Major depressive disorder, recurrent, moderate: Secondary | ICD-10-CM

## 2018-12-18 DIAGNOSIS — F4323 Adjustment disorder with mixed anxiety and depressed mood: Secondary | ICD-10-CM | POA: Diagnosis not present

## 2018-12-18 DIAGNOSIS — F411 Generalized anxiety disorder: Secondary | ICD-10-CM

## 2018-12-23 ENCOUNTER — Other Ambulatory Visit: Payer: Self-pay | Admitting: Neurological Surgery

## 2018-12-23 ENCOUNTER — Ambulatory Visit: Payer: Self-pay | Admitting: Family Medicine

## 2018-12-23 DIAGNOSIS — G939 Disorder of brain, unspecified: Secondary | ICD-10-CM | POA: Insufficient documentation

## 2018-12-24 ENCOUNTER — Ambulatory Visit (INDEPENDENT_AMBULATORY_CARE_PROVIDER_SITE_OTHER): Payer: Medicare Other | Admitting: Psychology

## 2018-12-24 DIAGNOSIS — F411 Generalized anxiety disorder: Secondary | ICD-10-CM | POA: Diagnosis not present

## 2018-12-24 DIAGNOSIS — F331 Major depressive disorder, recurrent, moderate: Secondary | ICD-10-CM

## 2018-12-26 ENCOUNTER — Encounter: Payer: Self-pay | Admitting: Internal Medicine

## 2018-12-26 ENCOUNTER — Ambulatory Visit (INDEPENDENT_AMBULATORY_CARE_PROVIDER_SITE_OTHER): Payer: Medicare Other | Admitting: Internal Medicine

## 2018-12-26 ENCOUNTER — Other Ambulatory Visit: Payer: Self-pay

## 2018-12-26 VITALS — BP 118/74 | HR 85 | Temp 98.3°F | Wt 150.0 lb

## 2018-12-26 DIAGNOSIS — Z8639 Personal history of other endocrine, nutritional and metabolic disease: Secondary | ICD-10-CM | POA: Diagnosis not present

## 2018-12-26 DIAGNOSIS — E559 Vitamin D deficiency, unspecified: Secondary | ICD-10-CM | POA: Diagnosis not present

## 2018-12-26 DIAGNOSIS — E1165 Type 2 diabetes mellitus with hyperglycemia: Secondary | ICD-10-CM

## 2018-12-26 DIAGNOSIS — Z794 Long term (current) use of insulin: Secondary | ICD-10-CM

## 2018-12-26 DIAGNOSIS — E063 Autoimmune thyroiditis: Secondary | ICD-10-CM

## 2018-12-26 MED ORDER — ERGOCALCIFEROL 1.25 MG (50000 UT) PO CAPS: 50000.0000 [IU] | ORAL_CAPSULE | ORAL | 3 refills | Status: AC

## 2018-12-26 MED ORDER — EMPAGLIFLOZIN 25 MG PO TABS: 25.0000 mg | ORAL_TABLET | Freq: Every day | ORAL | 3 refills | Status: DC

## 2018-12-26 MED ORDER — INSULIN LISPRO 200 UNIT/ML ~~LOC~~ SOPN: 20.0000 [IU] | PEN_INJECTOR | Freq: Three times a day (TID) | SUBCUTANEOUS | 3 refills | Status: DC

## 2018-12-26 MED ORDER — INSULIN NPH (HUMAN) (ISOPHANE) 100 UNIT/ML ~~LOC~~ SUSP: SUBCUTANEOUS | 11 refills | Status: DC

## 2018-12-26 NOTE — Patient Instructions (Addendum)
Please continue: - Jardiance 20-25 mg daily before breakfast - Humalog  20-25 units before meals  Please change from [place] to NPH: - 20 units 2x a day: in am and at bedtime  Start Ergocalciferol 50,000 units once a  Week.  Please call and schedule an eye appt with Dr. Prudencio Burly: [facility]:  Dr. Sherlyn Lick MD ?  Address: [address], [place], [place] [address]  [phone]  Please return in 3 months with your meter or sugar log.
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
## 2018-12-31 ENCOUNTER — Ambulatory Visit (INDEPENDENT_AMBULATORY_CARE_PROVIDER_SITE_OTHER): Payer: Medicare Other | Admitting: Psychology

## 2018-12-31 DIAGNOSIS — F4323 Adjustment disorder with mixed anxiety and depressed mood: Secondary | ICD-10-CM | POA: Diagnosis not present

## 2018-12-31 DIAGNOSIS — F331 Major depressive disorder, recurrent, moderate: Secondary | ICD-10-CM | POA: Diagnosis not present

## 2018-12-31 DIAGNOSIS — F411 Generalized anxiety disorder: Secondary | ICD-10-CM | POA: Diagnosis not present

## 2019-01-07 ENCOUNTER — Ambulatory Visit (INDEPENDENT_AMBULATORY_CARE_PROVIDER_SITE_OTHER): Payer: Medicare Other | Admitting: Psychology

## 2019-01-07 DIAGNOSIS — F4323 Adjustment disorder with mixed anxiety and depressed mood: Secondary | ICD-10-CM

## 2019-01-07 DIAGNOSIS — F411 Generalized anxiety disorder: Secondary | ICD-10-CM

## 2019-01-07 DIAGNOSIS — F331 Major depressive disorder, recurrent, moderate: Secondary | ICD-10-CM

## 2019-01-12 ENCOUNTER — Ambulatory Visit: Payer: Medicare Other | Admitting: Psychology

## 2019-01-15 ENCOUNTER — Ambulatory Visit (INDEPENDENT_AMBULATORY_CARE_PROVIDER_SITE_OTHER): Payer: Medicare Other | Admitting: Psychology

## 2019-01-15 DIAGNOSIS — F331 Major depressive disorder, recurrent, moderate: Secondary | ICD-10-CM

## 2019-01-15 DIAGNOSIS — F411 Generalized anxiety disorder: Secondary | ICD-10-CM | POA: Diagnosis not present

## 2019-01-19 ENCOUNTER — Ambulatory Visit (INDEPENDENT_AMBULATORY_CARE_PROVIDER_SITE_OTHER): Payer: Medicare Other | Admitting: Psychology

## 2019-01-19 DIAGNOSIS — F411 Generalized anxiety disorder: Secondary | ICD-10-CM

## 2019-01-19 DIAGNOSIS — F4323 Adjustment disorder with mixed anxiety and depressed mood: Secondary | ICD-10-CM

## 2019-01-19 DIAGNOSIS — F331 Major depressive disorder, recurrent, moderate: Secondary | ICD-10-CM | POA: Diagnosis not present

## 2019-01-27 ENCOUNTER — Inpatient Hospital Stay: Admit: 2019-01-27 | Payer: Medicare Other | Admitting: Neurological Surgery

## 2019-01-27 SURGERY — SUBOCCIPITAL CRANIECTOMY CERVICAL LAMINECTOMY/DURAPLASTY
Anesthesia: General

## 2019-01-28 ENCOUNTER — Ambulatory Visit (INDEPENDENT_AMBULATORY_CARE_PROVIDER_SITE_OTHER): Payer: Medicare Other | Admitting: Psychology

## 2019-01-28 DIAGNOSIS — F331 Major depressive disorder, recurrent, moderate: Secondary | ICD-10-CM

## 2019-01-28 DIAGNOSIS — F411 Generalized anxiety disorder: Secondary | ICD-10-CM

## 2019-01-28 DIAGNOSIS — F4323 Adjustment disorder with mixed anxiety and depressed mood: Secondary | ICD-10-CM

## 2019-02-02 ENCOUNTER — Ambulatory Visit (INDEPENDENT_AMBULATORY_CARE_PROVIDER_SITE_OTHER): Payer: Medicare Other | Admitting: Psychology

## 2019-02-02 DIAGNOSIS — F331 Major depressive disorder, recurrent, moderate: Secondary | ICD-10-CM

## 2019-02-02 DIAGNOSIS — F411 Generalized anxiety disorder: Secondary | ICD-10-CM

## 2019-02-10 ENCOUNTER — Other Ambulatory Visit: Payer: Self-pay | Admitting: Internal Medicine

## 2019-02-10 ENCOUNTER — Encounter: Payer: Self-pay | Admitting: Internal Medicine

## 2019-02-10 DIAGNOSIS — E559 Vitamin D deficiency, unspecified: Secondary | ICD-10-CM

## 2019-02-11 ENCOUNTER — Other Ambulatory Visit: Payer: Self-pay

## 2019-02-11 ENCOUNTER — Other Ambulatory Visit (INDEPENDENT_AMBULATORY_CARE_PROVIDER_SITE_OTHER): Payer: Medicare Other

## 2019-02-11 DIAGNOSIS — E559 Vitamin D deficiency, unspecified: Secondary | ICD-10-CM | POA: Diagnosis not present

## 2019-02-11 LAB — VITAMIN D 25 HYDROXY (VIT D DEFICIENCY, FRACTURES): VITD: 42.44 ng/mL (ref 30.00–100.00)

## 2019-02-12 ENCOUNTER — Ambulatory Visit (INDEPENDENT_AMBULATORY_CARE_PROVIDER_SITE_OTHER): Payer: Medicare Other | Admitting: Psychology

## 2019-02-12 DIAGNOSIS — F411 Generalized anxiety disorder: Secondary | ICD-10-CM | POA: Diagnosis not present

## 2019-02-12 DIAGNOSIS — F331 Major depressive disorder, recurrent, moderate: Secondary | ICD-10-CM

## 2019-02-17 ENCOUNTER — Ambulatory Visit (INDEPENDENT_AMBULATORY_CARE_PROVIDER_SITE_OTHER): Payer: Medicare Other | Admitting: Psychology

## 2019-02-17 DIAGNOSIS — F331 Major depressive disorder, recurrent, moderate: Secondary | ICD-10-CM | POA: Diagnosis not present

## 2019-02-17 DIAGNOSIS — F411 Generalized anxiety disorder: Secondary | ICD-10-CM

## 2019-02-19 ENCOUNTER — Telehealth: Payer: Self-pay | Admitting: Dietician

## 2019-02-19 ENCOUNTER — Encounter: Payer: Medicare Other | Attending: Internal Medicine | Admitting: Dietician

## 2019-02-19 ENCOUNTER — Other Ambulatory Visit: Payer: Self-pay

## 2019-02-19 ENCOUNTER — Encounter: Payer: Self-pay | Admitting: Dietician

## 2019-02-19 DIAGNOSIS — E1165 Type 2 diabetes mellitus with hyperglycemia: Secondary | ICD-10-CM | POA: Insufficient documentation

## 2019-02-19 DIAGNOSIS — Z794 Long term (current) use of insulin: Secondary | ICD-10-CM | POA: Insufficient documentation
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
## 2019-02-22 NOTE — Patient Instructions (Addendum)
Increase your fruits and vegetables.   Avoid skipping meals. Choose beverages without carbohydrates most of the time. Stay active- consider youtube walking classes Continue to care for you. Resume your NPH 20 units in the morning and before bed. Continue your other medication and insulin as prescribed.

## 2019-02-24 ENCOUNTER — Other Ambulatory Visit: Payer: Self-pay

## 2019-02-24 ENCOUNTER — Other Ambulatory Visit: Payer: Self-pay | Admitting: Family Medicine

## 2019-02-24 ENCOUNTER — Ambulatory Visit
Admission: RE | Admit: 2019-02-24 | Discharge: 2019-02-24 | Disposition: A | Discharge status: Home / Self Care | Payer: Medicare Other | Source: Ambulatory Visit | Attending: Family Medicine | Admitting: Family Medicine

## 2019-02-24 DIAGNOSIS — M25541 Pain in joints of right hand: Secondary | ICD-10-CM

## 2019-02-24 DIAGNOSIS — M25542 Pain in joints of left hand: Secondary | ICD-10-CM

## 2019-02-25 ENCOUNTER — Ambulatory Visit (INDEPENDENT_AMBULATORY_CARE_PROVIDER_SITE_OTHER): Payer: Medicare Other | Admitting: Psychology

## 2019-02-25 DIAGNOSIS — F331 Major depressive disorder, recurrent, moderate: Secondary | ICD-10-CM | POA: Diagnosis not present

## 2019-02-25 DIAGNOSIS — F411 Generalized anxiety disorder: Secondary | ICD-10-CM

## 2019-02-27 ENCOUNTER — Ambulatory Visit: Payer: Self-pay | Admitting: Neurology

## 2019-03-02 ENCOUNTER — Encounter: Payer: Self-pay | Admitting: Internal Medicine

## 2019-03-03 ENCOUNTER — Ambulatory Visit (INDEPENDENT_AMBULATORY_CARE_PROVIDER_SITE_OTHER): Payer: Medicare Other | Admitting: Psychology

## 2019-03-03 DIAGNOSIS — F411 Generalized anxiety disorder: Secondary | ICD-10-CM

## 2019-03-03 DIAGNOSIS — F331 Major depressive disorder, recurrent, moderate: Secondary | ICD-10-CM

## 2019-03-04 ENCOUNTER — Other Ambulatory Visit: Payer: Self-pay | Admitting: Family Medicine

## 2019-03-04 DIAGNOSIS — E049 Nontoxic goiter, unspecified: Secondary | ICD-10-CM

## 2019-03-05 ENCOUNTER — Encounter: Payer: Self-pay | Admitting: Neurology
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
## 2019-03-06 ENCOUNTER — Other Ambulatory Visit: Payer: Self-pay

## 2019-03-06 ENCOUNTER — Encounter: Payer: Self-pay | Admitting: Neurology

## 2019-03-06 ENCOUNTER — Telehealth (INDEPENDENT_AMBULATORY_CARE_PROVIDER_SITE_OTHER): Payer: Medicare Other | Admitting: Neurology

## 2019-03-06 VITALS — Ht 60.0 in | Wt 152.0 lb

## 2019-03-06 DIAGNOSIS — G44309 Post-traumatic headache, unspecified, not intractable: Secondary | ICD-10-CM

## 2019-03-06 DIAGNOSIS — F0781 Postconcussional syndrome: Secondary | ICD-10-CM

## 2019-03-06 DIAGNOSIS — D496 Neoplasm of unspecified behavior of brain: Secondary | ICD-10-CM

## 2019-03-06 DIAGNOSIS — F419 Anxiety disorder, unspecified: Secondary | ICD-10-CM

## 2019-03-06 MED ORDER — NORTRIPTYLINE HCL 10 MG PO CAPS: ORAL_CAPSULE | ORAL | 0 refills | Status: DC

## 2019-03-10 ENCOUNTER — Ambulatory Visit: Payer: Medicare Other | Admitting: Psychology

## 2019-03-11 ENCOUNTER — Ambulatory Visit (INDEPENDENT_AMBULATORY_CARE_PROVIDER_SITE_OTHER): Payer: Medicare Other | Admitting: Psychology

## 2019-03-11 DIAGNOSIS — F331 Major depressive disorder, recurrent, moderate: Secondary | ICD-10-CM

## 2019-03-11 DIAGNOSIS — F411 Generalized anxiety disorder: Secondary | ICD-10-CM

## 2019-03-16 ENCOUNTER — Ambulatory Visit
Admission: RE | Admit: 2019-03-16 | Discharge: 2019-03-16 | Disposition: A | Discharge status: Home / Self Care | Payer: Medicare Other | Source: Ambulatory Visit | Attending: Family Medicine | Admitting: Family Medicine

## 2019-03-16 ENCOUNTER — Other Ambulatory Visit: Payer: Self-pay

## 2019-03-16 ENCOUNTER — Other Ambulatory Visit: Payer: Medicare Other

## 2019-03-16 DIAGNOSIS — E049 Nontoxic goiter, unspecified: Secondary | ICD-10-CM

## 2019-03-18 ENCOUNTER — Ambulatory Visit: Payer: Medicare Other | Admitting: Psychology

## 2019-03-18 ENCOUNTER — Ambulatory Visit (INDEPENDENT_AMBULATORY_CARE_PROVIDER_SITE_OTHER): Payer: Medicare Other | Admitting: Psychology

## 2019-03-18 DIAGNOSIS — F411 Generalized anxiety disorder: Secondary | ICD-10-CM | POA: Diagnosis not present

## 2019-03-18 DIAGNOSIS — F331 Major depressive disorder, recurrent, moderate: Secondary | ICD-10-CM | POA: Diagnosis not present

## 2019-03-23 ENCOUNTER — Ambulatory Visit (INDEPENDENT_AMBULATORY_CARE_PROVIDER_SITE_OTHER): Payer: Medicare Other | Admitting: Psychology

## 2019-03-23 DIAGNOSIS — F331 Major depressive disorder, recurrent, moderate: Secondary | ICD-10-CM

## 2019-03-23 DIAGNOSIS — F411 Generalized anxiety disorder: Secondary | ICD-10-CM

## 2019-04-01 ENCOUNTER — Other Ambulatory Visit: Payer: Self-pay

## 2019-04-03 ENCOUNTER — Other Ambulatory Visit: Payer: Self-pay

## 2019-04-03 ENCOUNTER — Ambulatory Visit (INDEPENDENT_AMBULATORY_CARE_PROVIDER_SITE_OTHER): Payer: Medicare Other | Admitting: Psychology

## 2019-04-03 ENCOUNTER — Encounter: Payer: Self-pay | Admitting: Internal Medicine

## 2019-04-03 ENCOUNTER — Ambulatory Visit (INDEPENDENT_AMBULATORY_CARE_PROVIDER_SITE_OTHER): Payer: Medicare Other | Admitting: Internal Medicine

## 2019-04-03 VITALS — BP 110/70 | HR 92 | Ht 60.0 in | Wt 147.0 lb

## 2019-04-03 DIAGNOSIS — E063 Autoimmune thyroiditis: Secondary | ICD-10-CM

## 2019-04-03 DIAGNOSIS — Z8639 Personal history of other endocrine, nutritional and metabolic disease: Secondary | ICD-10-CM

## 2019-04-03 DIAGNOSIS — Z794 Long term (current) use of insulin: Secondary | ICD-10-CM

## 2019-04-03 DIAGNOSIS — E1165 Type 2 diabetes mellitus with hyperglycemia: Secondary | ICD-10-CM | POA: Diagnosis not present

## 2019-04-03 DIAGNOSIS — F331 Major depressive disorder, recurrent, moderate: Secondary | ICD-10-CM

## 2019-04-03 DIAGNOSIS — E559 Vitamin D deficiency, unspecified: Secondary | ICD-10-CM

## 2019-04-03 DIAGNOSIS — F411 Generalized anxiety disorder: Secondary | ICD-10-CM

## 2019-04-03 LAB — POCT GLYCOSYLATED HEMOGLOBIN (HGB A1C): Hemoglobin A1C: 9 % — AB (ref 4.0–5.6)

## 2019-04-03 MED ORDER — INSULIN NPH (HUMAN) (ISOPHANE) 100 UNIT/ML ~~LOC~~ SUSP: 30.0000 [IU] | Freq: Two times a day (BID) | SUBCUTANEOUS | 11 refills | Status: DC

## 2019-04-03 MED ORDER — HUMALOG KWIKPEN 200 UNIT/ML ~~LOC~~ SOPN: 25.0000 [IU] | PEN_INJECTOR | Freq: Three times a day (TID) | SUBCUTANEOUS | 3 refills | Status: DC

## 2019-04-03 NOTE — Patient Instructions (Addendum)
Please change: - Humalog  25-30 units before meals - NPH 30 units 2x a day, in a.m. and at bedtime  Restart ergocalciferol 50,000 units once a week.  Please call and schedule an eye appt with Dr. Prudencio Burly: [facility]:  Dr. Sherlyn Lick MD ?  Address: [address], [place], [place] [address]  [phone]  Please return in 3 months with your meter or sugar log.
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
## 2019-04-09 ENCOUNTER — Ambulatory Visit (INDEPENDENT_AMBULATORY_CARE_PROVIDER_SITE_OTHER): Payer: Medicare Other | Admitting: Psychology

## 2019-04-09 DIAGNOSIS — F331 Major depressive disorder, recurrent, moderate: Secondary | ICD-10-CM | POA: Diagnosis not present

## 2019-04-09 DIAGNOSIS — F411 Generalized anxiety disorder: Secondary | ICD-10-CM | POA: Diagnosis not present

## 2019-04-14 ENCOUNTER — Ambulatory Visit (INDEPENDENT_AMBULATORY_CARE_PROVIDER_SITE_OTHER): Payer: Medicare Other | Admitting: Psychology

## 2019-04-14 DIAGNOSIS — F411 Generalized anxiety disorder: Secondary | ICD-10-CM | POA: Diagnosis not present

## 2019-04-14 DIAGNOSIS — F331 Major depressive disorder, recurrent, moderate: Secondary | ICD-10-CM | POA: Diagnosis not present

## 2019-04-17 ENCOUNTER — Ambulatory Visit: Payer: Medicare Other | Admitting: Psychology

## 2019-04-24 ENCOUNTER — Ambulatory Visit: Payer: Medicare Other | Admitting: Psychology

## 2019-05-26 NOTE — Telephone Encounter (Signed)
error 

## 2019-06-15 ENCOUNTER — Ambulatory Visit (INDEPENDENT_AMBULATORY_CARE_PROVIDER_SITE_OTHER): Payer: Medicare Other | Admitting: Psychology

## 2019-06-15 DIAGNOSIS — F331 Major depressive disorder, recurrent, moderate: Secondary | ICD-10-CM

## 2019-06-15 DIAGNOSIS — F411 Generalized anxiety disorder: Secondary | ICD-10-CM

## 2019-06-22 ENCOUNTER — Ambulatory Visit (INDEPENDENT_AMBULATORY_CARE_PROVIDER_SITE_OTHER): Payer: Medicare Other | Admitting: Psychology

## 2019-06-22 DIAGNOSIS — F331 Major depressive disorder, recurrent, moderate: Secondary | ICD-10-CM

## 2019-06-22 DIAGNOSIS — F411 Generalized anxiety disorder: Secondary | ICD-10-CM

## 2019-06-30 ENCOUNTER — Ambulatory Visit (INDEPENDENT_AMBULATORY_CARE_PROVIDER_SITE_OTHER): Payer: Medicare Other | Admitting: Psychology

## 2019-06-30 DIAGNOSIS — F411 Generalized anxiety disorder: Secondary | ICD-10-CM

## 2019-06-30 DIAGNOSIS — F331 Major depressive disorder, recurrent, moderate: Secondary | ICD-10-CM | POA: Diagnosis not present

## 2019-07-03 ENCOUNTER — Other Ambulatory Visit: Payer: Self-pay

## 2019-07-03 ENCOUNTER — Encounter: Payer: Medicare Other | Attending: Family Medicine | Admitting: *Deleted

## 2019-07-03 DIAGNOSIS — E1165 Type 2 diabetes mellitus with hyperglycemia: Secondary | ICD-10-CM | POA: Diagnosis present

## 2019-07-03 DIAGNOSIS — Z794 Long term (current) use of insulin: Secondary | ICD-10-CM | POA: Insufficient documentation

## 2019-07-03 NOTE — Progress Notes (Signed)
Diabetes Self-Management Education  Visit Type:  Follow-up  Appt. Start Time: 1100 Appt. End Time: 1230  07/03/2019  Lisa Cole, identified by name and date of birth, is a 38 y.o. female with a diagnosis of Diabetes:  .  Patient has had recent brain surgery for a tumor. Diabetes control has been poor prior to that. She states she has difficulty swallowing since she had the surgery earlier this year and gags easily even on smooth foods. Smells and excess salt taste also affect whether she can tolerate a food or not. For a while after the surgery, since she couldn't eat normal foods, she was eating candy all day to prevent low blood sugars. She is now able to eat some simple foods she gets from restaurants and can cook some meals in her Instant Pot.  She also states that she was on Jardiance recently and that her BG control improved. She ran out about 2 weeks ago and reports her BG have increased to the 200-300 mg/dl range again.   ASSESSMENT  There were no vitals taken for this visit. There is no height or weight on file to calculate BMI.   Diabetes Self-Management Education - 07/03/19 1507      Psychosocial Assessment   Patient Belief/Attitude about Diabetes  Other (comment)   historically not compliant, interested in doing better now   Self-care barriers  Debilitated state due to current medical condition    Patient Concerns  Nutrition/Meal planning;Other (comment)   difficulty with texture and smell of foods since brain surgery   Special Needs  Simplified materials    Preferred Learning Style  Auditory;Visual;Hands on    Learning Readiness  Contemplating      Pre-Education Assessment   Patient understands the diabetes disease and treatment process.  Needs Review    Patient understands incorporating nutritional management into lifestyle.  Needs Instruction    Patient undertands incorporating physical activity into lifestyle.  Needs Review   activity limited due to post surgery  recovery   Patient understands using medications safely.  Needs Review    Patient understands monitoring blood glucose, interpreting and using results  Needs Review    Patient understands prevention, detection, and treatment of acute complications.  Needs Review    Patient understands prevention, detection, and treatment of chronic complications.  Needs Review    Patient understands how to develop strategies to address psychosocial issues.  Needs Review    Patient understands how to develop strategies to promote health/change behavior.  Needs Review      Complications   Last HgB A1C per patient/outside source  8.7 %    Fasting Blood glucose range (mg/dL)  >200    Postprandial Blood glucose range (mg/dL)  >200;180-200;130-179    Number of hypoglycemic episodes per month  3      Dietary Intake   Breakfast  can eat eggs    Lunch  has tried tuna and some burger meat lately. Has been able to eat some eggs with rice. Gags easily, doesn't always know what she will be able to tolerate.      Exercise   Exercise Type  ADL's      Patient Education   Previous Diabetes Education  Yes (please comment)   but since her brain surgery, she doesn't remember what she learned before   Nutrition management   Carbohydrate counting;Information on hints to eating out and maintain blood glucose control.   ideas for freezing meals to save for another day  Individualized Goals (developed by patient)   Nutrition  General guidelines for healthy choices and portions discussed    Medications  take my medication as prescribed    Monitoring   test blood glucose pre and post meals as discussed      Outcomes   Program Status  Not Completed      Subsequent Visit   Since your last visit have you continued or begun to take your medications as prescribed?  Yes    Since your last visit have you experienced any weight changes?  Loss    Since your last visit, are you checking your blood glucose at least once a day?   Yes       Learning Objective:  Patient will have a greater understanding of diabetes self-management. Patient education plan is to attend individual and/or group sessions per assessed needs and concerns.  Plan:   Patient Instructions  Plan:  Aim for 2 Carb Choices per meal (30 grams) +/- 1 either way  Aim for 0-1 Carbs per snack if hungry  Include protein in moderation with your meals and snacks Consider freezing your foods like rice or noodles so you can have meals later in the week that you heat in the microwave You can microwave a potato if you poke it with a fork first Continue checking BG at alternate times per day  Consider taking medication as directed by MD  Expected Outcomes:  Demonstrated interest in learning. Expect positive outcomes  Education material provided: Meal plan card, Simplified Carb Counting Sheet customized for the foods she is able to eat.   If problems or questions, patient to contact team via:  Phone  Future DSME appointment: - 4-6 wks

## 2019-07-03 NOTE — Patient Instructions (Signed)
Plan:  Aim for 2 Carb Choices per meal (30 grams) +/- 1 either way  Aim for 0-1 Carbs per snack if hungry  Include protein in moderation with your meals and snacks Consider freezing your foods like rice or noodles so you can have meals later in the week that you heat in the microwave You can microwave a potato if you poke it with a fork first Continue checking BG at alternate times per day  Consider taking medication as directed by MD

## 2019-07-06 ENCOUNTER — Other Ambulatory Visit: Payer: Self-pay

## 2019-07-07 ENCOUNTER — Ambulatory Visit (INDEPENDENT_AMBULATORY_CARE_PROVIDER_SITE_OTHER): Payer: Medicare Other | Admitting: Psychology

## 2019-07-07 DIAGNOSIS — F411 Generalized anxiety disorder: Secondary | ICD-10-CM | POA: Diagnosis not present

## 2019-07-07 DIAGNOSIS — F331 Major depressive disorder, recurrent, moderate: Secondary | ICD-10-CM | POA: Diagnosis not present

## 2019-07-08 ENCOUNTER — Ambulatory Visit: Payer: Medicare Other | Admitting: Internal Medicine

## 2019-07-10 ENCOUNTER — Telehealth: Payer: Self-pay

## 2019-07-10 NOTE — Telephone Encounter (Signed)
Patient called yesterday to be scheduled with Dr. Tamala Julian. Per Dr. Tamala Julian patient can schedule virtual visit as patient would like to be referred again for physical therapy. Left patient message to call back for virtual visit.

## 2019-07-29 ENCOUNTER — Ambulatory Visit (INDEPENDENT_AMBULATORY_CARE_PROVIDER_SITE_OTHER): Payer: Medicare Other | Admitting: Psychology

## 2019-07-29 DIAGNOSIS — F331 Major depressive disorder, recurrent, moderate: Secondary | ICD-10-CM

## 2019-07-29 DIAGNOSIS — F411 Generalized anxiety disorder: Secondary | ICD-10-CM | POA: Diagnosis not present

## 2019-08-05 ENCOUNTER — Ambulatory Visit (INDEPENDENT_AMBULATORY_CARE_PROVIDER_SITE_OTHER): Payer: Medicare Other | Admitting: Psychology

## 2019-08-05 ENCOUNTER — Ambulatory Visit: Payer: Medicare Other | Admitting: Internal Medicine

## 2019-08-05 DIAGNOSIS — F411 Generalized anxiety disorder: Secondary | ICD-10-CM

## 2019-08-05 DIAGNOSIS — F331 Major depressive disorder, recurrent, moderate: Secondary | ICD-10-CM | POA: Diagnosis not present

## 2019-08-10 ENCOUNTER — Ambulatory Visit: Payer: Medicare Other | Admitting: Family Medicine

## 2019-08-11 ENCOUNTER — Ambulatory Visit (INDEPENDENT_AMBULATORY_CARE_PROVIDER_SITE_OTHER): Payer: Medicare Other | Admitting: Psychology

## 2019-08-11 ENCOUNTER — Encounter: Payer: Self-pay | Admitting: Family Medicine

## 2019-08-11 DIAGNOSIS — F331 Major depressive disorder, recurrent, moderate: Secondary | ICD-10-CM

## 2019-08-11 DIAGNOSIS — F411 Generalized anxiety disorder: Secondary | ICD-10-CM

## 2019-08-13 ENCOUNTER — Other Ambulatory Visit: Payer: Self-pay

## 2019-08-13 DIAGNOSIS — R42 Dizziness and giddiness: Secondary | ICD-10-CM

## 2019-08-17 ENCOUNTER — Ambulatory Visit (INDEPENDENT_AMBULATORY_CARE_PROVIDER_SITE_OTHER): Payer: Medicare Other | Admitting: Psychology

## 2019-08-17 DIAGNOSIS — F411 Generalized anxiety disorder: Secondary | ICD-10-CM

## 2019-08-17 DIAGNOSIS — F331 Major depressive disorder, recurrent, moderate: Secondary | ICD-10-CM | POA: Diagnosis not present

## 2019-08-24 ENCOUNTER — Ambulatory Visit: Payer: Medicare Other | Admitting: Psychology

## 2019-08-27 ENCOUNTER — Ambulatory Visit (INDEPENDENT_AMBULATORY_CARE_PROVIDER_SITE_OTHER): Payer: Medicare Other | Admitting: Psychology

## 2019-08-27 DIAGNOSIS — F411 Generalized anxiety disorder: Secondary | ICD-10-CM

## 2019-08-27 DIAGNOSIS — F331 Major depressive disorder, recurrent, moderate: Secondary | ICD-10-CM | POA: Diagnosis not present

## 2019-09-04 ENCOUNTER — Ambulatory Visit (INDEPENDENT_AMBULATORY_CARE_PROVIDER_SITE_OTHER): Payer: Medicare Other | Admitting: Psychology

## 2019-09-04 DIAGNOSIS — F331 Major depressive disorder, recurrent, moderate: Secondary | ICD-10-CM | POA: Diagnosis not present

## 2019-09-04 DIAGNOSIS — F411 Generalized anxiety disorder: Secondary | ICD-10-CM | POA: Diagnosis not present

## 2019-09-07 ENCOUNTER — Ambulatory Visit (INDEPENDENT_AMBULATORY_CARE_PROVIDER_SITE_OTHER): Payer: Medicare Other | Admitting: Psychology

## 2019-09-07 ENCOUNTER — Other Ambulatory Visit: Payer: Self-pay

## 2019-09-07 DIAGNOSIS — F411 Generalized anxiety disorder: Secondary | ICD-10-CM

## 2019-09-07 DIAGNOSIS — F331 Major depressive disorder, recurrent, moderate: Secondary | ICD-10-CM

## 2019-09-11 ENCOUNTER — Ambulatory Visit: Payer: Medicare Other | Admitting: Internal Medicine

## 2019-09-14 ENCOUNTER — Ambulatory Visit (INDEPENDENT_AMBULATORY_CARE_PROVIDER_SITE_OTHER): Payer: Medicare Other | Admitting: Psychology

## 2019-09-14 DIAGNOSIS — F331 Major depressive disorder, recurrent, moderate: Secondary | ICD-10-CM

## 2019-09-14 DIAGNOSIS — F411 Generalized anxiety disorder: Secondary | ICD-10-CM

## 2019-09-24 ENCOUNTER — Ambulatory Visit: Payer: Medicare Other | Admitting: Psychology

## 2019-09-29 ENCOUNTER — Ambulatory Visit (INDEPENDENT_AMBULATORY_CARE_PROVIDER_SITE_OTHER): Payer: Medicare PPO | Admitting: Psychology

## 2019-09-29 DIAGNOSIS — F331 Major depressive disorder, recurrent, moderate: Secondary | ICD-10-CM

## 2019-09-29 DIAGNOSIS — F411 Generalized anxiety disorder: Secondary | ICD-10-CM

## 2019-10-01 ENCOUNTER — Ambulatory Visit: Payer: Medicare PPO | Admitting: Psychology

## 2019-10-09 ENCOUNTER — Ambulatory Visit (INDEPENDENT_AMBULATORY_CARE_PROVIDER_SITE_OTHER): Payer: Medicare PPO | Admitting: Psychology

## 2019-10-09 DIAGNOSIS — F331 Major depressive disorder, recurrent, moderate: Secondary | ICD-10-CM

## 2019-10-09 DIAGNOSIS — F411 Generalized anxiety disorder: Secondary | ICD-10-CM

## 2019-10-12 ENCOUNTER — Other Ambulatory Visit: Payer: Self-pay

## 2019-10-13 ENCOUNTER — Encounter: Payer: Self-pay | Admitting: Internal Medicine

## 2019-10-13 ENCOUNTER — Ambulatory Visit (INDEPENDENT_AMBULATORY_CARE_PROVIDER_SITE_OTHER): Payer: Medicare PPO | Admitting: Internal Medicine

## 2019-10-13 VITALS — BP 126/80 | HR 105 | Ht 60.0 in | Wt 153.0 lb

## 2019-10-13 DIAGNOSIS — Z8639 Personal history of other endocrine, nutritional and metabolic disease: Secondary | ICD-10-CM

## 2019-10-13 DIAGNOSIS — E559 Vitamin D deficiency, unspecified: Secondary | ICD-10-CM | POA: Diagnosis not present

## 2019-10-13 DIAGNOSIS — Z794 Long term (current) use of insulin: Secondary | ICD-10-CM

## 2019-10-13 DIAGNOSIS — E038 Other specified hypothyroidism: Secondary | ICD-10-CM | POA: Diagnosis not present

## 2019-10-13 DIAGNOSIS — E063 Autoimmune thyroiditis: Secondary | ICD-10-CM

## 2019-10-13 DIAGNOSIS — E1165 Type 2 diabetes mellitus with hyperglycemia: Secondary | ICD-10-CM | POA: Diagnosis not present

## 2019-10-13 LAB — POCT GLYCOSYLATED HEMOGLOBIN (HGB A1C): Hemoglobin A1C: 8.7 % — AB (ref 4.0–5.6)

## 2019-10-13 MED ORDER — INSULIN NPH (HUMAN) (ISOPHANE) 100 UNIT/ML ~~LOC~~ SUSP: SUBCUTANEOUS | 11 refills | Status: DC

## 2019-10-13 MED ORDER — HUMALOG KWIKPEN 200 UNIT/ML ~~LOC~~ SOPN: 14.0000 [IU] | PEN_INJECTOR | Freq: Three times a day (TID) | SUBCUTANEOUS | 3 refills | Status: DC
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
## 2019-10-13 NOTE — Addendum Note (Signed)
Addended by: Cardell Peach I on: 10/13/2019 01:55 PM   Modules accepted: Orders

## 2019-10-13 NOTE — Patient Instructions (Addendum)
Please change: - Humalog:  16 units for a smaller meal  20 units for a regular meal  24 units before a larger meal - NPH 20 units in am and 28 units at bedtime  Please increase: - Steglatro to 10 mg daily in am  Please return in 3-4 months with your meter or sugar log.

## 2019-10-15 ENCOUNTER — Ambulatory Visit (INDEPENDENT_AMBULATORY_CARE_PROVIDER_SITE_OTHER): Payer: Medicare PPO | Admitting: Psychology

## 2019-10-15 DIAGNOSIS — F411 Generalized anxiety disorder: Secondary | ICD-10-CM | POA: Diagnosis not present

## 2019-10-15 DIAGNOSIS — F331 Major depressive disorder, recurrent, moderate: Secondary | ICD-10-CM

## 2019-10-21 ENCOUNTER — Ambulatory Visit (INDEPENDENT_AMBULATORY_CARE_PROVIDER_SITE_OTHER): Payer: Medicare PPO | Admitting: Psychology

## 2019-10-21 DIAGNOSIS — F411 Generalized anxiety disorder: Secondary | ICD-10-CM

## 2019-10-21 DIAGNOSIS — F331 Major depressive disorder, recurrent, moderate: Secondary | ICD-10-CM | POA: Diagnosis not present

## 2019-10-26 ENCOUNTER — Ambulatory Visit (INDEPENDENT_AMBULATORY_CARE_PROVIDER_SITE_OTHER): Payer: Medicare PPO | Admitting: Psychology

## 2019-10-26 DIAGNOSIS — F331 Major depressive disorder, recurrent, moderate: Secondary | ICD-10-CM

## 2019-10-26 DIAGNOSIS — F411 Generalized anxiety disorder: Secondary | ICD-10-CM | POA: Diagnosis not present

## 2019-11-03 ENCOUNTER — Ambulatory Visit (INDEPENDENT_AMBULATORY_CARE_PROVIDER_SITE_OTHER): Payer: Medicare PPO | Admitting: Psychology

## 2019-11-03 DIAGNOSIS — F411 Generalized anxiety disorder: Secondary | ICD-10-CM | POA: Diagnosis not present

## 2019-11-03 DIAGNOSIS — F331 Major depressive disorder, recurrent, moderate: Secondary | ICD-10-CM

## 2019-11-09 ENCOUNTER — Ambulatory Visit (INDEPENDENT_AMBULATORY_CARE_PROVIDER_SITE_OTHER): Payer: Medicare PPO | Admitting: Psychology

## 2019-11-09 DIAGNOSIS — F331 Major depressive disorder, recurrent, moderate: Secondary | ICD-10-CM | POA: Diagnosis not present

## 2019-11-09 DIAGNOSIS — F411 Generalized anxiety disorder: Secondary | ICD-10-CM | POA: Diagnosis not present

## 2019-11-16 ENCOUNTER — Ambulatory Visit (INDEPENDENT_AMBULATORY_CARE_PROVIDER_SITE_OTHER): Payer: Medicare PPO | Admitting: Psychology

## 2019-11-16 DIAGNOSIS — F411 Generalized anxiety disorder: Secondary | ICD-10-CM | POA: Diagnosis not present

## 2019-11-16 DIAGNOSIS — F331 Major depressive disorder, recurrent, moderate: Secondary | ICD-10-CM

## 2019-11-25 ENCOUNTER — Ambulatory Visit (INDEPENDENT_AMBULATORY_CARE_PROVIDER_SITE_OTHER): Payer: Medicare PPO | Admitting: Psychology

## 2019-11-25 DIAGNOSIS — F411 Generalized anxiety disorder: Secondary | ICD-10-CM

## 2019-11-25 DIAGNOSIS — F331 Major depressive disorder, recurrent, moderate: Secondary | ICD-10-CM

## 2019-12-03 ENCOUNTER — Other Ambulatory Visit: Payer: Self-pay | Admitting: Family Medicine

## 2019-12-03 DIAGNOSIS — Z1231 Encounter for screening mammogram for malignant neoplasm of breast: Secondary | ICD-10-CM

## 2019-12-04 ENCOUNTER — Other Ambulatory Visit: Payer: Self-pay | Admitting: Internal Medicine

## 2019-12-04 ENCOUNTER — Ambulatory Visit (INDEPENDENT_AMBULATORY_CARE_PROVIDER_SITE_OTHER): Payer: Medicare PPO | Admitting: Psychology

## 2019-12-04 DIAGNOSIS — F411 Generalized anxiety disorder: Secondary | ICD-10-CM | POA: Diagnosis not present

## 2019-12-04 DIAGNOSIS — F331 Major depressive disorder, recurrent, moderate: Secondary | ICD-10-CM

## 2019-12-11 ENCOUNTER — Ambulatory Visit (INDEPENDENT_AMBULATORY_CARE_PROVIDER_SITE_OTHER): Payer: Medicare PPO | Admitting: Psychology

## 2019-12-11 DIAGNOSIS — F331 Major depressive disorder, recurrent, moderate: Secondary | ICD-10-CM

## 2019-12-11 DIAGNOSIS — F411 Generalized anxiety disorder: Secondary | ICD-10-CM | POA: Diagnosis not present

## 2019-12-16 ENCOUNTER — Ambulatory Visit (INDEPENDENT_AMBULATORY_CARE_PROVIDER_SITE_OTHER): Payer: Medicare PPO | Admitting: Psychology

## 2019-12-16 DIAGNOSIS — F411 Generalized anxiety disorder: Secondary | ICD-10-CM | POA: Diagnosis not present

## 2019-12-16 DIAGNOSIS — F331 Major depressive disorder, recurrent, moderate: Secondary | ICD-10-CM

## 2019-12-18 ENCOUNTER — Ambulatory Visit (INDEPENDENT_AMBULATORY_CARE_PROVIDER_SITE_OTHER): Payer: Medicare PPO | Admitting: Psychology

## 2019-12-18 DIAGNOSIS — F411 Generalized anxiety disorder: Secondary | ICD-10-CM | POA: Diagnosis not present

## 2019-12-18 DIAGNOSIS — F331 Major depressive disorder, recurrent, moderate: Secondary | ICD-10-CM | POA: Diagnosis not present

## 2019-12-21 ENCOUNTER — Ambulatory Visit: Payer: Medicare PPO | Admitting: Psychology

## 2019-12-23 ENCOUNTER — Other Ambulatory Visit: Payer: Self-pay

## 2019-12-23 ENCOUNTER — Other Ambulatory Visit: Payer: Self-pay | Admitting: Family Medicine

## 2019-12-23 ENCOUNTER — Ambulatory Visit
Admission: RE | Admit: 2019-12-23 | Discharge: 2019-12-23 | Disposition: A | Discharge status: Home / Self Care | Payer: Medicare PPO | Source: Ambulatory Visit | Attending: Family Medicine | Admitting: Family Medicine

## 2019-12-23 DIAGNOSIS — N63 Unspecified lump in unspecified breast: Secondary | ICD-10-CM

## 2019-12-23 DIAGNOSIS — Z1231 Encounter for screening mammogram for malignant neoplasm of breast: Secondary | ICD-10-CM

## 2019-12-29 ENCOUNTER — Ambulatory Visit (INDEPENDENT_AMBULATORY_CARE_PROVIDER_SITE_OTHER): Payer: Medicare PPO | Admitting: Psychology

## 2019-12-29 DIAGNOSIS — F331 Major depressive disorder, recurrent, moderate: Secondary | ICD-10-CM

## 2019-12-29 DIAGNOSIS — F411 Generalized anxiety disorder: Secondary | ICD-10-CM

## 2020-01-08 ENCOUNTER — Other Ambulatory Visit: Payer: Self-pay

## 2020-01-11 ENCOUNTER — Ambulatory Visit (INDEPENDENT_AMBULATORY_CARE_PROVIDER_SITE_OTHER): Payer: Medicare PPO | Admitting: Psychology

## 2020-01-11 DIAGNOSIS — F411 Generalized anxiety disorder: Secondary | ICD-10-CM

## 2020-01-11 DIAGNOSIS — F331 Major depressive disorder, recurrent, moderate: Secondary | ICD-10-CM

## 2020-01-12 ENCOUNTER — Encounter: Payer: Self-pay | Admitting: Internal Medicine

## 2020-01-12 ENCOUNTER — Ambulatory Visit (INDEPENDENT_AMBULATORY_CARE_PROVIDER_SITE_OTHER): Payer: Medicare PPO | Admitting: Internal Medicine

## 2020-01-12 DIAGNOSIS — Z5329 Procedure and treatment not carried out because of patient's decision for other reasons: Secondary | ICD-10-CM
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
## 2020-01-13 ENCOUNTER — Ambulatory Visit
Admission: RE | Admit: 2020-01-13 | Discharge: 2020-01-13 | Disposition: A | Discharge status: Home / Self Care | Payer: Medicare PPO | Source: Ambulatory Visit | Attending: Family Medicine | Admitting: Family Medicine

## 2020-01-13 ENCOUNTER — Other Ambulatory Visit: Payer: Self-pay

## 2020-01-13 DIAGNOSIS — N63 Unspecified lump in unspecified breast: Secondary | ICD-10-CM

## 2020-01-20 ENCOUNTER — Ambulatory Visit (INDEPENDENT_AMBULATORY_CARE_PROVIDER_SITE_OTHER): Payer: Medicare PPO | Admitting: Psychology

## 2020-01-20 DIAGNOSIS — F331 Major depressive disorder, recurrent, moderate: Secondary | ICD-10-CM

## 2020-01-20 DIAGNOSIS — F411 Generalized anxiety disorder: Secondary | ICD-10-CM

## 2020-01-26 ENCOUNTER — Ambulatory Visit (INDEPENDENT_AMBULATORY_CARE_PROVIDER_SITE_OTHER): Payer: Medicare PPO | Admitting: Psychology

## 2020-01-26 DIAGNOSIS — F411 Generalized anxiety disorder: Secondary | ICD-10-CM | POA: Diagnosis not present

## 2020-01-26 DIAGNOSIS — F331 Major depressive disorder, recurrent, moderate: Secondary | ICD-10-CM

## 2020-01-29 ENCOUNTER — Ambulatory Visit: Payer: Medicare PPO | Admitting: Psychology

## 2020-02-01 ENCOUNTER — Ambulatory Visit (INDEPENDENT_AMBULATORY_CARE_PROVIDER_SITE_OTHER): Payer: Medicare PPO | Admitting: Psychology

## 2020-02-01 DIAGNOSIS — F411 Generalized anxiety disorder: Secondary | ICD-10-CM

## 2020-02-01 DIAGNOSIS — F331 Major depressive disorder, recurrent, moderate: Secondary | ICD-10-CM

## 2020-02-05 ENCOUNTER — Ambulatory Visit (INDEPENDENT_AMBULATORY_CARE_PROVIDER_SITE_OTHER): Payer: Medicare PPO | Admitting: Psychology

## 2020-02-05 DIAGNOSIS — F411 Generalized anxiety disorder: Secondary | ICD-10-CM

## 2020-02-05 DIAGNOSIS — F331 Major depressive disorder, recurrent, moderate: Secondary | ICD-10-CM

## 2020-02-08 ENCOUNTER — Ambulatory Visit (INDEPENDENT_AMBULATORY_CARE_PROVIDER_SITE_OTHER): Payer: Medicare PPO | Admitting: Psychology

## 2020-02-08 DIAGNOSIS — F411 Generalized anxiety disorder: Secondary | ICD-10-CM

## 2020-02-08 DIAGNOSIS — F331 Major depressive disorder, recurrent, moderate: Secondary | ICD-10-CM | POA: Diagnosis not present

## 2020-02-18 ENCOUNTER — Ambulatory Visit (INDEPENDENT_AMBULATORY_CARE_PROVIDER_SITE_OTHER): Payer: Medicare PPO | Admitting: Psychology

## 2020-02-18 DIAGNOSIS — F331 Major depressive disorder, recurrent, moderate: Secondary | ICD-10-CM | POA: Diagnosis not present

## 2020-02-18 DIAGNOSIS — F411 Generalized anxiety disorder: Secondary | ICD-10-CM | POA: Diagnosis not present

## 2020-02-19 ENCOUNTER — Ambulatory Visit: Payer: Medicare PPO | Admitting: Psychology

## 2020-02-23 ENCOUNTER — Ambulatory Visit: Payer: Medicare PPO | Admitting: Psychology

## 2020-02-23 ENCOUNTER — Ambulatory Visit (INDEPENDENT_AMBULATORY_CARE_PROVIDER_SITE_OTHER): Payer: Medicare PPO | Admitting: Psychology

## 2020-02-23 DIAGNOSIS — F411 Generalized anxiety disorder: Secondary | ICD-10-CM | POA: Diagnosis not present

## 2020-02-23 DIAGNOSIS — F331 Major depressive disorder, recurrent, moderate: Secondary | ICD-10-CM | POA: Diagnosis not present

## 2020-03-01 ENCOUNTER — Ambulatory Visit (INDEPENDENT_AMBULATORY_CARE_PROVIDER_SITE_OTHER): Payer: Medicare PPO | Admitting: Psychology

## 2020-03-01 DIAGNOSIS — F331 Major depressive disorder, recurrent, moderate: Secondary | ICD-10-CM | POA: Diagnosis not present

## 2020-03-01 DIAGNOSIS — F411 Generalized anxiety disorder: Secondary | ICD-10-CM | POA: Diagnosis not present

## 2020-03-04 ENCOUNTER — Ambulatory Visit (INDEPENDENT_AMBULATORY_CARE_PROVIDER_SITE_OTHER): Payer: Medicare PPO | Admitting: Psychology

## 2020-03-04 DIAGNOSIS — F331 Major depressive disorder, recurrent, moderate: Secondary | ICD-10-CM | POA: Diagnosis not present

## 2020-03-04 DIAGNOSIS — F411 Generalized anxiety disorder: Secondary | ICD-10-CM | POA: Diagnosis not present

## 2020-03-10 ENCOUNTER — Ambulatory Visit (INDEPENDENT_AMBULATORY_CARE_PROVIDER_SITE_OTHER): Payer: Medicare PPO | Admitting: Psychology

## 2020-03-10 DIAGNOSIS — F331 Major depressive disorder, recurrent, moderate: Secondary | ICD-10-CM | POA: Diagnosis not present

## 2020-03-10 DIAGNOSIS — F411 Generalized anxiety disorder: Secondary | ICD-10-CM | POA: Diagnosis not present

## 2020-03-15 ENCOUNTER — Ambulatory Visit (INDEPENDENT_AMBULATORY_CARE_PROVIDER_SITE_OTHER): Payer: Medicare PPO | Admitting: Psychology

## 2020-03-15 DIAGNOSIS — F331 Major depressive disorder, recurrent, moderate: Secondary | ICD-10-CM

## 2020-03-15 DIAGNOSIS — F411 Generalized anxiety disorder: Secondary | ICD-10-CM

## 2020-03-17 ENCOUNTER — Ambulatory Visit (INDEPENDENT_AMBULATORY_CARE_PROVIDER_SITE_OTHER): Payer: Medicare PPO | Admitting: Psychology

## 2020-03-17 DIAGNOSIS — F331 Major depressive disorder, recurrent, moderate: Secondary | ICD-10-CM | POA: Diagnosis not present

## 2020-03-17 DIAGNOSIS — F411 Generalized anxiety disorder: Secondary | ICD-10-CM

## 2020-03-22 ENCOUNTER — Ambulatory Visit (INDEPENDENT_AMBULATORY_CARE_PROVIDER_SITE_OTHER): Payer: Medicare PPO | Admitting: Psychology

## 2020-03-22 DIAGNOSIS — F331 Major depressive disorder, recurrent, moderate: Secondary | ICD-10-CM | POA: Diagnosis not present

## 2020-03-22 DIAGNOSIS — F411 Generalized anxiety disorder: Secondary | ICD-10-CM

## 2020-04-01 ENCOUNTER — Ambulatory Visit (INDEPENDENT_AMBULATORY_CARE_PROVIDER_SITE_OTHER): Payer: Medicare PPO | Admitting: Psychology

## 2020-04-01 DIAGNOSIS — F331 Major depressive disorder, recurrent, moderate: Secondary | ICD-10-CM

## 2020-04-01 DIAGNOSIS — F411 Generalized anxiety disorder: Secondary | ICD-10-CM

## 2020-04-08 ENCOUNTER — Ambulatory Visit (INDEPENDENT_AMBULATORY_CARE_PROVIDER_SITE_OTHER): Payer: Medicare PPO | Admitting: Psychology

## 2020-04-08 DIAGNOSIS — F411 Generalized anxiety disorder: Secondary | ICD-10-CM

## 2020-04-08 DIAGNOSIS — F331 Major depressive disorder, recurrent, moderate: Secondary | ICD-10-CM | POA: Diagnosis not present

## 2020-04-12 ENCOUNTER — Ambulatory Visit: Payer: Medicare PPO | Admitting: Psychology

## 2020-04-13 ENCOUNTER — Ambulatory Visit: Payer: Medicare PPO | Admitting: Psychology

## 2020-04-18 ENCOUNTER — Ambulatory Visit (INDEPENDENT_AMBULATORY_CARE_PROVIDER_SITE_OTHER): Payer: Medicare PPO | Admitting: Psychology

## 2020-04-18 DIAGNOSIS — F331 Major depressive disorder, recurrent, moderate: Secondary | ICD-10-CM | POA: Diagnosis not present

## 2020-04-18 DIAGNOSIS — F411 Generalized anxiety disorder: Secondary | ICD-10-CM | POA: Diagnosis not present

## 2020-04-18 DIAGNOSIS — F4323 Adjustment disorder with mixed anxiety and depressed mood: Secondary | ICD-10-CM

## 2020-04-29 ENCOUNTER — Ambulatory Visit (INDEPENDENT_AMBULATORY_CARE_PROVIDER_SITE_OTHER): Payer: Medicare PPO | Admitting: Psychology

## 2020-04-29 DIAGNOSIS — F411 Generalized anxiety disorder: Secondary | ICD-10-CM

## 2020-04-29 DIAGNOSIS — F331 Major depressive disorder, recurrent, moderate: Secondary | ICD-10-CM | POA: Diagnosis not present

## 2020-05-03 ENCOUNTER — Ambulatory Visit (INDEPENDENT_AMBULATORY_CARE_PROVIDER_SITE_OTHER): Payer: Medicare PPO | Admitting: Psychology

## 2020-05-03 DIAGNOSIS — F331 Major depressive disorder, recurrent, moderate: Secondary | ICD-10-CM | POA: Diagnosis not present

## 2020-05-03 DIAGNOSIS — F411 Generalized anxiety disorder: Secondary | ICD-10-CM

## 2020-05-12 ENCOUNTER — Ambulatory Visit (INDEPENDENT_AMBULATORY_CARE_PROVIDER_SITE_OTHER): Payer: Medicare PPO | Admitting: Psychology

## 2020-05-12 DIAGNOSIS — F411 Generalized anxiety disorder: Secondary | ICD-10-CM | POA: Diagnosis not present

## 2020-05-12 DIAGNOSIS — F331 Major depressive disorder, recurrent, moderate: Secondary | ICD-10-CM | POA: Diagnosis not present

## 2020-05-19 ENCOUNTER — Ambulatory Visit: Payer: Medicare PPO | Admitting: Psychology

## 2020-05-27 ENCOUNTER — Ambulatory Visit (INDEPENDENT_AMBULATORY_CARE_PROVIDER_SITE_OTHER): Payer: Medicare PPO | Admitting: Psychology

## 2020-05-27 DIAGNOSIS — F411 Generalized anxiety disorder: Secondary | ICD-10-CM

## 2020-05-27 DIAGNOSIS — F331 Major depressive disorder, recurrent, moderate: Secondary | ICD-10-CM

## 2020-06-03 ENCOUNTER — Ambulatory Visit (INDEPENDENT_AMBULATORY_CARE_PROVIDER_SITE_OTHER): Payer: Medicare PPO | Admitting: Psychology

## 2020-06-03 DIAGNOSIS — F411 Generalized anxiety disorder: Secondary | ICD-10-CM

## 2020-06-03 DIAGNOSIS — F331 Major depressive disorder, recurrent, moderate: Secondary | ICD-10-CM

## 2020-06-30 ENCOUNTER — Ambulatory Visit (INDEPENDENT_AMBULATORY_CARE_PROVIDER_SITE_OTHER): Payer: Medicare PPO | Admitting: Psychology

## 2020-06-30 DIAGNOSIS — F331 Major depressive disorder, recurrent, moderate: Secondary | ICD-10-CM | POA: Diagnosis not present

## 2020-06-30 DIAGNOSIS — F411 Generalized anxiety disorder: Secondary | ICD-10-CM | POA: Diagnosis not present

## 2020-07-04 ENCOUNTER — Ambulatory Visit (INDEPENDENT_AMBULATORY_CARE_PROVIDER_SITE_OTHER): Payer: Medicare PPO | Admitting: Psychology

## 2020-07-04 DIAGNOSIS — F411 Generalized anxiety disorder: Secondary | ICD-10-CM

## 2020-07-04 DIAGNOSIS — F331 Major depressive disorder, recurrent, moderate: Secondary | ICD-10-CM | POA: Diagnosis not present

## 2020-07-06 ENCOUNTER — Ambulatory Visit (INDEPENDENT_AMBULATORY_CARE_PROVIDER_SITE_OTHER): Payer: Medicare PPO | Admitting: Psychology

## 2020-07-06 DIAGNOSIS — F411 Generalized anxiety disorder: Secondary | ICD-10-CM

## 2020-07-06 DIAGNOSIS — F331 Major depressive disorder, recurrent, moderate: Secondary | ICD-10-CM | POA: Diagnosis not present

## 2020-07-12 ENCOUNTER — Ambulatory Visit: Payer: Medicare PPO | Admitting: Psychology

## 2020-07-13 ENCOUNTER — Ambulatory Visit (INDEPENDENT_AMBULATORY_CARE_PROVIDER_SITE_OTHER): Payer: Medicare PPO | Admitting: Psychology

## 2020-07-13 DIAGNOSIS — F411 Generalized anxiety disorder: Secondary | ICD-10-CM | POA: Diagnosis not present

## 2020-07-13 DIAGNOSIS — F331 Major depressive disorder, recurrent, moderate: Secondary | ICD-10-CM

## 2020-07-20 ENCOUNTER — Ambulatory Visit (INDEPENDENT_AMBULATORY_CARE_PROVIDER_SITE_OTHER): Payer: Medicare PPO | Admitting: Psychology

## 2020-07-20 DIAGNOSIS — F331 Major depressive disorder, recurrent, moderate: Secondary | ICD-10-CM | POA: Diagnosis not present

## 2020-07-20 DIAGNOSIS — F411 Generalized anxiety disorder: Secondary | ICD-10-CM

## 2020-07-26 ENCOUNTER — Ambulatory Visit (INDEPENDENT_AMBULATORY_CARE_PROVIDER_SITE_OTHER): Payer: Medicare PPO | Admitting: Psychology

## 2020-07-26 DIAGNOSIS — F411 Generalized anxiety disorder: Secondary | ICD-10-CM | POA: Diagnosis not present

## 2020-07-26 DIAGNOSIS — F331 Major depressive disorder, recurrent, moderate: Secondary | ICD-10-CM

## 2020-08-02 ENCOUNTER — Ambulatory Visit (INDEPENDENT_AMBULATORY_CARE_PROVIDER_SITE_OTHER): Payer: Medicare PPO | Admitting: Psychology

## 2020-08-02 DIAGNOSIS — F411 Generalized anxiety disorder: Secondary | ICD-10-CM | POA: Diagnosis not present

## 2020-08-02 DIAGNOSIS — F331 Major depressive disorder, recurrent, moderate: Secondary | ICD-10-CM

## 2020-08-09 ENCOUNTER — Ambulatory Visit (INDEPENDENT_AMBULATORY_CARE_PROVIDER_SITE_OTHER): Payer: Medicare PPO | Admitting: Psychology

## 2020-08-09 DIAGNOSIS — F411 Generalized anxiety disorder: Secondary | ICD-10-CM | POA: Diagnosis not present

## 2020-08-09 DIAGNOSIS — F331 Major depressive disorder, recurrent, moderate: Secondary | ICD-10-CM

## 2020-08-17 ENCOUNTER — Ambulatory Visit: Payer: Medicare PPO | Admitting: Psychology

## 2020-08-23 ENCOUNTER — Ambulatory Visit (INDEPENDENT_AMBULATORY_CARE_PROVIDER_SITE_OTHER): Payer: Medicare PPO | Admitting: Psychology

## 2020-08-23 DIAGNOSIS — F411 Generalized anxiety disorder: Secondary | ICD-10-CM | POA: Diagnosis not present

## 2020-08-23 DIAGNOSIS — F331 Major depressive disorder, recurrent, moderate: Secondary | ICD-10-CM

## 2020-09-21 ENCOUNTER — Ambulatory Visit (INDEPENDENT_AMBULATORY_CARE_PROVIDER_SITE_OTHER): Payer: Medicare PPO | Admitting: Psychology

## 2020-09-21 DIAGNOSIS — F331 Major depressive disorder, recurrent, moderate: Secondary | ICD-10-CM | POA: Diagnosis not present

## 2020-09-21 DIAGNOSIS — F411 Generalized anxiety disorder: Secondary | ICD-10-CM

## 2020-09-22 ENCOUNTER — Ambulatory Visit: Payer: Medicare PPO | Admitting: Psychology

## 2020-09-28 ENCOUNTER — Ambulatory Visit (INDEPENDENT_AMBULATORY_CARE_PROVIDER_SITE_OTHER): Payer: Medicare PPO | Admitting: Psychology

## 2020-09-28 DIAGNOSIS — F331 Major depressive disorder, recurrent, moderate: Secondary | ICD-10-CM

## 2020-09-28 DIAGNOSIS — F411 Generalized anxiety disorder: Secondary | ICD-10-CM | POA: Diagnosis not present

## 2020-09-30 ENCOUNTER — Ambulatory Visit (INDEPENDENT_AMBULATORY_CARE_PROVIDER_SITE_OTHER): Payer: Medicare PPO | Admitting: Psychology

## 2020-09-30 DIAGNOSIS — F331 Major depressive disorder, recurrent, moderate: Secondary | ICD-10-CM

## 2020-09-30 DIAGNOSIS — F411 Generalized anxiety disorder: Secondary | ICD-10-CM

## 2020-10-05 ENCOUNTER — Ambulatory Visit: Payer: Medicare PPO | Admitting: Psychology

## 2020-10-10 ENCOUNTER — Ambulatory Visit: Payer: Medicare PPO | Admitting: Internal Medicine

## 2020-10-14 ENCOUNTER — Ambulatory Visit (INDEPENDENT_AMBULATORY_CARE_PROVIDER_SITE_OTHER): Payer: Medicare PPO | Admitting: Psychology

## 2020-10-14 ENCOUNTER — Ambulatory Visit: Payer: Medicare PPO

## 2020-10-14 DIAGNOSIS — F411 Generalized anxiety disorder: Secondary | ICD-10-CM | POA: Diagnosis not present

## 2020-10-14 DIAGNOSIS — F331 Major depressive disorder, recurrent, moderate: Secondary | ICD-10-CM

## 2020-10-18 ENCOUNTER — Ambulatory Visit (INDEPENDENT_AMBULATORY_CARE_PROVIDER_SITE_OTHER): Payer: Medicare PPO | Admitting: Psychology

## 2020-10-18 DIAGNOSIS — F411 Generalized anxiety disorder: Secondary | ICD-10-CM | POA: Diagnosis not present

## 2020-10-18 DIAGNOSIS — F331 Major depressive disorder, recurrent, moderate: Secondary | ICD-10-CM

## 2020-10-19 ENCOUNTER — Ambulatory Visit (INDEPENDENT_AMBULATORY_CARE_PROVIDER_SITE_OTHER): Payer: Medicare PPO | Admitting: Psychology

## 2020-10-19 DIAGNOSIS — F411 Generalized anxiety disorder: Secondary | ICD-10-CM

## 2020-10-19 DIAGNOSIS — F331 Major depressive disorder, recurrent, moderate: Secondary | ICD-10-CM

## 2020-10-31 ENCOUNTER — Ambulatory Visit (INDEPENDENT_AMBULATORY_CARE_PROVIDER_SITE_OTHER): Payer: Medicare PPO | Admitting: Psychology

## 2020-10-31 DIAGNOSIS — F411 Generalized anxiety disorder: Secondary | ICD-10-CM | POA: Diagnosis not present

## 2020-10-31 DIAGNOSIS — F331 Major depressive disorder, recurrent, moderate: Secondary | ICD-10-CM

## 2020-11-09 ENCOUNTER — Ambulatory Visit (INDEPENDENT_AMBULATORY_CARE_PROVIDER_SITE_OTHER): Payer: Medicare PPO | Admitting: Psychology

## 2020-11-09 DIAGNOSIS — F331 Major depressive disorder, recurrent, moderate: Secondary | ICD-10-CM

## 2020-11-09 DIAGNOSIS — F411 Generalized anxiety disorder: Secondary | ICD-10-CM

## 2020-11-10 ENCOUNTER — Ambulatory Visit (INDEPENDENT_AMBULATORY_CARE_PROVIDER_SITE_OTHER): Payer: Medicare PPO | Admitting: Internal Medicine

## 2020-11-10 ENCOUNTER — Other Ambulatory Visit: Payer: Self-pay

## 2020-11-10 ENCOUNTER — Encounter: Payer: Self-pay | Admitting: Internal Medicine

## 2020-11-10 VITALS — BP 128/88 | HR 97 | Ht 60.0 in | Wt 157.8 lb

## 2020-11-10 DIAGNOSIS — Z794 Long term (current) use of insulin: Secondary | ICD-10-CM | POA: Diagnosis not present

## 2020-11-10 DIAGNOSIS — E063 Autoimmune thyroiditis: Secondary | ICD-10-CM

## 2020-11-10 DIAGNOSIS — E559 Vitamin D deficiency, unspecified: Secondary | ICD-10-CM | POA: Diagnosis not present

## 2020-11-10 DIAGNOSIS — Z8639 Personal history of other endocrine, nutritional and metabolic disease: Secondary | ICD-10-CM | POA: Diagnosis not present

## 2020-11-10 DIAGNOSIS — E1165 Type 2 diabetes mellitus with hyperglycemia: Secondary | ICD-10-CM | POA: Diagnosis not present

## 2020-11-10 LAB — POCT GLYCOSYLATED HEMOGLOBIN (HGB A1C): Hemoglobin A1C: 9.8 % — AB (ref 4.0–5.6)

## 2020-11-10 LAB — T4, FREE: Free T4: 0.87 ng/dL (ref 0.60–1.60)

## 2020-11-10 LAB — VITAMIN D 25 HYDROXY (VIT D DEFICIENCY, FRACTURES): VITD: 23.53 ng/mL — ABNORMAL LOW (ref 30.00–100.00)

## 2020-11-10 LAB — TSH: TSH: 1.06 u[IU]/mL (ref 0.35–4.50)

## 2020-11-10 MED ORDER — INSULIN NPH (HUMAN) (ISOPHANE) 100 UNIT/ML ~~LOC~~ SUSP: SUBCUTANEOUS | 3 refills | Status: DC

## 2020-11-10 MED ORDER — BD PEN NEEDLE NANO 2ND GEN 32G X 4 MM MISC: 2 refills | Status: AC

## 2020-11-10 MED ORDER — HUMALOG KWIKPEN 200 UNIT/ML ~~LOC~~ SOPN: PEN_INJECTOR | SUBCUTANEOUS | 3 refills | Status: DC

## 2020-11-10 MED ORDER — CANAGLIFLOZIN 300 MG PO TABS: 300.0000 mg | ORAL_TABLET | Freq: Every day | ORAL | 3 refills | Status: DC
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
## 2020-11-10 NOTE — Patient Instructions (Addendum)
Please continue: - Metformin 1000 mg at bedtime  Please increase: - Humulin N 30 units in am and 25 units at bedtime - Humalog 25-30 units 3x a day 15 min before meals  Please start: - Invokana 150 mg in am  Please stop at the lab.  Please return in 1.5 months with your blood sugar log.

## 2020-11-12 ENCOUNTER — Other Ambulatory Visit: Payer: Self-pay | Admitting: Internal Medicine

## 2020-11-18 ENCOUNTER — Ambulatory Visit: Payer: Medicare PPO | Admitting: Psychology

## 2020-11-23 ENCOUNTER — Ambulatory Visit (INDEPENDENT_AMBULATORY_CARE_PROVIDER_SITE_OTHER): Payer: Medicare PPO | Admitting: Psychology

## 2020-11-23 DIAGNOSIS — F331 Major depressive disorder, recurrent, moderate: Secondary | ICD-10-CM

## 2020-11-23 DIAGNOSIS — F411 Generalized anxiety disorder: Secondary | ICD-10-CM

## 2020-11-30 ENCOUNTER — Ambulatory Visit (INDEPENDENT_AMBULATORY_CARE_PROVIDER_SITE_OTHER): Payer: Medicare PPO | Admitting: Psychology

## 2020-11-30 DIAGNOSIS — F411 Generalized anxiety disorder: Secondary | ICD-10-CM

## 2020-11-30 DIAGNOSIS — F331 Major depressive disorder, recurrent, moderate: Secondary | ICD-10-CM | POA: Diagnosis not present

## 2020-12-15 ENCOUNTER — Ambulatory Visit (INDEPENDENT_AMBULATORY_CARE_PROVIDER_SITE_OTHER): Payer: Medicare PPO | Admitting: Psychology

## 2020-12-15 DIAGNOSIS — F331 Major depressive disorder, recurrent, moderate: Secondary | ICD-10-CM

## 2020-12-15 DIAGNOSIS — F411 Generalized anxiety disorder: Secondary | ICD-10-CM | POA: Diagnosis not present

## 2020-12-21 ENCOUNTER — Ambulatory Visit (INDEPENDENT_AMBULATORY_CARE_PROVIDER_SITE_OTHER): Payer: Medicare PPO | Admitting: Psychology

## 2020-12-21 DIAGNOSIS — F331 Major depressive disorder, recurrent, moderate: Secondary | ICD-10-CM | POA: Diagnosis not present

## 2020-12-21 DIAGNOSIS — F411 Generalized anxiety disorder: Secondary | ICD-10-CM

## 2021-01-06 ENCOUNTER — Ambulatory Visit (INDEPENDENT_AMBULATORY_CARE_PROVIDER_SITE_OTHER): Payer: Medicare PPO | Admitting: Psychology

## 2021-01-06 DIAGNOSIS — F331 Major depressive disorder, recurrent, moderate: Secondary | ICD-10-CM | POA: Diagnosis not present

## 2021-01-06 DIAGNOSIS — F411 Generalized anxiety disorder: Secondary | ICD-10-CM

## 2021-01-16 ENCOUNTER — Ambulatory Visit: Payer: Medicare PPO | Admitting: Psychology

## 2021-01-24 ENCOUNTER — Ambulatory Visit (INDEPENDENT_AMBULATORY_CARE_PROVIDER_SITE_OTHER): Payer: Medicare PPO | Admitting: Psychology

## 2021-01-24 DIAGNOSIS — F411 Generalized anxiety disorder: Secondary | ICD-10-CM | POA: Diagnosis not present

## 2021-01-24 DIAGNOSIS — F331 Major depressive disorder, recurrent, moderate: Secondary | ICD-10-CM

## 2021-01-31 ENCOUNTER — Ambulatory Visit (INDEPENDENT_AMBULATORY_CARE_PROVIDER_SITE_OTHER): Payer: Medicare PPO | Admitting: Psychology

## 2021-01-31 DIAGNOSIS — F331 Major depressive disorder, recurrent, moderate: Secondary | ICD-10-CM | POA: Diagnosis not present

## 2021-01-31 DIAGNOSIS — F411 Generalized anxiety disorder: Secondary | ICD-10-CM | POA: Diagnosis not present

## 2021-02-08 ENCOUNTER — Other Ambulatory Visit: Payer: Self-pay

## 2021-02-08 ENCOUNTER — Ambulatory Visit (INDEPENDENT_AMBULATORY_CARE_PROVIDER_SITE_OTHER): Payer: Medicare PPO | Admitting: Psychology

## 2021-02-08 DIAGNOSIS — F411 Generalized anxiety disorder: Secondary | ICD-10-CM

## 2021-02-08 DIAGNOSIS — F331 Major depressive disorder, recurrent, moderate: Secondary | ICD-10-CM

## 2021-02-15 ENCOUNTER — Ambulatory Visit (INDEPENDENT_AMBULATORY_CARE_PROVIDER_SITE_OTHER): Payer: Medicare PPO | Admitting: Psychology

## 2021-02-15 DIAGNOSIS — F331 Major depressive disorder, recurrent, moderate: Secondary | ICD-10-CM

## 2021-02-15 DIAGNOSIS — F411 Generalized anxiety disorder: Secondary | ICD-10-CM | POA: Diagnosis not present

## 2021-02-28 ENCOUNTER — Ambulatory Visit (INDEPENDENT_AMBULATORY_CARE_PROVIDER_SITE_OTHER): Payer: Medicare PPO | Admitting: Psychology

## 2021-02-28 DIAGNOSIS — F331 Major depressive disorder, recurrent, moderate: Secondary | ICD-10-CM

## 2021-02-28 DIAGNOSIS — F411 Generalized anxiety disorder: Secondary | ICD-10-CM | POA: Diagnosis not present

## 2021-03-01 ENCOUNTER — Other Ambulatory Visit: Payer: Self-pay | Admitting: Family Medicine

## 2021-03-01 DIAGNOSIS — Z1231 Encounter for screening mammogram for malignant neoplasm of breast: Secondary | ICD-10-CM

## 2021-03-07 ENCOUNTER — Ambulatory Visit (INDEPENDENT_AMBULATORY_CARE_PROVIDER_SITE_OTHER): Payer: Medicare PPO | Admitting: Psychology

## 2021-03-07 DIAGNOSIS — F331 Major depressive disorder, recurrent, moderate: Secondary | ICD-10-CM

## 2021-03-07 DIAGNOSIS — F411 Generalized anxiety disorder: Secondary | ICD-10-CM

## 2021-03-15 ENCOUNTER — Ambulatory Visit (INDEPENDENT_AMBULATORY_CARE_PROVIDER_SITE_OTHER): Payer: Medicare PPO | Admitting: Psychology

## 2021-03-15 DIAGNOSIS — F331 Major depressive disorder, recurrent, moderate: Secondary | ICD-10-CM | POA: Diagnosis not present

## 2021-03-15 DIAGNOSIS — F411 Generalized anxiety disorder: Secondary | ICD-10-CM | POA: Diagnosis not present

## 2021-03-22 ENCOUNTER — Ambulatory Visit (INDEPENDENT_AMBULATORY_CARE_PROVIDER_SITE_OTHER): Payer: Medicare PPO | Admitting: Psychology

## 2021-03-22 DIAGNOSIS — F331 Major depressive disorder, recurrent, moderate: Secondary | ICD-10-CM

## 2021-03-22 DIAGNOSIS — F411 Generalized anxiety disorder: Secondary | ICD-10-CM

## 2021-03-29 ENCOUNTER — Ambulatory Visit (INDEPENDENT_AMBULATORY_CARE_PROVIDER_SITE_OTHER): Payer: Medicare PPO | Admitting: Psychology

## 2021-03-29 DIAGNOSIS — F411 Generalized anxiety disorder: Secondary | ICD-10-CM

## 2021-03-29 DIAGNOSIS — F331 Major depressive disorder, recurrent, moderate: Secondary | ICD-10-CM

## 2021-04-04 ENCOUNTER — Ambulatory Visit (INDEPENDENT_AMBULATORY_CARE_PROVIDER_SITE_OTHER): Payer: Medicare PPO | Admitting: Psychology

## 2021-04-04 DIAGNOSIS — F331 Major depressive disorder, recurrent, moderate: Secondary | ICD-10-CM

## 2021-04-04 DIAGNOSIS — F411 Generalized anxiety disorder: Secondary | ICD-10-CM | POA: Diagnosis not present

## 2021-04-11 ENCOUNTER — Ambulatory Visit: Payer: Medicare PPO | Admitting: Psychology

## 2021-04-14 ENCOUNTER — Ambulatory Visit (INDEPENDENT_AMBULATORY_CARE_PROVIDER_SITE_OTHER): Payer: Medicare PPO | Admitting: Psychology

## 2021-04-14 DIAGNOSIS — F331 Major depressive disorder, recurrent, moderate: Secondary | ICD-10-CM

## 2021-04-14 DIAGNOSIS — F411 Generalized anxiety disorder: Secondary | ICD-10-CM

## 2021-04-24 ENCOUNTER — Ambulatory Visit: Payer: Medicare PPO | Admitting: Psychology

## 2021-04-25 ENCOUNTER — Inpatient Hospital Stay: Admission: RE | Admit: 2021-04-25 | Payer: Medicare PPO | Source: Ambulatory Visit

## 2021-05-02 ENCOUNTER — Ambulatory Visit (INDEPENDENT_AMBULATORY_CARE_PROVIDER_SITE_OTHER): Payer: Medicare PPO | Admitting: Psychology

## 2021-05-02 DIAGNOSIS — F331 Major depressive disorder, recurrent, moderate: Secondary | ICD-10-CM | POA: Diagnosis not present

## 2021-05-02 DIAGNOSIS — F411 Generalized anxiety disorder: Secondary | ICD-10-CM | POA: Diagnosis not present

## 2021-05-08 ENCOUNTER — Encounter: Payer: Medicare PPO | Attending: Family Medicine | Admitting: Nutrition

## 2021-05-08 ENCOUNTER — Other Ambulatory Visit: Payer: Self-pay

## 2021-05-08 DIAGNOSIS — Z794 Long term (current) use of insulin: Secondary | ICD-10-CM | POA: Insufficient documentation

## 2021-05-08 DIAGNOSIS — E1165 Type 2 diabetes mellitus with hyperglycemia: Secondary | ICD-10-CM

## 2021-05-12 NOTE — Patient Instructions (Signed)
Scan arm before each meal and at bedtime. Consider switching to a diet soda like Diet Dr. Malachi Bonds or  Diet Ethelle Lyon  Consider 2 pieces of toast with peanut butter in place of cold cereal and milk at 9PM Return in 2 weeks for review of CGM and another sensor.

## 2021-05-12 NOTE — Progress Notes (Signed)
Patient is here to discuss her diet and blood sugar readings.  SBGM: She did not bring her meter.  Says is testing 1-2X/day at different times and " blood sugars vary from 150-350.   Insulin dose:  AM: 30N/20 Humalog U-200, AC S: 20 U Humalog. says PM dose is sometimes forgotten (1-3X/wk) Exercise: 1 hour cardio 5PM  Mondays and Wednesdays.  Says blood sugar will usually drop 100 points from 300s to 200s.   Typical day: 6-7AM: up.  Takes 30 NPH, and 20u of Humalog.  Does not vary the dose of Humalog based on blood sugar readig or meal size. **eats nothing in the AM:  Denies low blood sugar 12PM-1PM:  grilled Chicken (5 oz, broccoli, sweet potato,  Regular Coke to drink, or water, or fried chicken with fries, or chicken wrap with fries (3-4X/wk), other days: salmon salad, with ranch dressing [place] at [employer], with fried rick, or shrimp and regualr Coke Diet history very vague with amounts and times eaten. Says very little fast food, then history says at least QOD.   8-9 PM: Cold Cereal and whole milk. (Usually [place] Discussions/ Suggestions given:  Need for consistent blood sugar readings to see what effects meals are having on insulin dose-4X/day ac and HS ".I hate doing this"   2. When I suggested she stop drinking regular soda she began to cry, saying she can not do this, and she has seen other dietitians that have told her the same thing, but she can not do this.  3.  When I suggested other meals than cold cereal and milk, she also began to cry saying she is not able to do this. 4.  Discussed how the insulin works to cover which meals she is eating and the fact that her meals vary with the amounts of carbs eaten, which means she should be varying the amounts of Humalog with each meal. The meals range from 45 g to 120 grams!.   5.  Discussed the importance of getting blood sugars under control to prevent further complications like gastroparesis and pain/oss of feeing in the feet and  legs/l  Plan:   I put a Libre CGM on her arm to give her some sense of how high her blood sugars are going after the cereal,and regular sodas.  She agreed to come back in 2 weeks for review of these readings and maybe this can convince her of the need for limiting carbs/or adjusting insulin doses for when she is drinking regular sodas verses water with meals. She agreed to return in 2 weeks for review of this, and told her I would give her another sensor to help her with not having to test her blood sugar readings.

## 2021-05-14 ENCOUNTER — Other Ambulatory Visit: Payer: Self-pay | Admitting: Internal Medicine

## 2021-05-16 ENCOUNTER — Ambulatory Visit (INDEPENDENT_AMBULATORY_CARE_PROVIDER_SITE_OTHER): Payer: Medicare PPO | Admitting: Psychology

## 2021-05-16 DIAGNOSIS — F331 Major depressive disorder, recurrent, moderate: Secondary | ICD-10-CM | POA: Diagnosis not present

## 2021-05-16 DIAGNOSIS — F411 Generalized anxiety disorder: Secondary | ICD-10-CM | POA: Diagnosis not present

## 2021-05-22 ENCOUNTER — Encounter: Payer: Medicare PPO | Admitting: Nutrition

## 2021-05-22 ENCOUNTER — Other Ambulatory Visit: Payer: Self-pay

## 2021-05-22 DIAGNOSIS — E1165 Type 2 diabetes mellitus with hyperglycemia: Secondary | ICD-10-CM

## 2021-05-22 DIAGNOSIS — Z794 Long term (current) use of insulin: Secondary | ICD-10-CM

## 2021-05-23 ENCOUNTER — Ambulatory Visit: Payer: Medicare PPO | Admitting: Psychology

## 2021-05-23 NOTE — Progress Notes (Signed)
Patient reports that libre fell off after 2 1/2 days. She was able to see how high her blood sugar readings went after drinking sweet tea and sodas, and the effects of lower blood sugars after exercise.  Most readings were over 200, except for a 15 hour period after 1 hour exercise session when blood sugars were in the upper 100s.  Readings showing that blood sugar is going over 300 after drinking these drinks, as well as eating high fat meals like fried chicken biscuits with fries for breakfast.   Discussed importance of not just stopping sweetened drinks but to limit fats in meals, like reducing biscuits to 2X week, and stop fries with them, and switching to fruit Weight is up 2 pounds and we discussed that if she just switch to non caloric drinks and grilled and lower fat meat/bread choices that weight will come down.  She agreed to try this and wished another sensor and return again in 2 weeks.  Another sensor was put on and she was shown how to keep a food record for better interpretation of blood sugar rises from meals eaten.

## 2021-05-23 NOTE — Patient Instructions (Signed)
Stop drinking sweet sodas, and sweet tea Stop fries when eating biscuits and have grilled chicken on this sandwich. Limit fries to 2X/week  and have them with grilled chicken, or hamburger with no cheese, and diet drink. Use Crystal Light flavor packets in bottled water, in place of sweet tea

## 2021-05-30 ENCOUNTER — Ambulatory Visit
Admission: RE | Admit: 2021-05-30 | Discharge: 2021-05-30 | Disposition: A | Discharge status: Home / Self Care | Payer: Medicare PPO | Source: Ambulatory Visit | Attending: Family Medicine | Admitting: Family Medicine

## 2021-05-30 ENCOUNTER — Other Ambulatory Visit: Payer: Self-pay

## 2021-05-30 DIAGNOSIS — Z1231 Encounter for screening mammogram for malignant neoplasm of breast: Secondary | ICD-10-CM

## 2021-06-01 ENCOUNTER — Encounter: Payer: Self-pay | Admitting: Internal Medicine

## 2021-06-01 DIAGNOSIS — Z794 Long term (current) use of insulin: Secondary | ICD-10-CM

## 2021-06-01 DIAGNOSIS — E1165 Type 2 diabetes mellitus with hyperglycemia: Secondary | ICD-10-CM

## 2021-06-01 MED ORDER — FREESTYLE LIBRE 2 READER DEVI: 0 refills | Status: DC

## 2021-06-01 MED ORDER — FREESTYLE LIBRE 2 SENSOR MISC: 3 refills | Status: DC

## 2021-06-02 ENCOUNTER — Telehealth: Payer: Self-pay

## 2021-06-02 NOTE — Telephone Encounter (Signed)
Called and spoke with [facility] regarding pt freestyle [place]  PA is needed can you look into this for me and please send this information back to Dr Darnell Level  assistance. I might here next weeks thanks.

## 2021-06-05 ENCOUNTER — Other Ambulatory Visit (HOSPITAL_COMMUNITY): Payer: Self-pay

## 2021-06-05 ENCOUNTER — Telehealth: Payer: Self-pay | Admitting: Nutrition

## 2021-06-07 ENCOUNTER — Ambulatory Visit (INDEPENDENT_AMBULATORY_CARE_PROVIDER_SITE_OTHER): Payer: Medicare PPO | Admitting: Psychology

## 2021-06-07 DIAGNOSIS — F411 Generalized anxiety disorder: Secondary | ICD-10-CM | POA: Diagnosis not present

## 2021-06-07 DIAGNOSIS — F331 Major depressive disorder, recurrent, moderate: Secondary | ICD-10-CM | POA: Diagnosis not present

## 2021-06-07 NOTE — Telephone Encounter (Signed)
Called and unable to contact pt via phone. Mailbox full. Pt contacted via Mychart and advised of test claim.

## 2021-06-12 ENCOUNTER — Ambulatory Visit: Payer: Medicare PPO | Admitting: Nutrition

## 2021-06-12 ENCOUNTER — Ambulatory Visit (INDEPENDENT_AMBULATORY_CARE_PROVIDER_SITE_OTHER): Payer: Medicare PPO | Admitting: Psychology

## 2021-06-12 DIAGNOSIS — F411 Generalized anxiety disorder: Secondary | ICD-10-CM | POA: Diagnosis not present

## 2021-06-12 DIAGNOSIS — F331 Major depressive disorder, recurrent, moderate: Secondary | ICD-10-CM

## 2021-06-13 ENCOUNTER — Encounter: Payer: Self-pay | Admitting: Internal Medicine

## 2021-06-14 ENCOUNTER — Other Ambulatory Visit: Payer: Self-pay

## 2021-06-14 ENCOUNTER — Encounter: Payer: Medicare PPO | Attending: Family Medicine | Admitting: Nutrition

## 2021-06-14 DIAGNOSIS — E1165 Type 2 diabetes mellitus with hyperglycemia: Secondary | ICD-10-CM | POA: Insufficient documentation

## 2021-06-14 DIAGNOSIS — Z794 Long term (current) use of insulin: Secondary | ICD-10-CM | POA: Diagnosis present

## 2021-06-14 NOTE — Patient Instructions (Signed)
Move  PM dose of NPH to 9PM.  Take no Humalog in AM, just before lunch and supper: 30u

## 2021-06-14 NOTE — Progress Notes (Signed)
Meds:  7:30AM  She takesHumalog 30u/NPH:30.                     11AM, Humalog 30u, and NPH: 30u 8-9PM: Humalog 30 She eats no breakfast but take Humalog in AM, and has had 3 lows, in the 60s, once before lunch and once after lunch. SGBM:  FBSs : 169-299, acL 153  acS: 105-155.  HS: 250  She wore a Libre for 5 days and overview showed blood sugars rising from 3AM until 7AM.   Suggestions:    Move  PM dose of NPH to 9PM.  Discussed with her that this should bring down her high FBSs Take no Humalog in AM, just before lunch and supper. She reverbalized the times and doses of her insulins and written instructions given for this.   *this AM she only took 15 Humlog to prevent lows,and her FBS was 189, and 2hr. Pc insulin: 148.    She will see Dr. Cruzita Lederer on Monday, and me following her visit to reinforce doses Dr. Cruzita Lederer will give her.

## 2021-06-15 ENCOUNTER — Telehealth: Payer: Self-pay

## 2021-06-15 NOTE — Telephone Encounter (Signed)
Inbound fax requesting forms be completed and faxed with recent clinical notes. Forms completed and faxed to [facility] at [phone]

## 2021-06-19 ENCOUNTER — Encounter: Payer: Self-pay | Admitting: Internal Medicine

## 2021-06-19 ENCOUNTER — Ambulatory Visit (INDEPENDENT_AMBULATORY_CARE_PROVIDER_SITE_OTHER): Payer: Medicare PPO | Admitting: Internal Medicine

## 2021-06-19 ENCOUNTER — Ambulatory Visit (INDEPENDENT_AMBULATORY_CARE_PROVIDER_SITE_OTHER): Payer: Medicare PPO | Admitting: Psychology

## 2021-06-19 ENCOUNTER — Other Ambulatory Visit: Payer: Self-pay

## 2021-06-19 VITALS — BP 130/82 | HR 107 | Ht 60.0 in | Wt 158.6 lb

## 2021-06-19 DIAGNOSIS — E559 Vitamin D deficiency, unspecified: Secondary | ICD-10-CM

## 2021-06-19 DIAGNOSIS — Z8639 Personal history of other endocrine, nutritional and metabolic disease: Secondary | ICD-10-CM

## 2021-06-19 DIAGNOSIS — E063 Autoimmune thyroiditis: Secondary | ICD-10-CM

## 2021-06-19 DIAGNOSIS — Z794 Long term (current) use of insulin: Secondary | ICD-10-CM

## 2021-06-19 DIAGNOSIS — E1165 Type 2 diabetes mellitus with hyperglycemia: Secondary | ICD-10-CM

## 2021-06-19 DIAGNOSIS — F331 Major depressive disorder, recurrent, moderate: Secondary | ICD-10-CM

## 2021-06-19 DIAGNOSIS — F411 Generalized anxiety disorder: Secondary | ICD-10-CM | POA: Diagnosis not present

## 2021-06-19 MED ORDER — OZEMPIC (1 MG/DOSE) 4 MG/3ML ~~LOC~~ SOPN: 1.0000 mg | PEN_INJECTOR | SUBCUTANEOUS | 3 refills | Status: DC

## 2021-06-19 MED ORDER — ROSUVASTATIN CALCIUM 10 MG PO TABS: 10.0000 mg | ORAL_TABLET | Freq: Every day | ORAL | 3 refills | Status: DC

## 2021-06-19 MED ORDER — HUMULIN N KWIKPEN 100 UNIT/ML ~~LOC~~ SUPN: PEN_INJECTOR | SUBCUTANEOUS | 3 refills | Status: DC
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
## 2021-06-19 NOTE — Patient Instructions (Addendum)
Please continue: - Metformin 1000 mg 2x a day - Humulin N 30 units in am and 30 units at bedtime  Please decrease: - Humalog 20-26 units 3x a day 15 min before meals  Please increase: - Ozempic 1 mg weekly  Please start: - Crestor 10 mg daily  Please return in 3 months with your blood sugar log.

## 2021-06-20 NOTE — Telephone Encounter (Signed)
LVM and apologized that I was not here to see her after see saw Dr. Cruzita Lederer. I explained that I was not able to make it back from my other office to meet with her in time.  Reviewed what Dr. Cruzita Lederer told her and suggested we meet in 2 weeks to see how she is doing with adjusting her meal time insulin doses for varying meal/carbohydrate portions.

## 2021-06-26 ENCOUNTER — Ambulatory Visit (INDEPENDENT_AMBULATORY_CARE_PROVIDER_SITE_OTHER): Payer: Medicare PPO | Admitting: Psychology

## 2021-06-26 DIAGNOSIS — F411 Generalized anxiety disorder: Secondary | ICD-10-CM

## 2021-06-26 DIAGNOSIS — F331 Major depressive disorder, recurrent, moderate: Secondary | ICD-10-CM

## 2021-07-04 ENCOUNTER — Ambulatory Visit (INDEPENDENT_AMBULATORY_CARE_PROVIDER_SITE_OTHER): Payer: Medicare PPO | Admitting: Psychology

## 2021-07-04 DIAGNOSIS — F331 Major depressive disorder, recurrent, moderate: Secondary | ICD-10-CM

## 2021-07-04 DIAGNOSIS — F411 Generalized anxiety disorder: Secondary | ICD-10-CM

## 2021-07-06 LAB — POCT GLYCOSYLATED HEMOGLOBIN (HGB A1C): Hemoglobin A1C: 8.5 % — AB (ref 4.0–5.6)

## 2021-07-11 ENCOUNTER — Other Ambulatory Visit: Payer: Self-pay | Admitting: Internal Medicine

## 2021-07-17 ENCOUNTER — Ambulatory Visit (INDEPENDENT_AMBULATORY_CARE_PROVIDER_SITE_OTHER): Payer: Medicare PPO | Admitting: Psychology

## 2021-07-17 DIAGNOSIS — F411 Generalized anxiety disorder: Secondary | ICD-10-CM

## 2021-07-24 ENCOUNTER — Ambulatory Visit: Payer: Medicare PPO | Admitting: Psychology

## 2021-08-01 ENCOUNTER — Ambulatory Visit (INDEPENDENT_AMBULATORY_CARE_PROVIDER_SITE_OTHER): Payer: Medicare PPO | Admitting: Psychology

## 2021-08-01 DIAGNOSIS — F411 Generalized anxiety disorder: Secondary | ICD-10-CM

## 2021-08-01 DIAGNOSIS — F331 Major depressive disorder, recurrent, moderate: Secondary | ICD-10-CM | POA: Diagnosis not present

## 2021-08-08 ENCOUNTER — Ambulatory Visit: Payer: Medicare PPO | Admitting: Psychology

## 2021-08-24 ENCOUNTER — Ambulatory Visit (INDEPENDENT_AMBULATORY_CARE_PROVIDER_SITE_OTHER): Payer: Medicare PPO | Admitting: Psychology

## 2021-08-24 DIAGNOSIS — F411 Generalized anxiety disorder: Secondary | ICD-10-CM

## 2021-08-24 DIAGNOSIS — F331 Major depressive disorder, recurrent, moderate: Secondary | ICD-10-CM | POA: Diagnosis not present

## 2021-09-06 ENCOUNTER — Ambulatory Visit (INDEPENDENT_AMBULATORY_CARE_PROVIDER_SITE_OTHER): Payer: Medicare PPO | Admitting: Psychology

## 2021-09-06 DIAGNOSIS — F411 Generalized anxiety disorder: Secondary | ICD-10-CM | POA: Diagnosis not present

## 2021-09-06 DIAGNOSIS — F331 Major depressive disorder, recurrent, moderate: Secondary | ICD-10-CM
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
## 2021-09-19 ENCOUNTER — Emergency Department (HOSPITAL_BASED_OUTPATIENT_CLINIC_OR_DEPARTMENT_OTHER): Payer: Medicare PPO

## 2021-09-19 ENCOUNTER — Emergency Department (HOSPITAL_BASED_OUTPATIENT_CLINIC_OR_DEPARTMENT_OTHER)
Admission: EM | Admit: 2021-09-19 | Discharge: 2021-09-19 | Disposition: A | Discharge status: Home / Self Care | Payer: Medicare PPO | Attending: Emergency Medicine | Admitting: Emergency Medicine

## 2021-09-19 ENCOUNTER — Other Ambulatory Visit: Payer: Self-pay

## 2021-09-19 ENCOUNTER — Encounter (HOSPITAL_BASED_OUTPATIENT_CLINIC_OR_DEPARTMENT_OTHER): Payer: Self-pay

## 2021-09-19 DIAGNOSIS — E039 Hypothyroidism, unspecified: Secondary | ICD-10-CM | POA: Diagnosis not present

## 2021-09-19 DIAGNOSIS — E119 Type 2 diabetes mellitus without complications: Secondary | ICD-10-CM | POA: Insufficient documentation

## 2021-09-19 DIAGNOSIS — Z7984 Long term (current) use of oral hypoglycemic drugs: Secondary | ICD-10-CM | POA: Diagnosis not present

## 2021-09-19 DIAGNOSIS — H9209 Otalgia, unspecified ear: Secondary | ICD-10-CM | POA: Diagnosis not present

## 2021-09-19 DIAGNOSIS — J029 Acute pharyngitis, unspecified: Secondary | ICD-10-CM | POA: Diagnosis present

## 2021-09-19 DIAGNOSIS — Z794 Long term (current) use of insulin: Secondary | ICD-10-CM | POA: Insufficient documentation

## 2021-09-19 DIAGNOSIS — J45909 Unspecified asthma, uncomplicated: Secondary | ICD-10-CM | POA: Diagnosis not present

## 2021-09-19 DIAGNOSIS — Z7951 Long term (current) use of inhaled steroids: Secondary | ICD-10-CM | POA: Diagnosis not present

## 2021-09-19 DIAGNOSIS — Z20822 Contact with and (suspected) exposure to covid-19: Secondary | ICD-10-CM | POA: Insufficient documentation

## 2021-09-19 DIAGNOSIS — R000 Tachycardia, unspecified: Secondary | ICD-10-CM | POA: Diagnosis not present

## 2021-09-19 DIAGNOSIS — I10 Essential (primary) hypertension: Secondary | ICD-10-CM | POA: Insufficient documentation

## 2021-09-19 DIAGNOSIS — R0981 Nasal congestion: Secondary | ICD-10-CM | POA: Insufficient documentation

## 2021-09-19 LAB — URINALYSIS, ROUTINE W REFLEX MICROSCOPIC
Bilirubin Urine: NEGATIVE
Glucose, UA: NEGATIVE mg/dL
Hgb urine dipstick: NEGATIVE
Ketones, ur: NEGATIVE mg/dL
Leukocytes,Ua: NEGATIVE
Nitrite: NEGATIVE
Protein, ur: NEGATIVE mg/dL
Specific Gravity, Urine: >=1.03 (ref 1.005–1.030)
pH: 5.5 (ref 5.0–8.0)

## 2021-09-19 LAB — CBC WITH DIFFERENTIAL/PLATELET
Abs Immature Granulocytes: 0.05 10*3/uL (ref 0.00–0.07)
Basophils Absolute: 0.1 10*3/uL (ref 0.0–0.1)
Basophils Relative: 1 %
Eosinophils Absolute: 0.1 10*3/uL (ref 0.0–0.5)
Eosinophils Relative: 1 %
HCT: 38 % (ref 36.0–46.0)
Hemoglobin: 12.9 g/dL (ref 12.0–15.0)
Immature Granulocytes: 1 %
Lymphocytes Relative: 19 %
Lymphs Abs: 1.9 10*3/uL (ref 0.7–4.0)
MCH: 30.4 pg (ref 26.0–34.0)
MCHC: 33.9 g/dL (ref 30.0–36.0)
MCV: 89.4 fL (ref 80.0–100.0)
Monocytes Absolute: 0.7 10*3/uL (ref 0.1–1.0)
Monocytes Relative: 7 %
Neutro Abs: 7.6 10*3/uL (ref 1.7–7.7)
Neutrophils Relative %: 71 %
Platelets: 200 10*3/uL (ref 150–400)
RBC: 4.25 MIL/uL (ref 3.87–5.11)
RDW: 12 % (ref 11.5–15.5)
WBC: 10.4 10*3/uL (ref 4.0–10.5)
nRBC: 0 % (ref 0.0–0.2)

## 2021-09-19 LAB — BASIC METABOLIC PANEL
Anion gap: 8 (ref 5–15)
BUN: 7 mg/dL (ref 6–20)
CO2: 25 mmol/L (ref 22–32)
Calcium: 9 mg/dL (ref 8.9–10.3)
Chloride: 105 mmol/L (ref 98–111)
Creatinine, Ser: 0.71 mg/dL (ref 0.44–1.00)
GFR, Estimated: >60 mL/min (ref >60)
Glucose, Bld: 200 mg/dL — ABNORMAL HIGH (ref 70–99)
Potassium: 3.7 mmol/L (ref 3.5–5.1)
Sodium: 138 mmol/L (ref 135–145)

## 2021-09-19 LAB — RESP PANEL BY RT-PCR (FLU A&B, COVID) ARPGX2
Influenza A by PCR: NEGATIVE
Influenza B by PCR: NEGATIVE
SARS Coronavirus 2 by RT PCR: NEGATIVE

## 2021-09-19 LAB — TROPONIN I (HIGH SENSITIVITY): Troponin I (High Sensitivity): 2 ng/L (ref <18)

## 2021-09-19 LAB — GROUP A STREP BY PCR: Group A Strep by PCR: NOT DETECTED

## 2021-09-19 LAB — D-DIMER, QUANTITATIVE: D-Dimer, Quant: 1.28 ug/mL-FEU — ABNORMAL HIGH (ref 0.00–0.50)

## 2021-09-19 MED ORDER — IOHEXOL 350 MG/ML SOLN
100.0000 mL | Freq: Once | INTRAVENOUS | Status: AC | PRN
  Administered 2021-09-19: 10:00:00 100 mL via INTRAVENOUS

## 2021-09-19 MED ORDER — AZITHROMYCIN 250 MG PO TABS: 250.0000 mg | ORAL_TABLET | Freq: Every day | ORAL | 0 refills | Status: DC

## 2021-09-19 MED ORDER — LIDOCAINE VISCOUS HCL 2 % MT SOLN
15.0000 mL | Freq: Once | OROMUCOSAL | Status: AC
  Administered 2021-09-19: 09:00:00 15 mL via OROMUCOSAL
  Filled 2021-09-19: qty 15

## 2021-09-19 MED ORDER — SODIUM CHLORIDE 0.9 % IV BOLUS
1000.0000 mL | Freq: Once | INTRAVENOUS | Status: AC
  Administered 2021-09-19: 09:00:00 1000 mL via INTRAVENOUS

## 2021-09-19 NOTE — ED Provider Notes (Signed)
[facility] Provider Note   CSN: [id number] Arrival date & time: 09/19/21  0732     History Chief Complaint  Patient presents with   Sore Throat    Lisa Cole is a 40 y.o. female.  Presenting to ER with concern for sore throat, ear pain, congestion.  Symptoms ongoing for the past couple days.  Seem to be worsening since yesterday.  States that she noticed increased cough and has had production.  Production is mostly nonbloody but there have been a couple episodes where she thinks she noticed some streaks of blood in her phlegm from coughing.  Denies chest pain , does feel somewhat short of breath at times.  Denies any pain with urination but did say she noticed odor in her urine.  HPI     Past Medical History:  Diagnosis Date   Anemia    Anxiety disorder    Arthritis    Asthma    Atypical chest pain 04/10/2018   BV (bacterial vaginosis)    Chronic pelvic pain in female    Diabetes mellitus    Endometriosis    Hashimoto thyroiditis    Hepatic steatosis    Hypertension    IBS (irritable bowel syndrome)    IC (interstitial cystitis)    Inappropriate sinus tachycardia 06/25/2018   Obesity    OSA (obstructive sleep apnea)    SVT (supraventricular tachycardia) ([place])    Type 2 diabetes mellitus ([place])     Patient Active Problem List   Diagnosis Date Noted   Vitamin D deficiency 12/26/2018   Disorder of brain, unspecified 12/23/2018   Chronic post-traumatic headache, intractable 12/02/2018   Acute posttraumatic headache 11/25/2018   Concussion with loss of consciousness 11/25/2018   Cubital tunnel syndrome 11/13/2018   Pain in right hand 08/05/2018   Arthralgia of right elbow 07/14/2018   Inappropriate sinus tachycardia 06/25/2018   Ulnar neuropathy of right upper extremity 04/11/2018   Atypical chest pain 04/10/2018   Carpal tunnel syndrome, right 03/19/2018   Right wrist tendinitis 03/19/2018   OSA (obstructive sleep apnea)  01/25/2016   Asthma 01/25/2016   GERD (gastroesophageal reflux disease) 01/25/2016   H/O thyroid nodule 11/30/2015   Type 2 diabetes mellitus with hyperglycemia, with long-term current use of insulin ([place]) 11/30/2015   Primary osteoarthritis of right knee 08/05/2015   Increased frequency of urination 02/04/2013   Mixed urge and stress incontinence 02/04/2013   Fibromyalgia 02/04/2013   Knee pain 10/28/2012   Arthritis 03/28/2012   Essential hypertension 03/28/2012   Gout, unspecified 03/28/2012   Heart disease, unspecified 03/28/2012   History of asthma 03/28/2012   Interstitial cystitis (chronic) without hematuria 03/28/2012   Abnormal blood chemistry 03/09/2012   Chronic pain 03/09/2012   Sinus tachycardia 03/09/2012   Dysphonia 01/28/2012   Reflux 01/28/2012   Thyroid nodule 05/17/2011   Cough 03/02/2011   CONSTIPATION, CHRONIC 12/07/2010   IRRITABLE BOWEL SYNDROME 12/07/2010   OTHER DYSPHAGIA 12/07/2010   ABDOMINAL PAIN OTHER SPECIFIED SITE 12/07/2010   HEADACHE 07/17/2010   Headache 07/17/2010   HASHIMOTO'S THYROIDITIS 02/16/2010   ADHD 02/16/2010   Attention deficit disorder with hyperactivity 02/16/2010   Anxiety state 05/17/2009   Hypothyroidism 04/05/2009   FIBROCYSTIC BREAST DISEASE 03/11/2009   OBESITY, UNSPECIFIED 12/27/2008   FATTY LIVER DISEASE 07/20/2008   IRON DEFICIENCY ANEMIA SECONDARY TO BLOOD LOSS 07/08/2008   ENDOMETRIOSIS 07/08/2008    Past Surgical History:  Procedure Laterality Date   bladder staple  CYSTOSTOMY W/ BLADDER BIOPSY     SHOULDER SURGERY  2008   right   TOTAL ABDOMINAL HYSTERECTOMY  2004   TUBAL LIGATION  2001   WRIST SURGERY  2008   right     OB History     Gravida  4   Para  3   Term  3   Preterm  0   AB  1   Living  3      SAB  1   IAB  0   Ectopic  0   Multiple  0   Live Births              Family History  Problem Relation Age of Onset   Diabetes Mother    Hypertension Father    Heart  attack Father    Diabetes Father    Alcohol abuse Other    Hypertension Other    Hyperlipidemia Other    Kidney disease Other    Colon cancer Other        uncle   Prostate cancer Other        uncles and grandfather   Cirrhosis Other        both grandmother and grandfather   Lung cancer Other    Heart disease Other    Thyroid disease Other    Colon polyps Other    Liver disease Other    Arthritis Maternal Grandmother    Heart failure Maternal Grandmother    Kidney failure Maternal Grandmother    Diabetes Daughter        type 2   Hypertension Daughter    Psoriasis Sister    Arthritis Sister    Lung cancer Maternal Grandfather    Heart disease Maternal Grandfather    Diabetes Paternal Grandmother    Glaucoma Paternal Grandmother    Hypertension Paternal Grandmother    Heart failure Paternal Grandmother    Diabetes Paternal Grandfather    Hypertension Paternal Grandfather     Social History   Tobacco Use   Smoking status: Never   Smokeless tobacco: Never  Vaping Use   Vaping Use: Never used  Substance Use Topics   Alcohol use: No   Drug use: No    Home Medications Prior to Admission medications   Medication Sig Start Date End Date Taking? Authorizing Provider  azithromycin (ZITHROMAX) 250 MG tablet Take 1 tablet (250 mg total) by mouth daily. Take first 2 tablets together, then 1 every day until finished. 09/19/21  Yes [name], MD  albuterol (PROVENTIL HFA;VENTOLIN HFA) 108 (90 Base) MCG/ACT inhaler Inhale 2 puffs into the lungs every 4 (four) hours as needed for wheezing or shortness of breath.     [provider]  Blood Glucose Monitoring Suppl [facility] VERIO) w/Device KIT Use as advised 08/04/18   [name], MD  Blood Pressure Monitoring KIT Monitor blood pressure daily secondary to medications taking. 11/14/18   Skeet Latch, MD  budesonide-formoterol [facility]) 160-4.5 MCG/ACT inhaler Inhale 2 puffs into the lungs 2 (two) times  daily. 01/25/16   de Dios, Henrietta, MD  Cholecalciferol (D3 VITAMIN PO) Take by mouth daily.    [provider]  Continuous Blood Gluc Receiver (FREESTYLE LIBRE 2 READER) DEVI Use as instructed to check blood sugar 06/01/21   [name], MD  Continuous Blood Gluc Sensor (FREESTYLE LIBRE 2 SENSOR) MISC Use as instructed to check blood sugar. Change every 14 days 06/01/21   [name], MD  ergocalciferol (VITAMIN  D2) 1.25 MG (50000 UT) capsule Take 1 capsule (50,000 Units total) by mouth once a week. 12/26/18   [name], MD  glucose blood test strip Use as instructed 3x a day - One Touch Verio 08/04/18   [name], MD  insulin lispro (HUMALOG KWIKPEN) 200 UNIT/ML KwikPen INJECT 25-30 UNITS UNDER THE SKIN THREE TIMES DAILY BEFORE MEALS 11/10/20   [name], MD  Insulin NPH, Human,, Isophane, (HUMULIN N KWIKPEN) 100 UNIT/ML Kiwkpen INJECT 24 UNITS TWICE A DAY 07/11/21   [name], MD  Insulin Pen Needle (BD PEN NEEDLE NANO 2ND GEN) 32G X 4 MM MISC Use to inject insulin - 5x a day 11/10/20   [name], MD  Lancet Devices (ONE TOUCH DELICA LANCING DEV) MISC Use to check sugar 2 times daily. 08/04/18   [name], MD  magnesium gluconate (MAGONATE) 30 MG tablet Take 30 mg by mouth 2 (two) times daily.    [provider]  methocarbamol (ROBAXIN) 500 MG tablet Take 1 tablet (500 mg total) by mouth 2 (two) times daily. 11/07/18   [name], PA-C  NONFORMULARY OR COMPOUNDED ITEM Glucose and cinnamon    [provider]  nortriptyline (PAMELOR) 10 MG capsule Take 1 capsule at bedtime for 7 days, then increase to 2 capsules at bedtime. 03/06/19   [name], DO  omeprazole (PRILOSEC) 20 MG capsule Take 1 capsule by mouth daily. 12/08/15   [provider]  pyridOXINE (VITAMIN B-6) 100 MG tablet Take 100 mg by mouth daily.    [provider]  rosuvastatin (CRESTOR) 10 MG tablet Take 1 tablet (10 mg total) by  mouth daily. 06/19/21   [name], MD  Semaglutide, 1 MG/DOSE, (OZEMPIC, 1 MG/DOSE,) 4 MG/3ML SOPN Inject 1 mg into the skin once a week. 06/19/21   [name], MD  STEGLATRO 5 MG TABS Take 2 tablets by mouth daily. 09/22/19   [provider]     Allergies    Liraglutide, Codeine, Acetaminophen, Ibuprofen, Amoxicillin, Metformin, and Penicillins  Review of Systems   Review of Systems  Constitutional:  Positive for chills and fatigue. Negative for fever.  HENT:  Positive for congestion and ear pain. Negative for sore throat.   Eyes:  Negative for pain and visual disturbance.  Respiratory:  Positive for cough. Negative for shortness of breath.   Cardiovascular:  Negative for chest pain and palpitations.  Gastrointestinal:  Negative for abdominal pain and vomiting.  Genitourinary:  Negative for dysuria and hematuria.  Musculoskeletal:  Negative for arthralgias and back pain.  Skin:  Negative for color change and rash.  Neurological:  Negative for seizures and syncope.  All other systems reviewed and are negative.  Physical Exam Updated Vital Signs BP (!) 136/97    Pulse (!) 109    Temp 97.6 F (36.4 C)    Resp (!) 6    Ht _0  (1.448 m)    Wt 70.8 kg    SpO2 100%    BMI 33.76 kg/m   Physical Exam Vitals and nursing note reviewed.  Constitutional:      General: She is not in acute distress.    Appearance: She is well-developed.  HENT:     Head: Normocephalic and atraumatic.     Mouth/Throat:     Comments: Tonsillar erythema present, slight exudate noted, oropharynx patent Eyes:     Conjunctiva/sclera: Conjunctivae normal.  Cardiovascular:     Rate and Rhythm: Regular rhythm. Tachycardia present.  Heart sounds: No murmur heard. Pulmonary:     Effort: Pulmonary effort is normal. No respiratory distress.     Breath sounds: Normal breath sounds.  Abdominal:     Palpations: Abdomen is soft.     Tenderness: There is no abdominal tenderness.   Musculoskeletal:        General: No swelling.     Cervical back: Neck supple.  Skin:    General: Skin is warm and dry.     Capillary Refill: Capillary refill takes less than 2 seconds.  Neurological:     Mental Status: She is alert.  Psychiatric:        Mood and Affect: Mood normal.    ED Results / Procedures / Treatments   Labs (all labs ordered are listed, but only abnormal results are displayed) Labs Reviewed  BASIC METABOLIC PANEL - Abnormal; Notable for the following components:      Result Value   Glucose, Bld 200 (*)    All other components within normal limits  D-DIMER, QUANTITATIVE - Abnormal; Notable for the following components:   D-Dimer, Quant 1.28 (*)    All other components within normal limits  GROUP A STREP BY PCR  RESP PANEL BY RT-PCR (FLU A&B, COVID) ARPGX2  CBC WITH DIFFERENTIAL/PLATELET  URINALYSIS, ROUTINE W REFLEX MICROSCOPIC  TROPONIN I (HIGH SENSITIVITY)    EKG EKG Interpretation  Date/Time:  Tuesday September 19 2021 08:19:12 EST Ventricular Rate:  104 PR Interval:  137 QRS Duration: 80 QT Interval:  333 QTC Calculation: 438 R Axis:   16 Text Interpretation: Sinus tachycardia Borderline T wave abnormalities Confirmed by Madalyn Rob [phone]) on 09/19/2021 8:49:40 AM  Radiology CT Angio Chest PE W and/or Wo Contrast  Result Date: 09/19/2021 CLINICAL DATA:  Shortness of breath, hemoptysis, elevated D-dimer EXAM: CT ANGIOGRAPHY CHEST WITH CONTRAST TECHNIQUE: Multidetector CT imaging of the chest was performed using the standard protocol during bolus administration of intravenous contrast. Multiplanar CT image reconstructions and MIPs were obtained to evaluate the vascular anatomy. CONTRAST:  172m OMNIPAQUE IOHEXOL 350 MG/ML SOLN COMPARISON:  Same-day chest radiograph, CTA chest 03/05/2015 FINDINGS: Cardiovascular: There is adequate opacification of the pulmonary arteries to the segmental level. There is no evidence of pulmonary embolism. The  heart is not enlarged. There is no pericardial effusion. The thoracic aorta is normal in appearance. Mediastinum/Nodes: The imaged thyroid is unremarkable. The esophagus is grossly unremarkable. There is no mediastinal, hilar, or axillary lymphadenopathy. Lungs/Pleura: The trachea and central airways are patent. There is no focal consolidation or pulmonary edema. There is no pleural effusion or pneumothorax. There are no suspicious nodules. Upper Abdomen: The liver is diffusely hypoattenuating consistent with fatty infiltration. Musculoskeletal: There is no acute osseous abnormality or aggressive osseous lesion. Review of the MIP images confirms the above findings. IMPRESSION: No evidence of pulmonary embolism or other acute pathology in the chest. Electronically Signed   By: PValetta MoleM.D.   On: 09/19/2021 10:41   DG Chest Portable 1 View  Result Date: 09/19/2021 CLINICAL DATA:  Sore throat and congestion with hemoptysis. EXAM: PORTABLE CHEST 1 VIEW COMPARISON:  12/15/2017 FINDINGS: Normal heart size and mediastinal contours. No acute infiltrate or edema. No effusion or pneumothorax. No acute osseous findings. IMPRESSION: Negative chest. Electronically Signed   By: JJorje GuildM.D.   On: 09/19/2021 08:27    Procedures Procedures   Medications Ordered in ED Medications  sodium chloride 0.9 % bolus 1,000 mL (0 mLs Intravenous Stopped 09/19/21 1037)  lidocaine (XYLOCAINE)  2 % viscous mouth solution 15 mL (15 mLs Mouth/Throat Given 09/19/21 0845)  iohexol (OMNIPAQUE) 350 MG/ML injection 100 mL (100 mLs Intravenous Contrast Given 09/19/21 1020)    ED Course  I have reviewed the triage vital signs and the nursing notes.  Pertinent labs & imaging results that were available during my care of the patient were reviewed by me and considered in my medical decision making (see chart for details).    MDM Rules/Calculators/A&P                         40 year old lady presents to ER with concern  for multiple symptoms including cough, congestion, sore throat, ear pain.  On exam appears well in no distress, she does have some erythema and slight exudates in her posterior oropharynx.  Because she endorsed some shortness of breath with some hemoptysis, check labs, D-dimer and chest x-ray.  Work-up grossly reassuring except for mildly elevated D-dimer.  CTA chest obtained and negative for pulmonary embolism.  COVID-negative, strep negative.  Given exudates on exam concern for possible pharyngitis, will treat with antibiotics though viral etiology high on differential.  No further episodes of hemoptysis while in ER.  She remains well-appearing on reassessment, discussed results, will recommend supportive care, discharged home.     After the discussed management above, the patient was determined to be safe for discharge.  The patient was in agreement with this plan and all questions regarding their care were answered.  ED return precautions were discussed and the patient will return to the ED with any significant worsening of condition.   Final Clinical Impression(s) / ED Diagnoses Final diagnoses:  Pharyngitis, unspecified etiology    Rx / DC Orders ED Discharge Orders          Ordered    azithromycin (ZITHROMAX) 250 MG tablet  Daily        09/19/21 1105             [name], MD 09/19/21 1110

## 2021-09-19 NOTE — ED Triage Notes (Addendum)
Patient reports sore throat and ear painsince yesterday. Pt reports she is coughing and urine smells like ammonia. Reports being sick for one month and forgetting to drink

## 2021-09-19 NOTE — Discharge Instructions (Signed)
Taking antibiotic for possible throat infection.  Recommend drinking plenty of fluids.  Take Tylenol or Motrin as needed for pain.  Follow-up with your primary doctor.  If you develop chest pain, difficulty in breathing, vomiting, more episodes of blood in cough or other new concerning symptom, come back to ER for reassessment.

## 2021-09-21 ENCOUNTER — Ambulatory Visit (INDEPENDENT_AMBULATORY_CARE_PROVIDER_SITE_OTHER): Payer: Medicare PPO | Admitting: Psychology

## 2021-09-21 DIAGNOSIS — F411 Generalized anxiety disorder: Secondary | ICD-10-CM

## 2021-09-21 DIAGNOSIS — F331 Major depressive disorder, recurrent, moderate: Secondary | ICD-10-CM

## 2021-09-21 NOTE — Progress Notes (Signed)
[facility] Counselor/Therapist Progress Note  Patient ID: Lisa Cole, MRN: [id number],    Date: 09/21/2021  Time Spent: 60 minutes  Treatment Type: Individual Therapy  Reported Symptoms: worry, depression, physical pain  Mental Status Exam: Appearance:  Casual  Behavior: Agitated  Motor: Restlestness  Speech/Language:  Pressured  Affect: Full Range  Mood: anxious  Thought process: flight of ideas  Thought content:   WNL  Sensory/Perceptual disturbances:   WNL  Orientation: oriented to person, place, time/date, and situation  Attention: Good  Concentration: Fair  Memory: Candor of knowledge:  Good  Insight:   Fair  Judgment:  Poor  Impulse Control: Poor   Risk Assessment: Danger to Self:  No Self-injurious Behavior: No Danger to Others: No Duty to Warn:no Physical Aggression / Violence:No  Access to Firearms a concern: No  Gang Involvement:No   Subjective: The patient attended a face-to-face individual therapy session via video visit due to COVID-19.  The patient gave verbal consent for the video to be on WebEx.  The patient was in her home alone and the therapist was in the office.  The patient presents as pleasant and cooperative today.  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  The patient states that she has not been well and had to go to the emergency room and had to have some test run and is concerned about her heart rate.  The patient reports that she is trying to get well and is being treated for strep.  We talked about her working with herself on trying to remain calm and continue to use mindfulness and meditation to do this.  Provided supportive therapy and cognitive behavioral therapy. Interventions: Cognitive Behavioral Therapy and Mindfulness Meditation  Diagnosis:Major depressive disorder, recurrent episode, moderate (HCC)  Generalized anxiety disorder  Plan: Treatment  Plan  Strengths/Abilities:  intelligent, motivated   Treatment Preferences: Outpatient Individual therapy  Statement of Needs:  "I have anxiety and depression"  Symptoms:  Autonomic hyperactivity (e.g., palpitations, shortness of breath, dry mouth, trouble swallowing,  nausea, diarrhea).: (Status: improved). Excessive and/or unrealistic worry that  is difficult to control occurring more days than not for at least 6 months about a number of events or  activities.: (Status: improved). Hypervigilance (e.g., feeling constantly on  edge, experiencing concentration difficulties, having trouble falling or staying asleep, exhibiting a  general state of irritability).: (Status: improved). Motor tension (e.g.,  restlessness, tiredness, shakiness, muscle tension).: (Status: improved).  Problems Addressed:  Depression and Anxiety  Goals:  LTG:1. Enhance ability to effectively cope with the full variety of life's worries  and anxieties.  50% 2. Learn and implement coping skills that result in a reduction of anxiety  and worry, and improved daily functioning  50% 3. Reduce overall frequency, intensity, and duration of the anxiety so that  daily functioning is not impaired.  50% 4. Resolve the core conflict that is the source of anxiety.  10% 5. Stabilize anxiety level while increasing ability to function on a daily  20% STG: 1. Identify, challenge, and replace biased, fearful self-talk with positive, realistic, and empowering self talk  50% 2. Learn and implement problem-solving strategies for realistically addressing worries.  50%   3.Learn and implement calming skills to reduce overall anxiety and manage anxiety symptoms.  59%   Target Date:  10/13/2021 Frequency:  weekly to biweekly Modality: Individual therapy Interventions by Therapist: CBT, problem solving, EMDR, insight oriented and  mindfulness meditation  Patient has approved this treatment plan  Shizuo Biskup G Roiza Wiedel, [name]

## 2021-09-27 ENCOUNTER — Other Ambulatory Visit: Payer: Self-pay

## 2021-09-27 ENCOUNTER — Encounter: Payer: Self-pay | Admitting: Internal Medicine

## 2021-09-27 ENCOUNTER — Ambulatory Visit (INDEPENDENT_AMBULATORY_CARE_PROVIDER_SITE_OTHER): Payer: Medicare PPO | Admitting: Internal Medicine

## 2021-09-27 VITALS — BP 120/82 | HR 97 | Ht <= 58 in | Wt 154.4 lb

## 2021-09-27 DIAGNOSIS — E559 Vitamin D deficiency, unspecified: Secondary | ICD-10-CM | POA: Diagnosis not present

## 2021-09-27 DIAGNOSIS — E1165 Type 2 diabetes mellitus with hyperglycemia: Secondary | ICD-10-CM | POA: Diagnosis not present

## 2021-09-27 DIAGNOSIS — Z8639 Personal history of other endocrine, nutritional and metabolic disease: Secondary | ICD-10-CM

## 2021-09-27 DIAGNOSIS — Z794 Long term (current) use of insulin: Secondary | ICD-10-CM | POA: Diagnosis not present

## 2021-09-27 DIAGNOSIS — E038 Other specified hypothyroidism: Secondary | ICD-10-CM | POA: Diagnosis not present

## 2021-09-27 DIAGNOSIS — E063 Autoimmune thyroiditis: Secondary | ICD-10-CM

## 2021-09-27 LAB — POCT GLYCOSYLATED HEMOGLOBIN (HGB A1C): Hemoglobin A1C: 8.2 % — AB (ref 4.0–5.6)

## 2021-09-27 MED ORDER — HUMULIN N KWIKPEN 100 UNIT/ML ~~LOC~~ SUPN: PEN_INJECTOR | SUBCUTANEOUS | 3 refills | Status: DC
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
## 2021-09-27 NOTE — Patient Instructions (Addendum)
Please restart: - Metformin 1000 mg 1x a day  Increase: - Humulin N 16 units in am and 16 units at bedtime - Humalog 12-16 units 3x a day 15 min before meals  Continue: - Ozempic 1 mg weekly  Also, continue: - Crestor 10 mg daily (you can take this every other day if needed)  Please return in 3 months.

## 2021-09-28 ENCOUNTER — Ambulatory Visit: Payer: Medicare PPO | Admitting: Psychology

## 2021-10-05 ENCOUNTER — Ambulatory Visit (INDEPENDENT_AMBULATORY_CARE_PROVIDER_SITE_OTHER): Payer: Medicare PPO | Admitting: Psychology

## 2021-10-05 DIAGNOSIS — F331 Major depressive disorder, recurrent, moderate: Secondary | ICD-10-CM | POA: Diagnosis not present

## 2021-10-05 DIAGNOSIS — F411 Generalized anxiety disorder: Secondary | ICD-10-CM
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
## 2021-10-12 ENCOUNTER — Other Ambulatory Visit: Payer: Self-pay

## 2021-10-12 ENCOUNTER — Emergency Department (HOSPITAL_BASED_OUTPATIENT_CLINIC_OR_DEPARTMENT_OTHER)
Admission: EM | Admit: 2021-10-12 | Discharge: 2021-10-12 | Disposition: A | Discharge status: Home / Self Care | Payer: Medicare PPO | Attending: Emergency Medicine | Admitting: Emergency Medicine

## 2021-10-12 ENCOUNTER — Encounter (HOSPITAL_BASED_OUTPATIENT_CLINIC_OR_DEPARTMENT_OTHER): Payer: Self-pay

## 2021-10-12 DIAGNOSIS — Z794 Long term (current) use of insulin: Secondary | ICD-10-CM | POA: Insufficient documentation

## 2021-10-12 DIAGNOSIS — I10 Essential (primary) hypertension: Secondary | ICD-10-CM | POA: Diagnosis not present

## 2021-10-12 DIAGNOSIS — I2510 Atherosclerotic heart disease of native coronary artery without angina pectoris: Secondary | ICD-10-CM | POA: Diagnosis not present

## 2021-10-12 DIAGNOSIS — Z79899 Other long term (current) drug therapy: Secondary | ICD-10-CM | POA: Diagnosis not present

## 2021-10-12 DIAGNOSIS — J340 Abscess, furuncle and carbuncle of nose: Secondary | ICD-10-CM

## 2021-10-12 DIAGNOSIS — E119 Type 2 diabetes mellitus without complications: Secondary | ICD-10-CM | POA: Diagnosis not present

## 2021-10-12 DIAGNOSIS — R21 Rash and other nonspecific skin eruption: Secondary | ICD-10-CM | POA: Diagnosis present

## 2021-10-12 DIAGNOSIS — L03211 Cellulitis of face: Secondary | ICD-10-CM | POA: Insufficient documentation

## 2021-10-12 MED ORDER — CLINDAMYCIN HCL 150 MG PO CAPS
450.0000 mg | ORAL_CAPSULE | Freq: Once | ORAL | Status: AC
  Administered 2021-10-12: 450 mg via ORAL
  Filled 2021-10-12: qty 3

## 2021-10-12 MED ORDER — CLINDAMYCIN HCL 150 MG PO CAPS: 450.0000 mg | ORAL_CAPSULE | Freq: Three times a day (TID) | ORAL | 0 refills | Status: DC

## 2021-10-12 MED ORDER — LIDOCAINE-EPINEPHRINE-TETRACAINE (LET) TOPICAL GEL
3.0000 mL | Freq: Once | TOPICAL | Status: DC
  Filled 2021-10-12: qty 3

## 2021-10-12 NOTE — Discharge Instructions (Signed)
Please follow-up with ENT within the next 3 to 5 days.  Please take clindamycin as prescribed 3 times daily.  Please take this with food as it can cause GI upset.  Please return to the emergency department for worsening symptoms.  Please stop taking Afrin.

## 2021-10-12 NOTE — ED Provider Notes (Signed)
Lisa Cole Provider Note   CSN: [id number] Arrival date & time: 10/12/21  1801     History Chief Complaint  Patient presents with   Rash    Lisa Cole is a 41 y.o. female with history of diabetes, coronary artery disease, and hypertension who presents to the emergency department with 1 week history of erythema, swelling, and purulent drainage from the tip of the nose.  Does not recall injuring the area.  Patient states he did have dental work done recently in which she was on antibiotics for.  She does report associated subjective fever and chills.   Rash     Home Medications Prior to Admission medications   Medication Sig Start Date End Date Taking? Authorizing Provider  clindamycin (CLEOCIN) 150 MG capsule Take 3 capsules (450 mg total) by mouth 3 (three) times daily. 10/12/21  Yes [name], Day [name], PA-C  albuterol (PROVENTIL HFA;VENTOLIN HFA) 108 (90 Base) MCG/ACT inhaler Inhale 2 puffs into the lungs every 4 (four) hours as needed for wheezing or shortness of breath.     [provider]  azithromycin (ZITHROMAX) 250 MG tablet Take 1 tablet (250 mg total) by mouth daily. Take first 2 tablets together, then 1 every day until finished. 09/19/21   [name], MD  Blood Glucose Monitoring Suppl [facility] VERIO) w/Device KIT Use as advised 08/04/18   [name], MD  Blood Pressure Monitoring KIT Monitor blood pressure daily secondary to medications taking. 11/14/18   Skeet Latch, MD  budesonide-formoterol [facility]) 160-4.5 MCG/ACT inhaler Inhale 2 puffs into the lungs 2 (two) times daily. 01/25/16   de Dios, [name], MD  Cholecalciferol (D3 VITAMIN PO) Take by mouth daily.    [provider]  Continuous Blood Gluc Receiver (FREESTYLE LIBRE 2 READER) DEVI Use as instructed to check blood sugar 06/01/21   [name], MD  Continuous Blood Gluc Sensor (FREESTYLE LIBRE 2 SENSOR) MISC Use as instructed to  check blood sugar. Change every 14 days 06/01/21   [name], MD  ergocalciferol (VITAMIN D2) 1.25 MG (50000 UT) capsule Take 1 capsule (50,000 Units total) by mouth once a week. 12/26/18   [name], MD  glucose blood test strip Use as instructed 3x a day - One Touch Verio 08/04/18   [name], MD  insulin lispro (HUMALOG KWIKPEN) 200 UNIT/ML KwikPen INJECT 25-30 UNITS UNDER THE SKIN THREE TIMES DAILY BEFORE MEALS 11/10/20   [name], MD  Insulin NPH, Human,, Isophane, (HUMULIN N KWIKPEN) 100 UNIT/ML Kiwkpen INJECT 16 UNITS TWICE A DAY 09/27/21   [name], MD  Insulin Pen Needle (BD PEN NEEDLE NANO 2ND GEN) 32G X 4 MM MISC Use to inject insulin - 5x a day 11/10/20   [name], MD  Lancet Devices (ONE TOUCH DELICA LANCING DEV) MISC Use to check sugar 2 times daily. 08/04/18   [name], MD  magnesium gluconate (MAGONATE) 30 MG tablet Take 30 mg by mouth 2 (two) times daily.    [provider]  methocarbamol (ROBAXIN) 500 MG tablet Take 1 tablet (500 mg total) by mouth 2 (two) times daily. 11/07/18   [name], PA-C  NONFORMULARY OR COMPOUNDED ITEM Glucose and cinnamon    [provider]  nortriptyline (PAMELOR) 10 MG capsule Take 1 capsule at bedtime for 7 days, then increase to 2 capsules at bedtime. 03/06/19   [name], DO  omeprazole (PRILOSEC) 20 MG capsule Take 1 capsule by mouth daily. 12/08/15  [provider]  pyridOXINE (VITAMIN B-6) 100 MG tablet Take 100 mg by mouth daily.    [provider]  rosuvastatin (CRESTOR) 10 MG tablet Take 1 tablet (10 mg total) by mouth daily. 06/19/21   [name], MD  Semaglutide, 1 MG/DOSE, (OZEMPIC, 1 MG/DOSE,) 4 MG/3ML SOPN Inject 1 mg into the skin once a week. 06/19/21   [name], MD      Allergies    Liraglutide, Codeine, Acetaminophen, Ibuprofen, Amoxicillin, Metformin, and Penicillins    Review of Systems   Review of Systems  Skin:   Positive for rash.  All other systems reviewed and are negative.  Physical Exam Updated Vital Signs BP (!) 142/89 (BP Location: Right Arm)    Pulse (!) 108    Temp 98.7 F (37.1 C) (Oral)    Resp 18    SpO2 100%  Physical Exam Vitals and nursing note reviewed.  Constitutional:      Appearance: Normal appearance.  HENT:     Head: Normocephalic and atraumatic.  Eyes:     General:        Right eye: No discharge.        Left eye: No discharge.     Conjunctiva/sclera: Conjunctivae normal.  Pulmonary:     Effort: Pulmonary effort is normal.  Skin:    General: Skin is warm and dry.     Comments: Area of induration, erythema, and purulence on the distal tip of the nose. Negative hutchinson sign.   Neurological:     General: No focal deficit present.     Mental Status: She is alert.  Psychiatric:        Mood and Affect: Mood normal.        Behavior: Behavior normal.    ED Results / Procedures / Treatments   Labs (all labs ordered are listed, but only abnormal results are displayed) Labs Reviewed - No data to display  EKG None  Radiology No results found.  Procedures Procedures   Medications Ordered in ED Medications  lidocaine-EPINEPHrine-tetracaine (LET) topical gel (0 mLs Topical Hold 10/12/21 1858)  clindamycin (CLEOCIN) capsule 450 mg (has no administration in time range)    ED Course/ Medical Decision Making/ A&P                           Medical Decision Making  Lisa Cole is a 41 y.o. female who presents to the emergency department with induration, erythema, and swelling of the distal tip of the nose.  This does appear to be cellulitic in nature.  I am suspicious of the previous dental procedure which likely seeded this infection.  There is a small pustule.  I did consider doing a needle aspiration but felt that it was not necessary at this time considering the risk first benefit.  I initially decided to put let on it for needle aspiration but ultimately  decided against it. I will give her a dose of clindamycin here in the department.  I will also give her 10 days worth of clindamycin to go home with with strict instructions to follow-up with ENT within the next 3 to 5 days.  I discussed strict return precautions with the patient to return to the emergency department.  Patient expressed full understanding.  I believe she is safe for outpatient follow-up.  She is safe for discharge.  Final Clinical Impression(s) / ED Diagnoses Final diagnoses:  Cellulitis of nose, external  Rx / DC Orders ED Discharge Orders          Ordered    clindamycin (CLEOCIN) 150 MG capsule  3 times daily,   Status:  Discontinued        10/12/21 1927    clindamycin (CLEOCIN) 150 MG capsule  3 times daily        10/12/21 1928              Myna Bright [place], [place] 10/12/21 1933    Wyvonnia Dusky, MD 10/13/21 1057

## 2021-10-12 NOTE — ED Triage Notes (Addendum)
Pt presents with cellulitis to her nose x1 week, started after going to the dentist. Pt reports it has improved but will not go away. Unable to touch it d/t severe pain.  Pt reports she just completed a round of ABX for sore throat

## 2021-10-13 ENCOUNTER — Ambulatory Visit (INDEPENDENT_AMBULATORY_CARE_PROVIDER_SITE_OTHER): Payer: Medicare PPO | Admitting: Psychology

## 2021-10-13 DIAGNOSIS — F331 Major depressive disorder, recurrent, moderate: Secondary | ICD-10-CM | POA: Diagnosis not present

## 2021-10-13 DIAGNOSIS — F411 Generalized anxiety disorder: Secondary | ICD-10-CM
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
## 2021-11-03 ENCOUNTER — Ambulatory Visit (INDEPENDENT_AMBULATORY_CARE_PROVIDER_SITE_OTHER): Payer: Medicare PPO | Admitting: Psychology

## 2021-11-03 DIAGNOSIS — F331 Major depressive disorder, recurrent, moderate: Secondary | ICD-10-CM | POA: Diagnosis not present

## 2021-11-03 DIAGNOSIS — F411 Generalized anxiety disorder: Secondary | ICD-10-CM | POA: Diagnosis not present
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
## 2021-11-10 ENCOUNTER — Ambulatory Visit (INDEPENDENT_AMBULATORY_CARE_PROVIDER_SITE_OTHER): Payer: Medicare PPO | Admitting: Psychology

## 2021-11-10 DIAGNOSIS — F331 Major depressive disorder, recurrent, moderate: Secondary | ICD-10-CM | POA: Diagnosis not present

## 2021-11-10 DIAGNOSIS — F411 Generalized anxiety disorder: Secondary | ICD-10-CM
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
## 2021-11-14 ENCOUNTER — Ambulatory Visit (INDEPENDENT_AMBULATORY_CARE_PROVIDER_SITE_OTHER): Payer: Medicare PPO | Admitting: Psychology

## 2021-11-14 DIAGNOSIS — F411 Generalized anxiety disorder: Secondary | ICD-10-CM

## 2021-11-14 DIAGNOSIS — F331 Major depressive disorder, recurrent, moderate: Secondary | ICD-10-CM | POA: Diagnosis not present
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
## 2021-11-21 ENCOUNTER — Ambulatory Visit (INDEPENDENT_AMBULATORY_CARE_PROVIDER_SITE_OTHER): Payer: Medicare PPO | Admitting: Psychology

## 2021-11-21 DIAGNOSIS — F411 Generalized anxiety disorder: Secondary | ICD-10-CM

## 2021-11-21 DIAGNOSIS — F331 Major depressive disorder, recurrent, moderate: Secondary | ICD-10-CM
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
## 2021-12-01 ENCOUNTER — Ambulatory Visit (INDEPENDENT_AMBULATORY_CARE_PROVIDER_SITE_OTHER): Payer: Medicare PPO | Admitting: Psychology

## 2021-12-01 DIAGNOSIS — F331 Major depressive disorder, recurrent, moderate: Secondary | ICD-10-CM | POA: Diagnosis not present

## 2021-12-01 DIAGNOSIS — F411 Generalized anxiety disorder: Secondary | ICD-10-CM
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
## 2021-12-12 ENCOUNTER — Ambulatory Visit (INDEPENDENT_AMBULATORY_CARE_PROVIDER_SITE_OTHER): Payer: Medicare PPO | Admitting: Psychology

## 2021-12-12 DIAGNOSIS — F411 Generalized anxiety disorder: Secondary | ICD-10-CM

## 2021-12-12 DIAGNOSIS — F331 Major depressive disorder, recurrent, moderate: Secondary | ICD-10-CM | POA: Diagnosis not present
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
## 2021-12-29 ENCOUNTER — Ambulatory Visit: Payer: Medicare PPO | Admitting: Psychology

## 2021-12-29 NOTE — Progress Notes (Signed)
Patient did not log in and cancelled appointment. ? ? ? ? ? ? ? ? ? ? ? ? ? ? ?Lisa Cole G Neysha Criado, [name] ?

## 2022-01-04 ENCOUNTER — Ambulatory Visit (INDEPENDENT_AMBULATORY_CARE_PROVIDER_SITE_OTHER): Payer: Medicare PPO | Admitting: Psychology

## 2022-01-04 DIAGNOSIS — F411 Generalized anxiety disorder: Secondary | ICD-10-CM

## 2022-01-04 DIAGNOSIS — F331 Major depressive disorder, recurrent, moderate: Secondary | ICD-10-CM

## 2022-01-04 NOTE — Progress Notes (Signed)
[facility] Counselor/Therapist Progress Note ? ?Patient ID: Lisa Cole, MRN: [id number],   ? ?Date: 01/04/2022 ? ?Time Spent: 60 minutes ? ?Treatment Type: Individual Therapy ? ?Reported Symptoms: worry, depression, physical pain ? ?Mental Status Exam: ?Appearance:  Casual  ?Behavior: Agitated  ?Motor: Restlestness  ?Speech/Language:  Pressured  ?Affect: Full Range  ?Mood: anxious  ?Thought process: flight of ideas  ?Thought content:   WNL  ?Sensory/Perceptual disturbances:   WNL  ?Orientation: oriented to person, place, time/date, and situation  ?Attention: Good  ?Concentration: Fair  ?Memory: WNL  ?Fund of knowledge:  Good  ?Insight:   Fair  ?Judgment:  Poor  ?Impulse Control: Poor  ? ?Risk Assessment: ?Danger to Self:  No ?Self-injurious Behavior: No ?Danger to Others: No ?Duty to Warn:no ?Physical Aggression / Violence:No  ?Access to Firearms a concern: No  ?Gang Involvement:No  ? ?Subjective: The patient attended a face-to-face individual therapy session via video visit due to COVID-19.  The patient gave verbal consent for the video to be on WebEx.  The patient was in her car alone and the therapist was in the office.  The patient presents as pleasant and cooperative.  The patient reports that she has been managing life however things have been difficult.  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  She reports that she is working and feels good about this and she was able to get involved with a group where she might be able to get a house.  We talked about how she can manage her emotions and she seems to be doing a pretty good job with that.  The patient talked about feeling anxious and it sounds like it may have been more related to her blood sugar than anxiety.  I recommended that she try to get that checked out as opposed to just assuming that it is anxiety.  The patient reports that many times she goes all night with and  sleeps and then gets up and does not eat for several hours. ? ?Interventions: Cognitive Behavioral Therapy and Mindfulness Meditation ? ?Diagnosis:Major depressive disorder, recurrent episode, moderate ([place]) ? ?Generalized anxiety disorder ? ?Plan: Treatment Plan ? ?Strengths/Abilities:  intelligent, motivated  ? ?Treatment Preferences: Outpatient Individual therapy ? ?Statement of Needs:  "I have anxiety and depression" ? ?Symptoms:  Autonomic hyperactivity (e.g., palpitations, shortness of breath, dry mouth, trouble swallowing,  ?nausea, diarrhea).: (Status: improved). Excessive and/or unrealistic worry that  ?is difficult to control occurring more days than not for at least 6 months about a number of events or  ?activities.: (Status: improved). Hypervigilance (e.g., feeling constantly on  ?edge, experiencing concentration difficulties, having trouble falling or staying asleep, exhibiting a  ?general state of irritability).: (Status: improved). Motor tension (e.g.,  ?restlessness, tiredness, shakiness, muscle tension).: (Status: improved). ? ?Problems Addressed:  Depression and Anxiety ? ?Goals:  ?LTG:1. Enhance ability to effectively cope with the full variety of life's worries  ?and anxieties.  50% ?2. Learn and implement coping skills that result in a reduction of anxiety  ?and worry, and improved daily functioning  50% ?3. Reduce overall frequency, intensity, and duration of the anxiety so that  ?daily functioning is not impaired.  50% ?4. Resolve the core conflict that is the source of anxiety.  10% ?5. Stabilize anxiety level while increasing ability to function on a daily  20% ?STG: 1. Identify, challenge, and replace biased, fearful self-talk with positive, realistic, and empowering  self talk  50% ?2. Learn and implement problem-solving strategies for realistically addressing worries.  50% ?  3.Learn and implement calming skills to reduce overall anxiety and manage anxiety symptoms.  59% ?  ?Target Date:   10/13/2022 ?Frequency:  weekly to biweekly ?Modality: Individual therapy ?Interventions by Therapist: CBT, problem solving, EMDR, insight oriented and mindfulness meditation ? ?Patient has approved this treatment plan ? ?Won Kreuzer G Aza Dantes, [name] ? ? ? ? ? ? ? ? ? ? ? ? ? ? ? ? ? ?Tanecia Mccay G Omer Monter, [name] ? ? ? ? ? ? ? ? ? ? ? ? ? ? ?Uvaldo Rybacki G Waver Dibiasio, [name] ? ? ? ? ? ? ? ? ? ? ? ? ? ? ?Kelissa Merlin G Salena Ortlieb, [name] ? ? ? ? ? ? ? ? ? ? ? ? ? ? ?Lucien Budney G Cicero Noy, [name] ? ? ? ? ? ? ? ? ? ? ? ? ? ? ?Elzena Muston G Vincent Streater, [name] ? ? ? ? ? ? ? ? ? ? ? ? ? ? ?Avaley Coop G Glendi Mohiuddin, [name] ? ? ? ? ? ? ? ? ? ? ? ? ? ? ?Durrell Barajas G Fitzroy Mikami, [name] ? ? ? ? ? ? ? ? ? ? ? ? ? ? ?Emile Kyllo G Zettie Gootee, [name] ? ? ? ? ? ? ? ? ? ? ? ? ? ? ?Saliyah Gillin G Karlena Luebke, [name] ?

## 2022-01-05 ENCOUNTER — Ambulatory Visit: Payer: Medicare PPO | Admitting: Internal Medicine

## 2022-01-09 ENCOUNTER — Encounter: Payer: Self-pay | Admitting: Internal Medicine

## 2022-01-09 ENCOUNTER — Ambulatory Visit (INDEPENDENT_AMBULATORY_CARE_PROVIDER_SITE_OTHER): Payer: Medicare PPO | Admitting: Internal Medicine

## 2022-01-09 VITALS — BP 120/80 | HR 100 | Ht <= 58 in | Wt 160.8 lb

## 2022-01-09 DIAGNOSIS — Z794 Long term (current) use of insulin: Secondary | ICD-10-CM | POA: Diagnosis not present

## 2022-01-09 DIAGNOSIS — E559 Vitamin D deficiency, unspecified: Secondary | ICD-10-CM

## 2022-01-09 DIAGNOSIS — E1165 Type 2 diabetes mellitus with hyperglycemia: Secondary | ICD-10-CM

## 2022-01-09 DIAGNOSIS — E038 Other specified hypothyroidism: Secondary | ICD-10-CM

## 2022-01-09 DIAGNOSIS — E063 Autoimmune thyroiditis: Secondary | ICD-10-CM

## 2022-01-09 LAB — POCT GLYCOSYLATED HEMOGLOBIN (HGB A1C): Hemoglobin A1C: 8.3 % — AB (ref 4.0–5.6)

## 2022-01-09 MED ORDER — METFORMIN HCL 1000 MG PO TABS: 1000.0000 mg | ORAL_TABLET | Freq: Every day | ORAL | 3 refills | Status: DC
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
## 2022-01-09 NOTE — Patient Instructions (Addendum)
Please restart: ?- Metformin 1000 mg 1x a day with dinner ? ?Use: ?- Humulin N 16 units in am and 16 units at bedtime ?- Humalog 12-16 units 3x a day 15 min before meals or sweet drinks ? ?Restart: ?- Ozempic 1 mg weekly ? ?Please return in 3-4 months. ?

## 2022-01-09 NOTE — Addendum Note (Signed)
Addended by: Lauralyn Primes on: 01/09/2022 03:36 PM ? ? Modules accepted: Orders ? ?

## 2022-01-10 ENCOUNTER — Ambulatory Visit (INDEPENDENT_AMBULATORY_CARE_PROVIDER_SITE_OTHER): Payer: Medicare PPO | Admitting: Psychology

## 2022-01-10 DIAGNOSIS — F411 Generalized anxiety disorder: Secondary | ICD-10-CM | POA: Diagnosis not present

## 2022-01-10 DIAGNOSIS — F331 Major depressive disorder, recurrent, moderate: Secondary | ICD-10-CM

## 2022-01-10 NOTE — Progress Notes (Signed)
[facility] Counselor/Therapist Progress Note ? ?Patient ID: Lisa Cole, MRN: [id number],   ? ?Date: 01/10/2022 ? ?Time Spent: 60 minutes ? ?Treatment Type: Individual Therapy ? ?Reported Symptoms: worry, depression, physical pain ? ?Mental Status Exam: ?Appearance:  Casual  ?Behavior: Agitated  ?Motor: Restlestness  ?Speech/Language:  Pressured  ?Affect: Full Range  ?Mood: anxious  ?Thought process: flight of ideas  ?Thought content:   WNL  ?Sensory/Perceptual disturbances:   WNL  ?Orientation: oriented to person, place, time/date, and situation  ?Attention: Good  ?Concentration: Fair  ?Memory: WNL  ?Fund of knowledge:  Good  ?Insight:   Fair  ?Judgment:  Poor  ?Impulse Control: Poor  ? ?Risk Assessment: ?Danger to Self:  No ?Self-injurious Behavior: No ?Danger to Others: No ?Duty to Warn:no ?Physical Aggression / Violence:No  ?Access to Firearms a concern: No  ?Gang Involvement:No  ? ?Subjective: The patient attended a face-to-face individual therapy session via video visit due to COVID-19.  The patient gave verbal consent for the video to be on WebEx.  The patient was in her home alone and the therapist was in the office.  The patient presents as pleasant and cooperative.  The patient reports that she is working and living her work.  She seems to be in a much better head space today than she has been in the past.  The patient still catastrophizes about things that are going on in her life.  The patient reports that she is having some issues with her children and also with the lawsuits but she is doing better with her relationship and also work life balance.  We continued to talk about how she can maintain her serenity and also deal with things as they come. ? ?Interventions: Cognitive Behavioral Therapy and Mindfulness Meditation ? ?Diagnosis:Major depressive disorder, recurrent episode, moderate ([place]) ? ?Generalized anxiety disorder ? ?Plan: Treatment Plan ? ?Strengths/Abilities:   intelligent, motivated  ? ?Treatment Preferences: Outpatient Individual therapy ? ?Statement of Needs:  "I have anxiety and depression" ? ?Symptoms:  Autonomic hyperactivity (e.g., palpitations, shortness of breath, dry mouth, trouble swallowing,  ?nausea, diarrhea).: (Status: improved). Excessive and/or unrealistic worry that  ?is difficult to control occurring more days than not for at least 6 months about a number of events or  ?activities.: (Status: improved). Hypervigilance (e.g., feeling constantly on  ?edge, experiencing concentration difficulties, having trouble falling or staying asleep, exhibiting a  ?general state of irritability).: (Status: improved). Motor tension (e.g.,  ?restlessness, tiredness, shakiness, muscle tension).: (Status: improved). ? ?Problems Addressed:  Depression and Anxiety ? ?Goals:  ?LTG:1. Enhance ability to effectively cope with the full variety of life's worries  ?and anxieties.  50% ?2. Learn and implement coping skills that result in a reduction of anxiety  ?and worry, and improved daily functioning  50% ?3. Reduce overall frequency, intensity, and duration of the anxiety so that  ?daily functioning is not impaired.  50% ?4. Resolve the core conflict that is the source of anxiety.  10% ?5. Stabilize anxiety level while increasing ability to function on a daily  20% ?STG: 1. Identify, challenge, and replace biased, fearful self-talk with positive, realistic, and empowering self talk  50% ?2. Learn and implement problem-solving strategies for realistically addressing worries.  50% ?  3.Learn and implement calming skills to reduce overall anxiety and manage anxiety symptoms.  59% ?  ?Target Date:  10/13/2022 ?Frequency:  weekly to biweekly ?Modality: Individual therapy ?Interventions by Therapist: CBT, problem solving, EMDR, insight oriented and mindfulness meditation ? ?  Patient has approved this treatment plan ? ?Caryl Fate G Jaylena Holloway, [name] ? ? ? ? ? ? ? ? ? ? ? ? ? ? ? ? ? ?Dalary Hollar G  Wyllow Seigler, [name] ? ? ? ? ? ? ? ? ? ? ? ? ? ? ?Saydie Gerdts G Praneeth Bussey, [name] ? ? ? ? ? ? ? ? ? ? ? ? ? ? ?Aerionna Moravek G Deone Leifheit, [name] ? ? ? ? ? ? ? ? ? ? ? ? ? ? ?Thoren Hosang G Jamilette Suchocki, [name] ? ? ? ? ? ? ? ? ? ? ? ? ? ? ?Chaz Ronning G Tashari Schoenfelder, [name] ? ? ? ? ? ? ? ? ? ? ? ? ? ? ?Jidenna Figgs G Priscillia Fouch, [name] ? ? ? ? ? ? ? ? ? ? ? ? ? ? ?Jacoby Zanni G Darrel Gloss, [name] ? ? ? ? ? ? ? ? ? ? ? ? ? ? ?Raistlin Gum G Kelley Knoth, [name] ? ? ? ? ? ? ? ? ? ? ? ? ? ? ?Timica Marcom G Montrey Buist, [name] ? ? ? ? ? ? ? ? ? ? ? ? ? ? ?Walterine Amodei G Jaelyn Cloninger, [name] ?

## 2022-01-11 ENCOUNTER — Encounter: Payer: Self-pay | Admitting: Internal Medicine

## 2022-01-11 DIAGNOSIS — E1165 Type 2 diabetes mellitus with hyperglycemia: Secondary | ICD-10-CM

## 2022-01-12 MED ORDER — OZEMPIC (1 MG/DOSE) 4 MG/3ML ~~LOC~~ SOPN: 1.0000 mg | PEN_INJECTOR | SUBCUTANEOUS | 3 refills | Status: DC

## 2022-01-25 ENCOUNTER — Ambulatory Visit (INDEPENDENT_AMBULATORY_CARE_PROVIDER_SITE_OTHER): Payer: Medicare PPO | Admitting: Psychology

## 2022-01-25 DIAGNOSIS — F411 Generalized anxiety disorder: Secondary | ICD-10-CM

## 2022-01-25 DIAGNOSIS — F331 Major depressive disorder, recurrent, moderate: Secondary | ICD-10-CM | POA: Diagnosis not present

## 2022-01-25 NOTE — Progress Notes (Signed)
[facility] Counselor/Therapist Progress Note ? ?Patient ID: Lisa Cole, MRN: [id number],   ? ?Date: 01/25/2022 ? ?Time Spent: 60 minutes ? ?Treatment Type: Individual Therapy ? ?Reported Symptoms: worry, depression, physical pain ? ?Mental Status Exam: ?Appearance:  Casual  ?Behavior: Agitated  ?Motor: Restlestness  ?Speech/Language:  Pressured  ?Affect: Full Range  ?Mood: blunted  ?Thought process: normal  ?Thought content:   WNL  ?Sensory/Perceptual disturbances:   WNL  ?Orientation: oriented to person, place, time/date, and situation  ?Attention: Good  ?Concentration: Fair  ?Memory: WNL  ?Fund of knowledge:  Good  ?Insight:   Fair  ?Judgment:  Poor  ?Impulse Control: Poor  ? ?Risk Assessment: ?Danger to Self:  No ?Self-injurious Behavior: No ?Danger to Others: No ?Duty to Warn:no ?Physical Aggression / Violence:No  ?Access to Firearms a concern: No  ?Gang Involvement:No  ? ?Subjective: The patient attended a face-to-face individual therapy session via video visit due to COVID-19.  The patient gave verbal consent for the video to be on WebEx.  The patient was in her home alone and the therapist was in the office.  The patient presents as pleasant and cooperative.  The patient reports that she just got back from [place] on Tuesday with her significant other and that went well.  We talked about that relationship and we did some problem solving on how she could be less reactive.  In addition she talked about her concerns about her son and what he is doing with his life.  She is very frustrated because he continues to want to go to college and he also has some job offers and keeps vacillating back and forth without making a commitment.  We did some problem solving on how to have a conversation with him as well. ? ?Interventions: Cognitive Behavioral Therapy and Mindfulness Meditation ? ?Diagnosis:Major depressive disorder, recurrent episode, moderate ([place]) ? ?Generalized anxiety  disorder ? ?Plan: Treatment Plan ? ?Strengths/Abilities:  intelligent, motivated  ? ?Treatment Preferences: Outpatient Individual therapy ? ?Statement of Needs:  "I have anxiety and depression" ? ?Symptoms:  Autonomic hyperactivity (e.g., palpitations, shortness of breath, dry mouth, trouble swallowing,  ?nausea, diarrhea).: (Status: improved). Excessive and/or unrealistic worry that  ?is difficult to control occurring more days than not for at least 6 months about a number of events or  ?activities.: (Status: improved). Hypervigilance (e.g., feeling constantly on  ?edge, experiencing concentration difficulties, having trouble falling or staying asleep, exhibiting a  ?general state of irritability).: (Status: improved). Motor tension (e.g.,  ?restlessness, tiredness, shakiness, muscle tension).: (Status: improved). ? ?Problems Addressed:  Depression and Anxiety ? ?Goals:  ?LTG:1. Enhance ability to effectively cope with the full variety of life's worries  ?and anxieties.  50% ?2. Learn and implement coping skills that result in a reduction of anxiety  ?and worry, and improved daily functioning  50% ?3. Reduce overall frequency, intensity, and duration of the anxiety so that  ?daily functioning is not impaired.  50% ?4. Resolve the core conflict that is the source of anxiety.  10% ?5. Stabilize anxiety level while increasing ability to function on a daily  20% ?STG: 1. Identify, challenge, and replace biased, fearful self-talk with positive, realistic, and empowering self talk  50% ?2. Learn and implement problem-solving strategies for realistically addressing worries.  50% ?  3.Learn and implement calming skills to reduce overall anxiety and manage anxiety symptoms.  59% ?  ?Target Date:  10/13/2022 ?Frequency:  weekly to biweekly ?Modality: Individual therapy ?Interventions by Therapist: CBT,  problem solving, EMDR, insight oriented and mindfulness meditation ? ?Patient has approved this treatment plan ? ?[name], [name] ? ? ? ? ? ? ? ? ? ? ? ? ? ? ? ? ? ?[name], [name] ? ? ? ? ? ? ? ? ? ? ? ? ? ? ?[name], [name] ? ? ? ? ? ? ? ? ? ? ? ? ? ? ?[name], [name] ? ? ? ? ? ? ? ? ? ? ? ? ? ? ?[name], [name] ? ? ? ? ? ? ? ? ? ? ? ? ? ? ?[name], [name] ? ? ? ? ? ? ? ? ? ? ? ? ? ? ?[name], [name] ? ? ? ? ? ? ? ? ? ? ? ? ? ? ?[name], [name] ? ? ? ? ? ? ? ? ? ? ? ? ? ? ?[name], [name] ? ? ? ? ? ? ? ? ? ? ? ? ? ? ?[name], [name] ? ? ? ? ? ? ? ? ? ? ? ? ? ? ?[name], [name] ? ? ? ? ? ? ? ? ? ? ? ? ? ? ?[name], [name] ?

## 2022-01-31 ENCOUNTER — Ambulatory Visit (INDEPENDENT_AMBULATORY_CARE_PROVIDER_SITE_OTHER): Payer: Medicare PPO | Admitting: Psychology

## 2022-01-31 DIAGNOSIS — F411 Generalized anxiety disorder: Secondary | ICD-10-CM | POA: Diagnosis not present

## 2022-01-31 DIAGNOSIS — F331 Major depressive disorder, recurrent, moderate: Secondary | ICD-10-CM | POA: Diagnosis not present

## 2022-01-31 NOTE — Progress Notes (Signed)
[facility] Counselor/Therapist Progress Note ? ?Patient ID: Lisa Cole, MRN: [id number],   ? ?Date: 01/31/2022 ? ?Time Spent: 60 minutes ? ?Treatment Type: Individual Therapy ? ?Reported Symptoms: worry, depression, physical pain ? ?Mental Status Exam: ?Appearance:  Casual  ?Behavior: normal  ?Motor: normal  ?Speech/Language:  normal  ?Affect: Full Range  ?Mood: blunted  ?Thought process: normal  ?Thought content:   WNL  ?Sensory/Perceptual disturbances:   WNL  ?Orientation: oriented to person, place, time/date, and situation  ?Attention: Good  ?Concentration: Fair  ?Memory: WNL  ?Fund of knowledge:  Good  ?Insight:   Fair  ?Judgment:  Poor  ?Impulse Control: Poor  ? ?Risk Assessment: ?Danger to Self:  No ?Self-injurious Behavior: No ?Danger to Others: No ?Duty to Warn:no ?Physical Aggression / Violence:No  ?Access to Firearms a concern: No  ?Gang Involvement:No  ? ?Subjective: The patient attended a face-to-face individual therapy session via video visit due to COVID-19.  The patient gave verbal consent for the video to be on WebEx.  The patient was in her home alone and the therapist was in the office.  The patient presents as pleasant and cooperative.  The patient talked about her stressors regarding her lawsuit that she has going.  The patient reports that she feels like if she would have been in counseling she would have been struggling even more with trying to deal with this.  The patient seems to be managing things pretty well right now and is continuing to keep records and document conversations that she is having with her attorney.  I encouraged her to continue to do this as I think this is a necessary thing for her to be mindful of.  The patient also talked about her situation with her son and seems to be handling that well too. ? ?Interventions: Cognitive Behavioral Therapy and Mindfulness Meditation ? ?Diagnosis:Major depressive disorder, recurrent episode, moderate  ([place]) ? ?Generalized anxiety disorder ? ?Plan: Treatment Plan ? ?Strengths/Abilities:  intelligent, motivated  ? ?Treatment Preferences: Outpatient Individual therapy ? ?Statement of Needs:  "I have anxiety and depression" ? ?Symptoms:  Autonomic hyperactivity (e.g., palpitations, shortness of breath, dry mouth, trouble swallowing,  ?nausea, diarrhea).: (Status: improved). Excessive and/or unrealistic worry that  ?is difficult to control occurring more days than not for at least 6 months about a number of events or  ?activities.: (Status: improved). Hypervigilance (e.g., feeling constantly on  ?edge, experiencing concentration difficulties, having trouble falling or staying asleep, exhibiting a  ?general state of irritability).: (Status: improved). Motor tension (e.g.,  ?restlessness, tiredness, shakiness, muscle tension).: (Status: improved). ? ?Problems Addressed:  Depression and Anxiety ? ?Goals:  ?LTG:1. Enhance ability to effectively cope with the full variety of life's worries  ?and anxieties.  50% ?2. Learn and implement coping skills that result in a reduction of anxiety  ?and worry, and improved daily functioning  50% ?3. Reduce overall frequency, intensity, and duration of the anxiety so that  ?daily functioning is not impaired.  50% ?4. Resolve the core conflict that is the source of anxiety.  10% ?5. Stabilize anxiety level while increasing ability to function on a daily  20% ?STG: 1. Identify, challenge, and replace biased, fearful self-talk with positive, realistic, and empowering self talk  50% ?2. Learn and implement problem-solving strategies for realistically addressing worries.  50% ?  3.Learn and implement calming skills to reduce overall anxiety and manage anxiety symptoms.  59% ?  ?Target Date:  10/13/2022 ?Frequency:  weekly to biweekly ?Modality: Individual  therapy ?Interventions by Therapist: CBT, problem solving, EMDR, insight oriented and mindfulness meditation ? ?Patient has approved this  treatment plan ? ?Carlis Blanchard G Durant Scibilia, [name] ? ? ? ? ? ? ? ? ? ? ? ? ? ? ? ? ? ?Jaquarius Seder G Kinjal Neitzke, [name] ? ? ? ? ? ? ? ? ? ? ? ? ? ? ?Mohamedamin Nifong G Marri Mcneff, [name] ? ? ? ? ? ? ? ? ? ? ? ? ? ? ?Sherard Sutch G Raimi Guillermo, [name] ? ? ? ? ? ? ? ? ? ? ? ? ? ? ?Lannie Yusuf G Bitha Fauteux, [name] ? ? ? ? ? ? ? ? ? ? ? ? ? ? ?Roshana Shuffield G Ester Mabe, [name] ? ? ? ? ? ? ? ? ? ? ? ? ? ? ?Orchid Glassberg G Montray Kliebert, [name] ? ? ? ? ? ? ? ? ? ? ? ? ? ? ?Islah Eve G Leighla Chestnutt, [name] ? ? ? ? ? ? ? ? ? ? ? ? ? ? ?Tawney Vanorman G Isabella Roemmich, [name] ? ? ? ? ? ? ? ? ? ? ? ? ? ? ?Nickalas Mccarrick G [place] Beck, [name] ? ? ? ? ? ? ? ? ? ? ? ? ? ? ?Uel Davidow G Conway Fedora, [name] ? ? ? ? ? ? ? ? ? ? ? ? ? ? ?Harnoor Reta G Taedyn Glasscock, [name] ? ? ? ? ? ? ? ? ? ? ? ? ? ? ?Dreux Mcgroarty G Tait Balistreri, [name] ?

## 2022-02-09 ENCOUNTER — Ambulatory Visit (INDEPENDENT_AMBULATORY_CARE_PROVIDER_SITE_OTHER): Payer: Medicare PPO | Admitting: Psychology

## 2022-02-09 DIAGNOSIS — F411 Generalized anxiety disorder: Secondary | ICD-10-CM | POA: Diagnosis not present

## 2022-02-09 DIAGNOSIS — F331 Major depressive disorder, recurrent, moderate: Secondary | ICD-10-CM | POA: Diagnosis not present

## 2022-02-09 NOTE — Progress Notes (Signed)
[facility] Counselor/Therapist Progress Note  Patient ID: Lisa Cole, MRN: [id number],    Date: 02/09/2022  Time Spent: 60 minutes  Treatment Type: Individual Therapy  Reported Symptoms: worry, depression, physical pain  Mental Status Exam: Appearance:  Casual  Behavior: normal  Motor: normal  Speech/Language:  normal  Affect: Full Range  Mood: blunted  Thought process: normal  Thought content:   WNL  Sensory/Perceptual disturbances:   WNL  Orientation: oriented to person, place, time/date, and situation  Attention: Good  Concentration: Fair  Memory: [place] of knowledge:  Good  Insight:   Fair  Judgment:  Poor  Impulse Control: Poor   Risk Assessment: Danger to Self:  No Self-injurious Behavior: No Danger to Others: No Duty to Warn:no Physical Aggression / Violence:No  Access to Firearms a concern: No  Gang Involvement:No   Subjective: The patient attended a face-to-face individual therapy session via video visit.  The patient gave verbal consent for the video to be on WebEx.  The patient was in her home alone and the therapist was in the office.  The patient presents as pleasant and cooperative.  The patient reports that she is having chest pains.  I recommended that she go ahead and go to the Dr or the ER if the pain continues.  The patient talked about being stressed by the situation with her attorney and the lawsuit.  We talked about the need for her to overcome her fear of going to the Dr. About her heart and get checked.  The patient also talked about the situation with her son and he seems to be doing a little better.  The patient is doing well in her relationship with her significant other.  We discussed coping strategies and also provided support.   Interventions: Cognitive Behavioral Therapy and Mindfulness Meditation  Diagnosis:Major depressive disorder, recurrent episode, moderate (HCC)  Generalized anxiety disorder  Plan: Treatment  Plan  Strengths/Abilities:  intelligent, motivated   Treatment Preferences: Outpatient Individual therapy  Statement of Needs:  "I have anxiety and depression"  Symptoms:  Autonomic hyperactivity (e.g., palpitations, shortness of breath, dry mouth, trouble swallowing,  nausea, diarrhea).: (Status: improved). Excessive and/or unrealistic worry that  is difficult to control occurring more days than not for at least 6 months about a number of events or  activities.: (Status: improved). Hypervigilance (e.g., feeling constantly on  edge, experiencing concentration difficulties, having trouble falling or staying asleep, exhibiting a  general state of irritability).: (Status: improved). Motor tension (e.g.,  restlessness, tiredness, shakiness, muscle tension).: (Status: improved).  Problems Addressed:  Depression and Anxiety  Goals:  LTG:1. Enhance ability to effectively cope with the full variety of life's worries  and anxieties.  50% 2. Learn and implement coping skills that result in a reduction of anxiety  and worry, and improved daily functioning  50% 3. Reduce overall frequency, intensity, and duration of the anxiety so that  daily functioning is not impaired.  50% 4. Resolve the core conflict that is the source of anxiety.  10% 5. Stabilize anxiety level while increasing ability to function on a daily  20% STG: 1. Identify, challenge, and replace biased, fearful self-talk with positive, realistic, and empowering self talk  50% 2. Learn and implement problem-solving strategies for realistically addressing worries.  50%   3.Learn and implement calming skills to reduce overall anxiety and manage anxiety symptoms.  59%   Target Date:  10/13/2022 Frequency:  weekly to biweekly Modality: Individual therapy Interventions by Therapist:  CBT, problem solving, EMDR, insight oriented and mindfulness meditation  Patient has approved this treatment plan  Lacie Landry G Mariaha Ellington,  [name]                  Kaytlin Burklow G Sahar Ryback, [name]               Wilsie Kern G Letoya Stallone, [name]               Sima Lindenberger G Davanta Meuser, [name]               Hannelore Bova G Dierks Wach, [name]               Shyasia Funches [employer], [name]               Capers Hagmann [employer], [name]               Lura Falor [employer], [name]               Wilhelmina Hark [employer], [name]               Brittanni Cariker [employer], [name]               Omie Ferger [employer], [name]               Jeanae Whitmill [employer], [name]               Corby Vandenberghe G Edyn Popoca, [place], [name]

## 2022-02-14 ENCOUNTER — Ambulatory Visit (INDEPENDENT_AMBULATORY_CARE_PROVIDER_SITE_OTHER): Payer: Medicare PPO | Admitting: Psychology

## 2022-02-14 DIAGNOSIS — F331 Major depressive disorder, recurrent, moderate: Secondary | ICD-10-CM

## 2022-02-14 DIAGNOSIS — F411 Generalized anxiety disorder: Secondary | ICD-10-CM | POA: Diagnosis not present
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
## 2022-03-02 ENCOUNTER — Ambulatory Visit (INDEPENDENT_AMBULATORY_CARE_PROVIDER_SITE_OTHER): Payer: Medicare PPO | Admitting: Psychology

## 2022-03-02 DIAGNOSIS — F411 Generalized anxiety disorder: Secondary | ICD-10-CM

## 2022-03-02 DIAGNOSIS — F331 Major depressive disorder, recurrent, moderate: Secondary | ICD-10-CM

## 2022-03-02 NOTE — Progress Notes (Signed)
[facility] Counselor/Therapist Progress Note  Patient ID: Lisa Cole, MRN: [id number],    Date: 03/02/2022  Time Spent: 50 minutes  Treatment Type: Individual Therapy  Reported Symptoms: worry, depression, physical pain  Mental Status Exam: Appearance:  Casual  Behavior: normal  Motor: normal  Speech/Language:  normal  Affect: Full Range  Mood: blunted  Thought process: normal  Thought content:   WNL  Sensory/Perceptual disturbances:   WNL  Orientation: oriented to person, place, time/date, and situation  Attention: Good  Concentration: Fair  Memory: [place] of knowledge:  Good  Insight:   Fair  Judgment:  Poor  Impulse Control: Poor   Risk Assessment: Danger to Self:  No Self-injurious Behavior: No Danger to Others: No Duty to Warn:no Physical Aggression / Violence:No  Access to Firearms a concern: No  Gang Involvement:No   Subjective: The patient attended a face-to-face individual therapy session via video visit.  The patient gave verbal consent for the video to be on WebEx.  The patient was in her car alone and the therapist was in the office.  The patient presents with a blunted affect and mood is depressed.  The patient reports that her cousin is in the hospital on life support and is not expected to make it because she took an overdose.  We processed her feelings about this and about what was happening with with her cousin in the past.  The patient reports that she tried to help her as much as she could.  We also talked about her frustrations with the lawsuit that she has going on and apparently there is a deadline in August that the attorney must meet and they do not have the case on the other attorneys schedule.  I encouraged the patient to continue to maintain contact so that she can make sure that her attorney is aware and working towards getting the situation resolved.  The patient also talked about going to the beach in the next week and we  talked about her being able to try to relax and not allow herself to get real stressed out by the people that are going with her.  Interventions: Cognitive Behavioral Therapy and Mindfulness Meditation  Diagnosis:Major depressive disorder, recurrent episode, moderate (HCC)  Generalized anxiety disorder  Plan: Treatment Plan  Strengths/Abilities:  intelligent, motivated   Treatment Preferences: Outpatient Individual therapy  Statement of Needs:  "I have anxiety and depression"  Symptoms:  Autonomic hyperactivity (e.g., palpitations, shortness of breath, dry mouth, trouble swallowing,  nausea, diarrhea).: (Status: improved). Excessive and/or unrealistic worry that  is difficult to control occurring more days than not for at least 6 months about a number of events or  activities.: (Status: improved). Hypervigilance (e.g., feeling constantly on  edge, experiencing concentration difficulties, having trouble falling or staying asleep, exhibiting a  general state of irritability).: (Status: improved). Motor tension (e.g.,  restlessness, tiredness, shakiness, muscle tension).: (Status: improved).  Problems Addressed:  Depression and Anxiety  Goals:  LTG:1. Enhance ability to effectively cope with the full variety of life's worries  and anxieties.  50% 2. Learn and implement coping skills that result in a reduction of anxiety  and worry, and improved daily functioning  50% 3. Reduce overall frequency, intensity, and duration of the anxiety so that  daily functioning is not impaired.  50% 4. Resolve the core conflict that is the source of anxiety.  10% 5. Stabilize anxiety level while increasing ability to function on a daily  20% STG:  1. Identify, challenge, and replace biased, fearful self-talk with positive, realistic, and empowering self talk  50% 2. Learn and implement problem-solving strategies for realistically addressing worries.  50%   3.Learn and implement calming skills to  reduce overall anxiety and manage anxiety symptoms.  59%   Target Date:  10/13/2022 Frequency:  weekly to biweekly Modality: Individual therapy Interventions by Therapist: CBT, problem solving, EMDR, insight oriented and mindfulness meditation  Patient has approved this treatment plan  Copper Basnett G Jamiel Goncalves, [name]                                                                                                                   Janis Cuffe G Rubee Vega, [name]

## 2022-03-09 ENCOUNTER — Ambulatory Visit (INDEPENDENT_AMBULATORY_CARE_PROVIDER_SITE_OTHER): Payer: Medicare PPO | Admitting: Psychology

## 2022-03-09 DIAGNOSIS — F411 Generalized anxiety disorder: Secondary | ICD-10-CM | POA: Diagnosis not present

## 2022-03-09 DIAGNOSIS — F331 Major depressive disorder, recurrent, moderate: Secondary | ICD-10-CM
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
## 2022-03-16 ENCOUNTER — Ambulatory Visit: Payer: Medicare PPO | Admitting: Psychology

## 2022-04-04 ENCOUNTER — Ambulatory Visit (INDEPENDENT_AMBULATORY_CARE_PROVIDER_SITE_OTHER): Payer: Medicare PPO | Admitting: Psychology

## 2022-04-04 DIAGNOSIS — F331 Major depressive disorder, recurrent, moderate: Secondary | ICD-10-CM | POA: Diagnosis not present

## 2022-04-04 DIAGNOSIS — F411 Generalized anxiety disorder: Secondary | ICD-10-CM

## 2022-04-04 NOTE — Progress Notes (Signed)
                Tashonda Pinkus G Erminie Foulks, [name]

## 2022-04-07 ENCOUNTER — Other Ambulatory Visit: Payer: Self-pay | Admitting: Internal Medicine

## 2022-04-18 ENCOUNTER — Ambulatory Visit (INDEPENDENT_AMBULATORY_CARE_PROVIDER_SITE_OTHER): Payer: Medicare PPO | Admitting: Psychology

## 2022-04-18 DIAGNOSIS — F4310 Post-traumatic stress disorder, unspecified: Secondary | ICD-10-CM | POA: Diagnosis not present

## 2022-04-18 DIAGNOSIS — F411 Generalized anxiety disorder: Secondary | ICD-10-CM | POA: Diagnosis not present

## 2022-04-18 DIAGNOSIS — F331 Major depressive disorder, recurrent, moderate: Secondary | ICD-10-CM
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
## 2022-04-20 ENCOUNTER — Encounter (INDEPENDENT_AMBULATORY_CARE_PROVIDER_SITE_OTHER): Payer: Medicare PPO | Admitting: Internal Medicine

## 2022-04-20 DIAGNOSIS — E1165 Type 2 diabetes mellitus with hyperglycemia: Secondary | ICD-10-CM

## 2022-04-20 NOTE — Telephone Encounter (Signed)

## 2022-04-26 ENCOUNTER — Encounter: Payer: Medicare PPO | Admitting: Psychology
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
## 2022-05-01 ENCOUNTER — Ambulatory Visit (INDEPENDENT_AMBULATORY_CARE_PROVIDER_SITE_OTHER): Payer: Medicare PPO | Admitting: Psychology

## 2022-05-01 DIAGNOSIS — F331 Major depressive disorder, recurrent, moderate: Secondary | ICD-10-CM

## 2022-05-01 DIAGNOSIS — F4310 Post-traumatic stress disorder, unspecified: Secondary | ICD-10-CM

## 2022-05-01 DIAGNOSIS — F411 Generalized anxiety disorder: Secondary | ICD-10-CM | POA: Diagnosis not present

## 2022-05-01 NOTE — Progress Notes (Signed)
                Lisa Cole G Lisa Troia, [name]

## 2022-05-01 NOTE — Progress Notes (Signed)
[place] Counselor/Therapist Progress Note  Patient ID: Lisa Cole, MRN: [id number],    Date: 05/01/2022  Time Spent: 60 minutes  Treatment Type: Individual Therapy  Reported Symptoms: worry, depression, physical pain  Mental Status Exam: Appearance:  Casual  Behavior: normal  Motor: normal  Speech/Language:  normal  Affect: Full Range  Mood: blunted  Thought process: normal  Thought content:   WNL  Sensory/Perceptual disturbances:   WNL  Orientation: oriented to person, place, time/date, and situation  Attention: Good  Concentration: Fair  Memory: [place] of knowledge:  Good  Insight:   Fair  Judgment:  Poor  Impulse Control: Poor   Risk Assessment: Danger to Self:  No Self-injurious Behavior: No Danger to Others: No Duty to Warn:no Physical Aggression / Violence:No  Access to Firearms a concern: No  Gang Involvement:No   Subjective: The patient attended a face-to-face individual therapy session via video visit.  The patient gave verbal consent for the video to be on Caregility.  The patient was in her car alone and the therapist was in the office.  The patient continues to be very upset and depressed.  The patient is very reactive now and is struggling to make ends meet financially.  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  She also seems to be getting her self worked up about every situation.  We talked about the need for her to practice her mindfulness and meditation and I scheduled an appointment with her later in the week to try to help her get herself in a better head space as she seems to be reactive and out of control with her emotions.  The patient was at the [employer] helping her son moving in and was frustrated with him.  She also reports that she has to go on August 14 for a deposition and she does not feel like she wants to do this.  I  explained to her that the deposition was a way of knowing that the case is getting resolved and that she is going to need to do a deposition so that she can go ahead and get the situation closed out.  We will continue to provide supportive therapy and reframing.   Interventions: Cognitive Behavioral Therapy and Mindfulness Meditation  Diagnosis:Major depressive disorder, recurrent episode, moderate (HCC)  Generalized anxiety disorder  PTSD (post-traumatic stress disorder)  Plan: Treatment Plan  Strengths/Abilities:  intelligent, motivated   Treatment Preferences: Outpatient Individual therapy  Statement of Needs:  "I have anxiety and depression"  Symptoms:  Autonomic hyperactivity (e.g., palpitations, shortness of breath, dry mouth, trouble swallowing,  nausea, diarrhea).: (Status: improved). Excessive and/or unrealistic worry that  is difficult to control occurring more days than not for at least 6 months about a number of events or  activities.: (Status: improved). Hypervigilance (e.g., feeling constantly on  edge, experiencing concentration difficulties, having trouble falling or staying asleep, exhibiting a  general state of irritability).: (Status: improved). Motor tension (e.g.,  restlessness, tiredness, shakiness, muscle tension).: (Status: improved).  Problems Addressed:  Depression and Anxiety  Goals:  LTG:1. Enhance ability to effectively cope with the full variety of life's worries  and anxieties.  50% 2. Learn and implement coping skills that result in a reduction of anxiety  and worry, and improved daily functioning  50% 3. Reduce overall frequency, intensity, and duration of the  anxiety so that  daily functioning is not impaired.  50% 4. Resolve the core conflict that is the source of anxiety.  10% 5. Stabilize anxiety level while increasing ability to function on a daily  20% STG: 1. Identify, challenge, and replace biased, fearful self-talk with positive,  realistic, and empowering self talk  50% 2. Learn and implement problem-solving strategies for realistically addressing worries.  50%   3.Learn and implement calming skills to reduce overall anxiety and manage anxiety symptoms.  59%   Target Date:  10/13/2022 Frequency:  weekly to biweekly Modality: Individual therapy Interventions by Therapist: CBT, problem solving, EMDR, insight oriented and mindfulness meditation  Patient has approved this treatment plan  Ricci Paff G Luanne Krzyzanowski, [name]                                                                                                                   Savannha Welle G Momodou Consiglio, [name]               Diar Berkel G Kidus Delman, [name]               Briauna Gilmartin G Naveh Rickles, [name]               Arvie Bartholomew G Froylan Hobby, [name]               Shaia Porath [employer], [name]

## 2022-05-04 ENCOUNTER — Ambulatory Visit (INDEPENDENT_AMBULATORY_CARE_PROVIDER_SITE_OTHER): Payer: Medicare PPO | Admitting: Psychology

## 2022-05-04 DIAGNOSIS — F331 Major depressive disorder, recurrent, moderate: Secondary | ICD-10-CM | POA: Diagnosis not present

## 2022-05-04 DIAGNOSIS — F4310 Post-traumatic stress disorder, unspecified: Secondary | ICD-10-CM

## 2022-05-04 DIAGNOSIS — F411 Generalized anxiety disorder: Secondary | ICD-10-CM

## 2022-05-06 NOTE — Progress Notes (Signed)
[place] Counselor/Therapist Progress Note  Patient ID: Lisa Cole, MRN: [id number],    Date: 05/04/2022  Time Spent: 60 minutes  Treatment Type: Individual Therapy  Reported Symptoms: worry, depression, physical pain  Mental Status Exam: Appearance:  Casual  Behavior: normal  Motor: normal  Speech/Language:  normal  Affect: Full Range  Mood: blunted  Thought process: normal  Thought content:   WNL  Sensory/Perceptual disturbances:   WNL  Orientation: oriented to person, place, time/date, and situation  Attention: Good  Concentration: Fair  Memory: [place] of knowledge:  Good  Insight:   Fair  Judgment:  Poor  Impulse Control: Poor   Risk Assessment: Danger to Self:  No Self-injurious Behavior: No Danger to Others: No Duty to Warn:no Physical Aggression / Violence:No  Access to Firearms a concern: No  Gang Involvement:No   Subjective: The patient attended a face-to-face individual therapy session in the office today.  The patient presents with a blunted affect and mood is anxious.  She reports that she went to her attorney's office today and talked to him about the deposition on Monday.  The patient talked about feeling overwhelmed by having to do the deposition.  We talked about how she feels stressed every time she has to choose a new healthcare provider.  She states that she has always been careful about who she chooses, but that is is more stressful now than ever because of what happened to her.  We talked about moving forward with EMDR when she is finished with the case around this.  Encouraged patient to continue to use meditation and mindfulness to help her manage her anxiety symptoms.   Interventions: Cognitive Behavioral Therapy and Mindfulness Meditation  Diagnosis:No diagnosis found.  Plan: Treatment Plan  Strengths/Abilities:  intelligent, motivated   Treatment Preferences: Outpatient Individual therapy  Statement of Needs:  "I  have anxiety and depression"  Symptoms:  Autonomic hyperactivity (e.g., palpitations, shortness of breath, dry mouth, trouble swallowing,  nausea, diarrhea).: (Status: improved). Excessive and/or unrealistic worry that  is difficult to control occurring more days than not for at least 6 months about a number of events or  activities.: (Status: improved). Hypervigilance (e.g., feeling constantly on  edge, experiencing concentration difficulties, having trouble falling or staying asleep, exhibiting a  general state of irritability).: (Status: improved). Motor tension (e.g.,  restlessness, tiredness, shakiness, muscle tension).: (Status: improved).  Problems Addressed:  Depression and Anxiety  Goals:  LTG:1. Enhance ability to effectively cope with the full variety of life's worries  and anxieties.  50% 2. Learn and implement coping skills that result in a reduction of anxiety  and worry, and improved daily functioning  50% 3. Reduce overall frequency, intensity, and duration of the anxiety so that  daily functioning is not impaired.  50% 4. Resolve the core conflict that is the source of anxiety.  10% 5. Stabilize anxiety level while increasing ability to function on a daily  20% STG: 1. Identify, challenge, and replace biased, fearful self-talk with positive, realistic, and empowering self talk  50% 2. Learn and implement problem-solving strategies for realistically addressing worries.  50%   3.Learn and implement calming skills to reduce overall anxiety and manage anxiety symptoms.  59%   Target Date:  10/13/2022 Frequency:  weekly to biweekly Modality: Individual therapy Interventions by Therapist: CBT, problem solving, EMDR, insight oriented and mindfulness meditation  Patient has approved this treatment plan  Renaye Janicki G Breyona Swander,  [name]  Calton Harshfield G Danzig Macgregor, [name]               Sincere Liuzzi G Arleigh Odowd, [name]               Tommaso Cavitt G Katricia Prehn, [name]               Jalisha Enneking G Chadrick Sprinkle, [name]               Xiara Knisley G Valmai Vandenberghe, [name]               Zollie Ellery G Tajuan Dufault, [name]

## 2022-05-10 ENCOUNTER — Ambulatory Visit: Payer: Medicare PPO | Admitting: Psychology

## 2022-05-14 ENCOUNTER — Ambulatory Visit: Payer: Medicare PPO | Admitting: Internal Medicine

## 2022-05-16 ENCOUNTER — Ambulatory Visit (INDEPENDENT_AMBULATORY_CARE_PROVIDER_SITE_OTHER): Payer: Medicare PPO | Admitting: Psychology

## 2022-05-16 DIAGNOSIS — F331 Major depressive disorder, recurrent, moderate: Secondary | ICD-10-CM

## 2022-05-16 DIAGNOSIS — F4310 Post-traumatic stress disorder, unspecified: Secondary | ICD-10-CM

## 2022-05-16 DIAGNOSIS — F411 Generalized anxiety disorder: Secondary | ICD-10-CM | POA: Diagnosis not present

## 2022-05-16 NOTE — Progress Notes (Signed)
[place] Counselor/Therapist Progress Note  Patient ID: Kevionna DAE ANTONUCCI, MRN: [id number],    Date: 05/16/2022  Time Spent: 45 minutes  Treatment Type: Individual Therapy  Reported Symptoms: worry, depression, physical pain  Mental Status Exam: Appearance:  Casual  Behavior: normal  Motor: normal  Speech/Language:  normal  Affect: Full Range  Mood: blunted  Thought process: normal  Thought content:   WNL  Sensory/Perceptual disturbances:   WNL  Orientation: oriented to person, place, time/date, and situation  Attention: Good  Concentration: Fair  Memory: [place] of knowledge:  Good  Insight:   Fair  Judgment:  Poor  Impulse Control: Poor   Risk Assessment: Danger to Self:  No Self-injurious Behavior: No Danger to Others: No Duty to Warn:no Physical Aggression / Violence:No  Access to Firearms a concern: No  Gang Involvement:No   Subjective: The patient attended a face-to-face individual therapy session via video visit on caregility today.  The patient gave verbal consent for the session to be on video on caregility.  The patient was in her home alone and therapist was in the office.  The patient presents with a blunted affect and mood is anxious.   The patient reports that she has been having a little bit of stress at work and we talked about that situation but it sounds like she handled it well.  The patient also talked about doing her deposition and being frustrated with her returning because he really has not been back involved in getting her what she needs from the circumstance.  The patient talked about using the tools that we have talked about in regard to changing her self talk and also meditation and mindfulness and these things have been helping her tremendously through this process.  I encouraged her to continue to do the best she can to keep herself as calm as possible and always to follow through in writing and what ever she does regarding these  situations with legal battles.  Interventions: Cognitive Behavioral Therapy and Mindfulness Meditation  Diagnosis:Major depressive disorder, recurrent episode, moderate (HCC)  Generalized anxiety disorder  PTSD (post-traumatic stress disorder)  Plan: Treatment Plan  Strengths/Abilities:  intelligent, motivated   Treatment Preferences: Outpatient Individual therapy  Statement of Needs:  "I have anxiety and depression"  Symptoms:  Autonomic hyperactivity (e.g., palpitations, shortness of breath, dry mouth, trouble swallowing,  nausea, diarrhea).: (Status: improved). Excessive and/or unrealistic worry that  is difficult to control occurring more days than not for at least 6 months about a number of events or  activities.: (Status: improved). Hypervigilance (e.g., feeling constantly on  edge, experiencing concentration difficulties, having trouble falling or staying asleep, exhibiting a  general state of irritability).: (Status: improved). Motor tension (e.g.,  restlessness, tiredness, shakiness, muscle tension).: (Status: improved).  Problems Addressed:  Depression and Anxiety  Goals:  LTG:1. Enhance ability to effectively cope with the full variety of life's worries  and anxieties.  50% 2. Learn and implement coping skills that result in a reduction of anxiety  and worry, and improved daily functioning  50% 3. Reduce overall frequency, intensity, and duration of the anxiety so that  daily functioning is not impaired.  50% 4. Resolve the core conflict that is the source of anxiety.  10% 5. Stabilize anxiety level while increasing ability to function on a daily  20% STG: 1. Identify, challenge, and replace biased, fearful self-talk with positive, realistic, and empowering self talk  50% 2. Learn and implement problem-solving strategies for realistically  addressing worries.  50%   3.Learn and implement calming skills to reduce overall anxiety and manage anxiety symptoms.  59%    Target Date:  10/13/2022 Frequency:  weekly to biweekly Modality: Individual therapy Interventions by Therapist: CBT, problem solving, EMDR, insight oriented and mindfulness meditation  Patient has approved this treatment plan  Sriansh Farra G Rocquel Askren, [name]                                                                                                                   Luretha Eberly G Naheem Mosco, [name]               Donel Osowski G Gwenith Tschida, [name]               Jarrick Fjeld G Qualyn Oyervides, [name]               Keyontae Huckeby [employer], [name]               Betha Shadix G Opie Maclaughlin, [name]               Julaine Zimny G Damyan Corne, [place], [name]

## 2022-05-24 ENCOUNTER — Telehealth: Payer: Self-pay

## 2022-05-24 NOTE — Telephone Encounter (Signed)
Inbound fax from DME supplier requesting form be completed and faxed with clinical notes. DME supplies ordered via [employer] through online portal.  

## 2022-05-25 ENCOUNTER — Ambulatory Visit (INDEPENDENT_AMBULATORY_CARE_PROVIDER_SITE_OTHER): Payer: Medicare PPO | Admitting: Psychology

## 2022-05-25 DIAGNOSIS — F331 Major depressive disorder, recurrent, moderate: Secondary | ICD-10-CM | POA: Diagnosis not present

## 2022-05-25 DIAGNOSIS — F411 Generalized anxiety disorder: Secondary | ICD-10-CM

## 2022-05-25 DIAGNOSIS — F4310 Post-traumatic stress disorder, unspecified: Secondary | ICD-10-CM

## 2022-05-25 NOTE — Progress Notes (Signed)
                Etola Mull G Aarianna Hoadley, [name]

## 2022-05-29 ENCOUNTER — Ambulatory Visit (INDEPENDENT_AMBULATORY_CARE_PROVIDER_SITE_OTHER): Payer: Medicare PPO | Admitting: Psychology

## 2022-05-29 DIAGNOSIS — F411 Generalized anxiety disorder: Secondary | ICD-10-CM | POA: Diagnosis not present

## 2022-05-29 DIAGNOSIS — F4310 Post-traumatic stress disorder, unspecified: Secondary | ICD-10-CM

## 2022-05-29 DIAGNOSIS — F331 Major depressive disorder, recurrent, moderate: Secondary | ICD-10-CM

## 2022-05-29 NOTE — Progress Notes (Signed)
[facility] Counselor/Therapist Progress Note  Patient ID: Lisa Cole, MRN: [id number],    Date: 05/29/2022  Time Spent: 50 minutes  Treatment Type: Individual Therapy  Reported Symptoms: worry, depression, physical pain  Mental Status Exam: Appearance:  Casual  Behavior: normal  Motor: normal  Speech/Language:  normal  Affect: Full Range  Mood: blunted  Thought process: normal  Thought content:   WNL  Sensory/Perceptual disturbances:   WNL  Orientation: oriented to person, place, time/date, and situation  Attention: Good  Concentration: Fair  Memory: [place] of knowledge:  Good  Insight:   Fair  Judgment:  Poor  Impulse Control: Poor   Risk Assessment: Danger to Self:  No Self-injurious Behavior: No Danger to Others: No Duty to Warn:no Physical Aggression / Violence:No  Access to Firearms a concern: No  Gang Involvement:No   Subjective: The patient attended a face-to-face individual therapy session via video visit on caregility today.  The patient gave verbal consent for the session to be on video on caregility.  The patient was in her car alone and therapist was in the office.  The patient presents with a blunted affect and mood is anxious and depressed.   The patient reports that she has been reading and studying more in the Bible and with Lisa Cole.  The patient states that this has been very helpful in helping her remain calm and not be so anxious.  She reports that she goes to mediation tomorrow and is anxious about this.  I recommended that she continue to utilize the tools that we have talked about to help herself remain calm and handled the situation.  The patient does complain of some tightness in her chest and knows to go to the emergency department if she needs to be evaluated.  The patient seems to be manage things and managing things a little better with her money situation and does not seem to be as stressed with that.  Provided  supportive therapy and reminders to focus on good things and positive cognitions.  Encouraged her using cognitive behavioral therapy.  Interventions: Cognitive Behavioral Therapy and Mindfulness Meditation  Diagnosis:Major depressive disorder, recurrent episode, moderate (HCC)  Generalized anxiety disorder  PTSD (post-traumatic stress disorder)  Plan: Treatment Plan  Strengths/Abilities:  intelligent, motivated   Treatment Preferences: Outpatient Individual therapy  Statement of Needs:  "I have anxiety and depression"  Symptoms:  Autonomic hyperactivity (e.g., palpitations, shortness of breath, dry mouth, trouble swallowing,  nausea, diarrhea).: (Status: improved). Excessive and/or unrealistic worry that  is difficult to control occurring more days than not for at least 6 months about a number of events or  activities.: (Status: improved). Hypervigilance (e.g., feeling constantly on  edge, experiencing concentration difficulties, having trouble falling or staying asleep, exhibiting a  general state of irritability).: (Status: improved). Motor tension (e.g.,  restlessness, tiredness, shakiness, muscle tension).: (Status: improved).  Problems Addressed:  Depression and Anxiety  Goals:  LTG:1. Enhance ability to effectively cope with the full variety of life's worries  and anxieties.  50% 2. Learn and implement coping skills that result in a reduction of anxiety  and worry, and improved daily functioning  50% 3. Reduce overall frequency, intensity, and duration of the anxiety so that  daily functioning is not impaired.  50% 4. Resolve the core conflict that is the source of anxiety.  10% 5. Stabilize anxiety level while increasing ability to function on a daily  20% STG: 1. Identify, challenge, and replace biased, fearful  self-talk with positive, realistic, and empowering self talk  50% 2. Learn and implement problem-solving strategies for realistically addressing worries.  50%    3.Learn and implement calming skills to reduce overall anxiety and manage anxiety symptoms.  59%   Target Date:  10/13/2022 Frequency:  weekly to biweekly Modality: Individual therapy Interventions by Therapist: CBT, problem solving, EMDR, insight oriented and mindfulness meditation  Patient has approved this treatment plan  Lisa Cole G Lisa Eley, [name]                                                                                                                                                                                                                            Lisa Cole G Lisa Stegman, [name]               Lisa Cole G Lisa Ortner, [name]

## 2022-06-07 ENCOUNTER — Ambulatory Visit (INDEPENDENT_AMBULATORY_CARE_PROVIDER_SITE_OTHER): Payer: Medicare PPO | Admitting: Psychology

## 2022-06-07 DIAGNOSIS — F331 Major depressive disorder, recurrent, moderate: Secondary | ICD-10-CM | POA: Diagnosis not present

## 2022-06-07 DIAGNOSIS — F411 Generalized anxiety disorder: Secondary | ICD-10-CM

## 2022-06-07 DIAGNOSIS — F4310 Post-traumatic stress disorder, unspecified: Secondary | ICD-10-CM

## 2022-06-07 NOTE — Progress Notes (Signed)
[place] Counselor/Therapist Progress Note  Patient ID: Fredda MONIA TIMMERS, MRN: [id number],    Date: 06/07/2022  Time Spent: 50 minutes  Treatment Type: Individual Therapy  Reported Symptoms: worry, depression, physical pain  Mental Status Exam: Appearance:  Casual  Behavior: normal  Motor: normal  Speech/Language:  normal  Affect: Full Range  Mood: blunted  Thought process: normal  Thought content:   WNL  Sensory/Perceptual disturbances:   WNL  Orientation: oriented to person, place, time/date, and situation  Attention: Good  Concentration: Fair  Memory: [place] of knowledge:  Good  Insight:   Fair  Judgment:  Poor  Impulse Control: Poor   Risk Assessment: Danger to Self:  No Self-injurious Behavior: No Danger to Others: No Duty to Warn:no Physical Aggression / Violence:No  Access to Firearms a concern: No  Gang Involvement:No   Subjective: The patient attended a face-to-face individual therapy session via video visit on caregility today.  The patient gave verbal consent for the session to be on video on caregility.  The patient was in her home alone and therapist was in the office.  The patient presents with a blunted affect and mood is anxious and depressed.   The patient reports that she is frustrated because she is having difficulty finding someone that can take a court case against her attorney.  She reports that she was told that she could not sue him because she signed the mediation papers.  I recommended that she look it up or speak to an attorney to find out why it is that she cannot do that.  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  We talked about needing to look at how to desensitize her to her triggers. Interventions: Cognitive Behavioral Therapy and Mindfulness Meditation  Diagnosis:Major depressive disorder, recurrent episode,  moderate (HCC)  Generalized anxiety disorder  PTSD (post-traumatic stress disorder)  Plan: Treatment Plan  Strengths/Abilities:  intelligent, motivated   Treatment Preferences: Outpatient Individual therapy  Statement of Needs:  "I have anxiety and depression"  Symptoms:  Autonomic hyperactivity (e.g., palpitations, shortness of breath, dry mouth, trouble swallowing,  nausea, diarrhea).: (Status: improved). Excessive and/or unrealistic worry that  is difficult to control occurring more days than not for at least 6 months about a number of events or  activities.: (Status: improved). Hypervigilance (e.g., feeling constantly on  edge, experiencing concentration difficulties, having trouble falling or staying asleep, exhibiting a  general state of irritability).: (Status: improved). Motor tension (e.g.,  restlessness, tiredness, shakiness, muscle tension).: (Status: improved).  Problems Addressed:  Depression and Anxiety  Goals:  LTG:1. Enhance ability to effectively cope with the full variety of life's worries  and anxieties.  50% 2. Learn and implement coping skills that result in a reduction of anxiety  and worry, and improved daily functioning  50% 3. Reduce overall frequency, intensity, and duration of the anxiety so that  daily functioning is not impaired.  50% 4. Resolve the core conflict that is the source of anxiety.  10% 5. Stabilize anxiety level while increasing ability to function on a daily  20% STG: 1. Identify, challenge, and replace biased, fearful self-talk with positive, realistic, and empowering self talk  50% 2. Learn and implement problem-solving strategies for realistically addressing worries.  50%   3.Learn and implement calming skills to reduce overall anxiety and manage anxiety symptoms.  59%  Target Date:  10/13/2022 Frequency:  weekly to biweekly Modality: Individual therapy Interventions by Therapist: CBT, problem solving, EMDR, insight oriented and  mindfulness meditation  Patient has approved this treatment plan  Andi Mahaffy G Joselyn Edling, [name]

## 2022-06-12 ENCOUNTER — Ambulatory Visit (INDEPENDENT_AMBULATORY_CARE_PROVIDER_SITE_OTHER): Payer: Medicare PPO | Admitting: Psychology

## 2022-06-12 DIAGNOSIS — F4310 Post-traumatic stress disorder, unspecified: Secondary | ICD-10-CM

## 2022-06-12 DIAGNOSIS — F331 Major depressive disorder, recurrent, moderate: Secondary | ICD-10-CM

## 2022-06-12 DIAGNOSIS — F411 Generalized anxiety disorder: Secondary | ICD-10-CM | POA: Diagnosis not present

## 2022-06-12 NOTE — Progress Notes (Signed)
                Lisa Cole G Sreshta Cressler, [name]

## 2022-06-15 ENCOUNTER — Other Ambulatory Visit: Payer: Self-pay | Admitting: Otolaryngology

## 2022-06-15 DIAGNOSIS — H93A9 Pulsatile tinnitus, unspecified ear: Secondary | ICD-10-CM

## 2022-06-20 ENCOUNTER — Ambulatory Visit
Admission: RE | Admit: 2022-06-20 | Discharge: 2022-06-20 | Disposition: A | Discharge status: Home / Self Care | Payer: Medicare PPO | Source: Ambulatory Visit | Attending: Otolaryngology | Admitting: Otolaryngology

## 2022-06-20 DIAGNOSIS — H93A9 Pulsatile tinnitus, unspecified ear: Secondary | ICD-10-CM

## 2022-06-29 ENCOUNTER — Encounter: Payer: Self-pay | Admitting: Internal Medicine

## 2022-07-05 ENCOUNTER — Ambulatory Visit (INDEPENDENT_AMBULATORY_CARE_PROVIDER_SITE_OTHER): Payer: Medicare PPO | Admitting: Psychology

## 2022-07-05 DIAGNOSIS — F331 Major depressive disorder, recurrent, moderate: Secondary | ICD-10-CM

## 2022-07-05 DIAGNOSIS — F4310 Post-traumatic stress disorder, unspecified: Secondary | ICD-10-CM | POA: Diagnosis not present

## 2022-07-05 DIAGNOSIS — F411 Generalized anxiety disorder: Secondary | ICD-10-CM

## 2022-07-05 NOTE — Progress Notes (Signed)
[place] Counselor/Therapist Progress Note  Patient ID: Lisa Cole, MRN: [id number],    Date: 07/05/2022  Time Spent: 60 minutes  Treatment Type: Individual Therapy  Reported Symptoms: worry, depression, physical pain  Mental Status Exam: Appearance:  Casual  Behavior: normal  Motor: normal  Speech/Language:  normal  Affect: Full Range  Mood: blunted  Thought process: anxious  Thought content:   WNL  Sensory/Perceptual disturbances:   WNL  Orientation: oriented to person, place, time/date, and situation  Attention: Good  Concentration: Fair  Memory: [place] of knowledge:  Good  Insight:   Fair  Judgment:  Poor  Impulse Control: Poor   Risk Assessment: Danger to Self:  No Self-injurious Behavior: No Danger to Others: No Duty to Warn:no Physical Aggression / Violence:No  Access to Firearms a concern: No  Gang Involvement:No   Subjective: The patient attended a face-to-face individual therapy session via video visit on caregility today.  The patient gave verbal consent for the session to be on video on caregility.  The patient was in her car alone and therapist was in the office.  The patient presents with a blunted affect and mood is anxious and depressed.  The patient reports that she has not been feeling well over the last few weeks.  She states that she has been having difficulty managing her sugars and it seems that she is not doing a very good job of taking her medications like she needs to or eating like she needs to.  She reports that she did go to her primary care physician and they are trying to work with her on getting everything regulated.  It seems that she is having some back pain and having to look into health to get that resolved and this may be causing some issues with her blood sugars.  I encouraged her to continue to follow through with her primary care physician about this.  The patient also talked about being frustrated with the  situation with the last lawsuit that she had.  We talked about reframing how she thinks about it as she is going to bring negative energy to her if she continues to think in a negative way.  She said that she feels like she did not win anything.  We talked about her getting a settlement and that just because she got a settlement she won because it was something that she did not have before.  I encouraged her to try to be more positive around so that she can hopefully meet her goal of someday being able to purchase a home.  The patient reports that she feels so much better after she has a conversation and we talked about her continuing to do self-care activities.  Interventions: Cognitive Behavioral Therapy and Mindfulness Meditation  Diagnosis:Major depressive disorder, recurrent episode, moderate (HCC)  Generalized anxiety disorder  PTSD (post-traumatic stress disorder)  Plan: Treatment Plan  Strengths/Abilities:  intelligent, motivated   Treatment Preferences: Outpatient Individual therapy  Statement of Needs:  "I have anxiety and depression"  Symptoms:  Autonomic hyperactivity (e.g., palpitations, shortness of breath, dry mouth, trouble swallowing,  nausea, diarrhea).: (Status: improved). Excessive and/or unrealistic worry that  is difficult to control occurring more days than not for at least 6 months about a number of events or  activities.: (Status: improved). Hypervigilance (e.g., feeling constantly on  edge, experiencing concentration difficulties, having trouble falling or staying asleep, exhibiting a  general state of irritability).: (Status: improved). Motor tension (  e.g.,  restlessness, tiredness, shakiness, muscle tension).: (Status: improved).  Problems Addressed:  Depression and Anxiety  Goals:  LTG:1. Enhance ability to effectively cope with the full variety of life's worries  and anxieties.  50% 2. Learn and implement coping skills that result in a reduction of anxiety   and worry, and improved daily functioning  50% 3. Reduce overall frequency, intensity, and duration of the anxiety so that  daily functioning is not impaired.  50% 4. Resolve the core conflict that is the source of anxiety.  10% 5. Stabilize anxiety level while increasing ability to function on a daily  20% STG: 1. Identify, challenge, and replace biased, fearful self-talk with positive, realistic, and empowering self talk  50% 2. Learn and implement problem-solving strategies for realistically addressing worries.  50%   3.Learn and implement calming skills to reduce overall anxiety and manage anxiety symptoms.  59%   Target Date:  10/13/2022 Frequency:  weekly to biweekly Modality: Individual therapy Interventions by Therapist: CBT, problem solving, EMDR, insight oriented and mindfulness meditation  Patient has approved this treatment plan  Rosalio Catterton G Briseidy Spark, [name]

## 2022-07-12 ENCOUNTER — Ambulatory Visit: Payer: Medicare PPO | Admitting: Psychology

## 2022-07-17 ENCOUNTER — Ambulatory Visit (INDEPENDENT_AMBULATORY_CARE_PROVIDER_SITE_OTHER): Payer: Medicare PPO | Admitting: Psychology

## 2022-07-17 DIAGNOSIS — F331 Major depressive disorder, recurrent, moderate: Secondary | ICD-10-CM | POA: Diagnosis not present

## 2022-07-17 DIAGNOSIS — F411 Generalized anxiety disorder: Secondary | ICD-10-CM

## 2022-07-17 DIAGNOSIS — F4310 Post-traumatic stress disorder, unspecified: Secondary | ICD-10-CM | POA: Diagnosis not present

## 2022-07-17 NOTE — Progress Notes (Signed)
[place] Counselor/Therapist Progress Note  Patient ID: Lisa Cole, MRN: [id number],    Date: 07/17/2022  Time Spent: 60 minutes  Treatment Type: Individual Therapy  Reported Symptoms: worry, depression, physical pain  Mental Status Exam: Appearance:  Casual  Behavior: normal  Motor: normal  Speech/Language:  normal  Affect: Full Range  Mood: blunted  Thought process: anxious  Thought content:   WNL  Sensory/Perceptual disturbances:   WNL  Orientation: oriented to person, place, time/date, and situation  Attention: Good  Concentration: Fair  Memory: [place] of knowledge:  Good  Insight:   Fair  Judgment:  Poor  Impulse Control: Poor   Risk Assessment: Danger to Self:  No Self-injurious Behavior: No Danger to Others: No Duty to Warn:no Physical Aggression / Violence:No  Access to Firearms a concern: No  Gang Involvement:No   Subjective: The patient attended a face-to-face individual therapy session via video visit on caregility today.  The patient gave verbal consent for the session to be on video on caregility.  The patient was in her car alone and therapist was in the office.  The patient presents with a blunted affect and mood is anxious and depressed.  The patient states that she has been having some physical pain and we talked about this for a while.  The patient is delaying getting a colonoscopy and an endoscopy because she is afraid of the sedation.  I explained to her that the sedation now is different than it used to be and that it might be okay for her to consider that.  The patient also talked about a relationship that she is currently involved then and she does not feel like she is getting what she wants from the relationship.  I explained to her that she is either going to have to change her expectations about the relationship or she is going to have to decide to get out of it so that she can get what she wants.  We will continue to  talk about this possibly during the next session as she this situation has gone on for quite some time and she gets very frustrated by it.  Interventions: Cognitive Behavioral Therapy and Mindfulness Meditation  Diagnosis:Major depressive disorder, recurrent episode, moderate (HCC)  Generalized anxiety disorder  PTSD (post-traumatic stress disorder)  Plan: Treatment Plan  Strengths/Abilities:  intelligent, motivated   Treatment Preferences: Outpatient Individual therapy  Statement of Needs:  "I have anxiety and depression"  Symptoms:  Autonomic hyperactivity (e.g., palpitations, shortness of breath, dry mouth, trouble swallowing,  nausea, diarrhea).: (Status: improved). Excessive and/or unrealistic worry that  is difficult to control occurring more days than not for at least 6 months about a number of events or  activities.: (Status: improved). Hypervigilance (e.g., feeling constantly on  edge, experiencing concentration difficulties, having trouble falling or staying asleep, exhibiting a  general state of irritability).: (Status: improved). Motor tension (e.g.,  restlessness, tiredness, shakiness, muscle tension).: (Status: improved).  Problems Addressed:  Depression and Anxiety  Goals:  LTG:1. Enhance ability to effectively cope with the full variety of life's worries  and anxieties.  50% 2. Learn and implement coping skills that result in a reduction of anxiety  and worry, and improved daily functioning  50% 3. Reduce overall frequency, intensity, and duration of the anxiety so that  daily functioning is not impaired.  50% 4. Resolve the core conflict that is the source of anxiety.  10% 5. Stabilize anxiety level while increasing ability  to function on a daily  20% STG: 1. Identify, challenge, and replace biased, fearful self-talk with positive, realistic, and empowering self talk  50% 2. Learn and implement problem-solving strategies for realistically addressing worries.   50%   3.Learn and implement calming skills to reduce overall anxiety and manage anxiety symptoms.  59%   Target Date:  10/13/2022 Frequency:  weekly to biweekly Modality: Individual therapy Interventions by Therapist: CBT, problem solving, EMDR, insight oriented and mindfulness meditation  Patient has approved this treatment plan  Dannell Gortney G Sullivan Jacuinde, [name]

## 2022-07-18 ENCOUNTER — Emergency Department (HOSPITAL_COMMUNITY)
Admission: EM | Admit: 2022-07-18 | Discharge: 2022-07-19 | Discharge status: Against Medical Advice | Payer: Medicare PPO | Attending: Student | Admitting: Student

## 2022-07-18 DIAGNOSIS — F419 Anxiety disorder, unspecified: Secondary | ICD-10-CM | POA: Insufficient documentation

## 2022-07-18 DIAGNOSIS — Z5321 Procedure and treatment not carried out due to patient leaving prior to being seen by health care provider: Secondary | ICD-10-CM | POA: Insufficient documentation

## 2022-07-18 DIAGNOSIS — R21 Rash and other nonspecific skin eruption: Secondary | ICD-10-CM | POA: Insufficient documentation

## 2022-07-18 MED ORDER — FAMOTIDINE 20 MG PO TABS
20.0000 mg | ORAL_TABLET | Freq: Once | ORAL | Status: AC
  Administered 2022-07-18: 20 mg via ORAL
  Filled 2022-07-18: qty 1

## 2022-07-18 MED ORDER — DIPHENHYDRAMINE HCL 25 MG PO CAPS
25.0000 mg | ORAL_CAPSULE | Freq: Once | ORAL | Status: AC
  Administered 2022-07-18: 25 mg via ORAL
  Filled 2022-07-18: qty 1

## 2022-07-18 NOTE — ED Provider Triage Note (Signed)
Emergency Medicine Provider Triage Evaluation Note  Lisa Cole , a 41 y.o. female  was evaluated in triage.  Pt complains of concerns of a allergic reaction endorses a red rash on her hands bilaterally, came on earlier today, states they are itchy, she has had induration on her arms, there is no other rash noted, no tongue throat lip swelling difficulty breathing no GI symptoms no new medications no new foods no new environmental changes never had this past has not taken H1 and H2 blockers or steroids..  Review of Systems  Positive: Itchiness, rash Negative: Nausea, vomiting  Physical Exam  BP (!) 158/95 (BP Location: Right Arm)   Pulse (!) 121   Temp 98.4 F (36.9 C) (Oral)   Resp 17   SpO2 98%  Gen:   Awake, no distress   Resp:  Normal effort  MSK:   Moves extremities without difficulty  Other:  No systemic rash present, no trismus no torticollis no oral edema tongue uvula midline controlling oral secretions tonsils equal symmetric bilaterally no submandibular swelling lungs sound clear, no muffled tone voice.  Medical Decision Making  Medically screening exam initiated at 11:22 PM.  Appropriate orders placed.  Megann J Arceneaux was informed that the remainder of the evaluation will be completed by another provider, this initial triage assessment does not replace that evaluation, and the importance of remaining in the ED until their evaluation is complete.  Given H1H2 blockers will need further reassessment.   Marcello Fennel, PA-C 07/18/22 [id number]

## 2022-07-18 NOTE — ED Notes (Signed)
Pt stated she is going to just go home, she has had enough of tonight.

## 2022-07-18 NOTE — ED Triage Notes (Signed)
Pt via [facility] for eval of potential allergic rx to retuvostatin, linzess before eating dinner, "probably toradol" at [facility] earlier. Endorsed redness & itching to palms, feeling of tongue swelling. Pt noted to be having "anxiety" on EMS arrival. No hives noted, breath sounds equal  168/98 HR110 RR 18 97% RA

## 2022-07-27 ENCOUNTER — Ambulatory Visit: Payer: Medicare PPO | Admitting: Internal Medicine

## 2022-07-27 ENCOUNTER — Ambulatory Visit: Payer: Medicare PPO | Admitting: Psychology

## 2022-08-09 ENCOUNTER — Ambulatory Visit (INDEPENDENT_AMBULATORY_CARE_PROVIDER_SITE_OTHER): Payer: Medicare PPO | Admitting: Psychology

## 2022-08-09 DIAGNOSIS — F331 Major depressive disorder, recurrent, moderate: Secondary | ICD-10-CM

## 2022-08-09 DIAGNOSIS — F4310 Post-traumatic stress disorder, unspecified: Secondary | ICD-10-CM | POA: Diagnosis not present

## 2022-08-09 DIAGNOSIS — F411 Generalized anxiety disorder: Secondary | ICD-10-CM

## 2022-08-10 NOTE — Progress Notes (Signed)
[place] Counselor/Therapist Progress Note  Patient ID: Lisa Cole, MRN: [id number],    Date: 08/09/2022  Time Spent: 60 minutes  Treatment Type: Individual Therapy  Reported Symptoms: worry, depression, physical pain  Mental Status Exam: Appearance:  Casual  Behavior: normal  Motor: normal  Speech/Language:  normal  Affect: Full Range  Mood: blunted  Thought process: pleasant  Thought content:   WNL  Sensory/Perceptual disturbances:   WNL  Orientation: oriented to person, place, time/date, and situation  Attention: Good  Concentration: Fair  Memory: [place] of knowledge:  Good  Insight:   Fair  Judgment:  Poor  Impulse Control: Poor   Risk Assessment: Danger to Self:  No Self-injurious Behavior: No Danger to Others: No Duty to Warn:no Physical Aggression / Violence:No  Access to Firearms a concern: No  Gang Involvement:No   Subjective: The patient attended a face-to-face individual therapy session via video visit on caregility today.  The patient gave verbal consent for the session to be on video on caregility.  The patient was in her  home alone and therapist was in the office.  The patient presents with a blunted affect and mood is pleasant.  The patient reports that she is working on trying to secure a house that she can buy.  She talked about the process that she is going through to do this.  The patient reports that she has been feeling like awaits been lifted off of her now that the one lawsuit is over.  The patient still would like to file a complaint against her lawyer and she has been avoiding this.  We talked about the importance of her going ahead and getting it done so that her time does not run out.  During this session I did bring up that I am looking to retire in 2 years and the patient became very upset.  I told her this so that she could start preparing herself for my leaving and I also told her that I wanted for her to have a goal  of trying to get to the place where she got much better and could handle things on her own.  The patient will be fine handling things and we will continue to process termination as we move through the next 2 years.  Interventions: Cognitive Behavioral Therapy and Mindfulness Meditation  Diagnosis:Major depressive disorder, recurrent episode, moderate (HCC)  Generalized anxiety disorder  PTSD (post-traumatic stress disorder)  Plan: Treatment Plan  Strengths/Abilities:  intelligent, motivated   Treatment Preferences: Outpatient Individual therapy  Statement of Needs:  "I have anxiety and depression"  Symptoms:  Autonomic hyperactivity (e.g., palpitations, shortness of breath, dry mouth, trouble swallowing,  nausea, diarrhea).: (Status: improved). Excessive and/or unrealistic worry that  is difficult to control occurring more days than not for at least 6 months about a number of events or  activities.: (Status: improved). Hypervigilance (e.g., feeling constantly on  edge, experiencing concentration difficulties, having trouble falling or staying asleep, exhibiting a  general state of irritability).: (Status: improved). Motor tension (e.g.,  restlessness, tiredness, shakiness, muscle tension).: (Status: improved).  Problems Addressed:  Depression and Anxiety  Goals:  LTG:1. Enhance ability to effectively cope with the full variety of life's worries  and anxieties.  50% 2. Learn and implement coping skills that result in a reduction of anxiety  and worry, and improved daily functioning  50% 3. Reduce overall frequency, intensity, and duration of the anxiety so that  daily functioning is not  impaired.  50% 4. Resolve the core conflict that is the source of anxiety.  10% 5. Stabilize anxiety level while increasing ability to function on a daily  20% STG: 1. Identify, challenge, and replace biased, fearful self-talk with positive, realistic, and empowering self talk  50% 2. Learn and  implement problem-solving strategies for realistically addressing worries.  50%   3.Learn and implement calming skills to reduce overall anxiety and manage anxiety symptoms.  59%   Target Date:  10/13/2022 Frequency:  weekly to biweekly Modality: Individual therapy Interventions by Therapist: CBT, problem solving, EMDR, insight oriented and mindfulness meditation  Patient has approved this treatment plan  Marshall Roehrich G Pierce Barocio, [name]

## 2022-08-23 ENCOUNTER — Ambulatory Visit (INDEPENDENT_AMBULATORY_CARE_PROVIDER_SITE_OTHER): Payer: Medicare PPO | Admitting: Psychology

## 2022-08-23 DIAGNOSIS — F331 Major depressive disorder, recurrent, moderate: Secondary | ICD-10-CM

## 2022-08-23 DIAGNOSIS — F411 Generalized anxiety disorder: Secondary | ICD-10-CM

## 2022-08-23 DIAGNOSIS — F4310 Post-traumatic stress disorder, unspecified: Secondary | ICD-10-CM

## 2022-08-23 NOTE — Progress Notes (Signed)
[place] Counselor/Therapist Progress Note  Patient ID: Lisa Cole, MRN: [id number],    Date: 08/23/2022  Time Spent: 60 minutes  Treatment Type: Individual Therapy  Reported Symptoms: worry, depression, physical pain  Mental Status Exam: Appearance:  Casual  Behavior: normal  Motor: normal  Speech/Language:  normal  Affect: Full Range  Mood: blunted  Thought process: pleasant  Thought content:   WNL  Sensory/Perceptual disturbances:   WNL  Orientation: oriented to person, place, time/date, and situation  Attention: Good  Concentration: Fair  Memory: [place] of knowledge:  Good  Insight:   Fair  Judgment:  Poor  Impulse Control: Poor   Risk Assessment: Danger to Self:  No Self-injurious Behavior: No Danger to Others: No Duty to Warn:no Physical Aggression / Violence:No  Access to Firearms a concern: No  Gang Involvement:No   Subjective: The patient attended a face-to-face individual therapy session via video visit on caregility today.  The patient gave verbal consent for the session to be on video on caregility.  The patient was in her  home alone and therapist was in the office.  The patient presents with a blunted affect and mood is pleasant.  The patient reports that she is doing okay.  She states that she has been working some and will resting some.  We talked briefly about what she is doing to try to get a house and she states that she may have to wait a while because she is trying to get her credit score up and she wants to make sure she has enough money for a down payment.  The patient also talked briefly about the other lawsuits that she has going on and she does not seem to be very stressed about those at present.  She is going to physical therapy right now and plans to continue to do that to do this.  We also talked today about the relationship that she had with a friend of hers and it seems that she has just ended that and seems to be okay  about it.  We will continue to use problem solving and cognitive behavioral therapy to help patient deal with stressors in her life.  Interventions: Cognitive Behavioral Therapy and Mindfulness Meditation  Diagnosis:Major depressive disorder, recurrent episode, moderate (HCC)  Generalized anxiety disorder  PTSD (post-traumatic stress disorder)  Plan: Treatment Plan  Strengths/Abilities:  intelligent, motivated   Treatment Preferences: Outpatient Individual therapy  Statement of Needs:  "I have anxiety and depression"  Symptoms:  Autonomic hyperactivity (e.g., palpitations, shortness of breath, dry mouth, trouble swallowing,  nausea, diarrhea).: (Status: improved). Excessive and/or unrealistic worry that  is difficult to control occurring more days than not for at least 6 months about a number of events or  activities.: (Status: improved). Hypervigilance (e.g., feeling constantly on  edge, experiencing concentration difficulties, having trouble falling or staying asleep, exhibiting a  general state of irritability).: (Status: improved). Motor tension (e.g.,  restlessness, tiredness, shakiness, muscle tension).: (Status: improved).  Problems Addressed:  Depression and Anxiety  Goals:  LTG:1. Enhance ability to effectively cope with the full variety of life's worries  and anxieties.  50% 2. Learn and implement coping skills that result in a reduction of anxiety  and worry, and improved daily functioning  50% 3. Reduce overall frequency, intensity, and duration of the anxiety so that  daily functioning is not impaired.  50% 4. Resolve the core conflict that is the source of anxiety.  10% 5. Stabilize  anxiety level while increasing ability to function on a daily  20% STG: 1. Identify, challenge, and replace biased, fearful self-talk with positive, realistic, and empowering self talk  50% 2. Learn and implement problem-solving strategies for realistically addressing worries.  50%    3.Learn and implement calming skills to reduce overall anxiety and manage anxiety symptoms.  59%   Target Date:  10/13/2022 Frequency:  weekly to biweekly Modality: Individual therapy Interventions by Therapist: CBT, problem solving, EMDR, insight oriented and mindfulness meditation  Patient has approved this treatment plan  Sacoya Mcgourty G Efrain Clauson, [name]

## 2022-09-05 ENCOUNTER — Ambulatory Visit (INDEPENDENT_AMBULATORY_CARE_PROVIDER_SITE_OTHER): Payer: Medicare PPO | Admitting: Psychology

## 2022-09-05 DIAGNOSIS — F411 Generalized anxiety disorder: Secondary | ICD-10-CM

## 2022-09-05 DIAGNOSIS — F4310 Post-traumatic stress disorder, unspecified: Secondary | ICD-10-CM

## 2022-09-05 DIAGNOSIS — F331 Major depressive disorder, recurrent, moderate: Secondary | ICD-10-CM | POA: Diagnosis not present

## 2022-09-06 NOTE — Progress Notes (Signed)
[place] Counselor/Therapist Progress Note  Patient ID: Lisa Cole, MRN: [id number],    Date: 09/05/2022  Time Spent: 60 minutes  Treatment Type: Individual Therapy  Reported Symptoms: worry, depression, physical pain  Mental Status Exam: Appearance:  Casual  Behavior: normal  Motor: normal  Speech/Language:  normal  Affect: Full Range  Mood: blunted  Thought process: pleasant  Thought content:   WNL  Sensory/Perceptual disturbances:   WNL  Orientation: oriented to person, place, time/date, and situation  Attention: Good  Concentration: Fair  Memory: [place] of knowledge:  Good  Insight:   Fair  Judgment:  Poor  Impulse Control: Poor   Risk Assessment: Danger to Self:  No Self-injurious Behavior: No Danger to Others: No Duty to Warn:no Physical Aggression / Violence:No  Access to Firearms a concern: No  Gang Involvement:No   Subjective: The patient attended a face-to-face individual therapy session via video visit on caregility today.  The patient gave verbal consent for the session to be on video on caregility.  The patient was in her  home alone and therapist was in the office.  The patient presents is pleasant and cooperative.  The patient appears to be doing better today than she has previously.  She states that she has been taking care of herself and trying to cope with what has been happening in her life.  Currently she does not have a lot of stressors that are causing her to be reactive.  We talked today about her relationship issues with the guy that she was seeing.  She has decided not to see him at present because he does not give her what she needs emotionally.  She states that he says the right thing but then his behavior is not congruent.  We talked about her continuing to take care of herself and talked about setting limits with the situation. Interventions: Cognitive Behavioral Therapy and Mindfulness Meditation  Diagnosis:Major  depressive disorder, recurrent episode, moderate (HCC)  Generalized anxiety disorder  PTSD (post-traumatic stress disorder)  Plan: Treatment Plan  Strengths/Abilities:  intelligent, motivated   Treatment Preferences: Outpatient Individual therapy  Statement of Needs:  "I have anxiety and depression"  Symptoms:  Autonomic hyperactivity (e.g., palpitations, shortness of breath, dry mouth, trouble swallowing,  nausea, diarrhea).: (Status: improved). Excessive and/or unrealistic worry that  is difficult to control occurring more days than not for at least 6 months about a number of events or  activities.: (Status: improved). Hypervigilance (e.g., feeling constantly on  edge, experiencing concentration difficulties, having trouble falling or staying asleep, exhibiting a  general state of irritability).: (Status: improved). Motor tension (e.g.,  restlessness, tiredness, shakiness, muscle tension).: (Status: improved).  Problems Addressed:  Depression and Anxiety  Goals:  LTG:1. Enhance ability to effectively cope with the full variety of life's worries  and anxieties.  50% 2. Learn and implement coping skills that result in a reduction of anxiety  and worry, and improved daily functioning  50% 3. Reduce overall frequency, intensity, and duration of the anxiety so that  daily functioning is not impaired.  50% 4. Resolve the core conflict that is the source of anxiety.  10% 5. Stabilize anxiety level while increasing ability to function on a daily  20% STG: 1. Identify, challenge, and replace biased, fearful self-talk with positive, realistic, and empowering self talk  50% 2. Learn and implement problem-solving strategies for realistically addressing worries.  50%   3.Learn and implement calming skills to reduce overall anxiety and  manage anxiety symptoms.  59%   Target Date:  10/13/2022 Frequency:  weekly to biweekly Modality: Individual therapy Interventions by Therapist: CBT,  problem solving, EMDR, insight oriented and mindfulness meditation  Patient has approved this treatment plan  Shantaya Bluestone G Francisca Harbuck, [name]

## 2022-09-13 ENCOUNTER — Ambulatory Visit: Payer: Medicare PPO | Admitting: Psychology

## 2022-09-20 ENCOUNTER — Ambulatory Visit: Payer: Medicare PPO | Admitting: Psychology

## 2022-09-27 ENCOUNTER — Ambulatory Visit: Payer: Medicare PPO | Admitting: Internal Medicine

## 2022-10-22 ENCOUNTER — Encounter: Payer: Self-pay | Admitting: Internal Medicine

## 2022-10-24 ENCOUNTER — Other Ambulatory Visit: Payer: Self-pay | Admitting: Internal Medicine

## 2022-10-24 ENCOUNTER — Ambulatory Visit (INDEPENDENT_AMBULATORY_CARE_PROVIDER_SITE_OTHER): Payer: Medicare PPO | Admitting: Psychology

## 2022-10-24 DIAGNOSIS — F411 Generalized anxiety disorder: Secondary | ICD-10-CM | POA: Diagnosis not present

## 2022-10-24 DIAGNOSIS — F331 Major depressive disorder, recurrent, moderate: Secondary | ICD-10-CM

## 2022-10-24 DIAGNOSIS — F4310 Post-traumatic stress disorder, unspecified: Secondary | ICD-10-CM

## 2022-10-24 MED ORDER — DEXCOM G7 SENSOR MISC: 3.0000 | 4 refills | Status: DC

## 2022-10-24 MED ORDER — DEXCOM G7 RECEIVER DEVI: 1.0000 | Freq: Once | 0 refills | Status: AC

## 2022-10-24 NOTE — Progress Notes (Signed)
[facility] Counselor/Therapist Progress Note  Patient ID: Lisa Cole, MRN: [id number],    Date: 10/24/2022  Time Spent: 60 minutes  Treatment Type: Individual Therapy  Reported Symptoms: worry, depression, physical pain  Mental Status Exam: Appearance:  Casual  Behavior: normal  Motor: normal  Speech/Language:  normal  Affect: Full Range  Mood: blunted  Thought process: pleasant  Thought content:   WNL  Sensory/Perceptual disturbances:   WNL  Orientation: oriented to person, place, time/date, and situation  Attention: Good  Concentration: Fair  Memory: [place] of knowledge:  Good  Insight:   Fair  Judgment:  Poor  Impulse Control: Poor   Risk Assessment: Danger to Self:  No Self-injurious Behavior: No Danger to Others: No Duty to Warn:no Physical Aggression / Violence:No  Access to Firearms a concern: No  Gang Involvement:No   Subjective: The patient attended a face-to-face individual therapy session via video visit on caregility today.  The patient gave verbal consent for the session to be on video on caregility.  The patient was in her  home alone and therapist was in the office.  The patient presents with a blunted affect and mood is pleasant.  The patient reports that she has not been feeling well lately and has been having difficulty with her blood sugars.  The patient talked about having to deal with the previous attorney that she felt like took the money he promised her that he would not take.  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  She talked about feeling proud of herself because she has not reverted back to the previous relationship that she was in and that was dysfunctional.  We talked about her continuing to manage her reactions and she seems to be doing a better job with that.  We will continue to work with the patient on learning new coping strategies on dealing with her anxiety and situations  that arise.  Interventions: Cognitive Behavioral Therapy and Mindfulness Meditation  Diagnosis:Major depressive disorder, recurrent episode, moderate (HCC)  Generalized anxiety disorder  PTSD (post-traumatic stress disorder)  Plan: Treatment Plan   Strengths/Abilities:  intelligent, motivated   Treatment Preferences: Outpatient Individual therapy  Statement of Needs:  "I have anxiety and depression"  Symptoms:  Autonomic hyperactivity (e.g., palpitations, shortness of breath, dry mouth, trouble swallowing,  nausea, diarrhea).: (Status: improved). Excessive and/or unrealistic worry that  is difficult to control occurring more days than not for at least 6 months about a number of events or  activities.: (Status: improved). Hypervigilance (e.g., feeling constantly on  edge, experiencing concentration difficulties, having trouble falling or staying asleep, exhibiting a  general state of irritability).: (Status: improved). Motor tension (e.g.,  restlessness, tiredness, shakiness, muscle tension).: (Status: improved).  Problems Addressed:  Depression and Anxiety  Goals:  LTG:1. Enhance ability to effectively cope with the full variety of life's worries  and anxieties.  50% 2. Learn and implement coping skills that result in a reduction of anxiety  and worry, and improved daily functioning  50% 3. Reduce overall frequency, intensity, and duration of the anxiety so that  daily functioning is not impaired.  50% 4. Resolve the core conflict that is the source of anxiety.  10% 5. Stabilize anxiety level while increasing ability to function on a daily  20% STG: 1. Identify, challenge, and replace biased, fearful self-talk with positive, realistic, and empowering self talk  50% 2. Learn and implement problem-solving strategies  for realistically addressing worries.  50%   3.Learn and implement calming skills to reduce overall anxiety and manage anxiety symptoms.  59%   Target Date:   10/14/2023 Frequency:  weekly to biweekly Modality: Individual therapy Interventions by Therapist: CBT, problem solving, EMDR, insight oriented and mindfulness meditation  Patient has approved this treatment plan  Marvyn Torrez G Peachie Barkalow, [name]

## 2022-11-08 ENCOUNTER — Ambulatory Visit (INDEPENDENT_AMBULATORY_CARE_PROVIDER_SITE_OTHER): Payer: Medicare PPO | Admitting: Psychology

## 2022-11-08 DIAGNOSIS — F4310 Post-traumatic stress disorder, unspecified: Secondary | ICD-10-CM | POA: Diagnosis not present

## 2022-11-08 DIAGNOSIS — F331 Major depressive disorder, recurrent, moderate: Secondary | ICD-10-CM

## 2022-11-08 DIAGNOSIS — F411 Generalized anxiety disorder: Secondary | ICD-10-CM | POA: Diagnosis not present

## 2022-11-08 NOTE — Progress Notes (Signed)
[facility] Counselor/Therapist Progress Note  Patient ID: Lisa Cole, MRN: [id number],    Date: 11/08/2022  Time Spent: 30 minutes  Treatment Type: Individual Therapy  Reported Symptoms: worry, depression, physical pain  Mental Status Exam: Appearance:  Casual  Behavior: normal  Motor: normal  Speech/Language:  normal  Affect: Full Range  Mood: blunted  Thought process: pleasant  Thought content:   WNL  Sensory/Perceptual disturbances:   WNL  Orientation: oriented to person, place, time/date, and situation  Attention: Good  Concentration: Fair  Memory: [place] of knowledge:  Good  Insight:   Fair  Judgment:  Poor  Impulse Control: Poor   Risk Assessment: Danger to Self:  No Self-injurious Behavior: No Danger to Others: No Duty to Warn:no Physical Aggression / Violence:No  Access to Firearms a concern: No  Gang Involvement:No   Subjective: The patient attended a face-to-face individual therapy session via video visit on caregility today.  The patient gave verbal consent for the session to be on video on caregility.  The patient was in her car alone and therapist was in the office.  The patient presents with a blunted affect and mood is pleasant.  We were only able to do a 30-minute session today because the patient had another appointment scheduled at 1:30.  The patient states that she has been handling things okay but she does need to talk.  She states that she has been having some difficulty getting the house and this has been stressful.  She reports that she has not been involved in unhealthy relationships and has not gotten back involved with the nurse.  We did not get to talk a great deal about anything in particular due to the lack of time.  She is scheduled for me in a few weeks and I told her that I would try to figure and if I had a cancellation.  Interventions: Cognitive Behavioral Therapy and Mindfulness Meditation  Diagnosis:Major  depressive disorder, recurrent episode, moderate (HCC)  Generalized anxiety disorder  PTSD (post-traumatic stress disorder)  Plan: Treatment Plan   Strengths/Abilities:  intelligent, motivated   Treatment Preferences: Outpatient Individual therapy  Statement of Needs:  "I have anxiety and depression"  Symptoms:  Autonomic hyperactivity (e.g., palpitations, shortness of breath, dry mouth, trouble swallowing,  nausea, diarrhea).: (Status: improved). Excessive and/or unrealistic worry that  is difficult to control occurring more days than not for at least 6 months about a number of events or  activities.: (Status: improved). Hypervigilance (e.g., feeling constantly on  edge, experiencing concentration difficulties, having trouble falling or staying asleep, exhibiting a  general state of irritability).: (Status: improved). Motor tension (e.g.,  restlessness, tiredness, shakiness, muscle tension).: (Status: improved).  Problems Addressed:  Depression and Anxiety  Goals:  LTG:1. Enhance ability to effectively cope with the full variety of life's worries  and anxieties.  50% 2. Learn and implement coping skills that result in a reduction of anxiety  and worry, and improved daily functioning  50% 3. Reduce overall frequency, intensity, and duration of the anxiety so that  daily functioning is not impaired.  50% 4. Resolve the core conflict that is the source of anxiety.  10% 5. Stabilize anxiety level while increasing ability to function on a daily  20% STG: 1. Identify, challenge, and replace biased, fearful self-talk with positive, realistic, and empowering self talk  50% 2. Learn and implement problem-solving strategies for realistically addressing worries.  50%   3.Learn and implement calming skills to reduce  overall anxiety and manage anxiety symptoms.  59%   Target Date:  10/14/2023 Frequency:  weekly to biweekly Modality: Individual therapy Interventions by Therapist: CBT,  problem solving, EMDR, insight oriented and mindfulness meditation  Patient has approved this treatment plan  Toussaint Golson G Davis Vannatter, [name]

## 2022-11-09 ENCOUNTER — Encounter: Payer: Self-pay | Admitting: Internal Medicine

## 2022-11-09 ENCOUNTER — Other Ambulatory Visit: Payer: Self-pay | Admitting: Internal Medicine

## 2022-11-09 ENCOUNTER — Ambulatory Visit (INDEPENDENT_AMBULATORY_CARE_PROVIDER_SITE_OTHER): Payer: Medicare PPO | Admitting: Internal Medicine

## 2022-11-09 VITALS — BP 140/100 | HR 78 | Ht <= 58 in | Wt 165.4 lb

## 2022-11-09 DIAGNOSIS — E038 Other specified hypothyroidism: Secondary | ICD-10-CM | POA: Diagnosis not present

## 2022-11-09 DIAGNOSIS — Z794 Long term (current) use of insulin: Secondary | ICD-10-CM | POA: Diagnosis not present

## 2022-11-09 DIAGNOSIS — E559 Vitamin D deficiency, unspecified: Secondary | ICD-10-CM

## 2022-11-09 DIAGNOSIS — E063 Autoimmune thyroiditis: Secondary | ICD-10-CM

## 2022-11-09 DIAGNOSIS — E1165 Type 2 diabetes mellitus with hyperglycemia: Secondary | ICD-10-CM

## 2022-11-09 DIAGNOSIS — Z8639 Personal history of other endocrine, nutritional and metabolic disease: Secondary | ICD-10-CM

## 2022-11-09 LAB — POCT GLYCOSYLATED HEMOGLOBIN (HGB A1C): Hemoglobin A1C: 10 % — AB (ref 4.0–5.6)

## 2022-11-09 MED ORDER — METFORMIN HCL ER 500 MG PO TB24: 500.0000 mg | ORAL_TABLET | Freq: Every day | ORAL | 3 refills | Status: DC
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
## 2022-11-09 NOTE — Patient Instructions (Addendum)
Please restart: - Metformin ER 500 with dinner  Increase: - Humulin N 20 units 2x a day  Decrease: - Humalog 20-25 units 3x a day 15 min before meals or sweet drinks  Please start: - Ozempic 0.5 mg weekly  Please return in 3-4 months.

## 2022-11-23 ENCOUNTER — Ambulatory Visit (INDEPENDENT_AMBULATORY_CARE_PROVIDER_SITE_OTHER): Payer: Medicare PPO | Admitting: Psychology

## 2022-11-23 DIAGNOSIS — F411 Generalized anxiety disorder: Secondary | ICD-10-CM

## 2022-11-23 DIAGNOSIS — F4310 Post-traumatic stress disorder, unspecified: Secondary | ICD-10-CM

## 2022-11-23 DIAGNOSIS — F331 Major depressive disorder, recurrent, moderate: Secondary | ICD-10-CM
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
## 2022-11-28 ENCOUNTER — Ambulatory Visit (INDEPENDENT_AMBULATORY_CARE_PROVIDER_SITE_OTHER): Payer: Medicare PPO | Admitting: Psychology

## 2022-11-28 DIAGNOSIS — F411 Generalized anxiety disorder: Secondary | ICD-10-CM | POA: Diagnosis not present

## 2022-11-28 DIAGNOSIS — F331 Major depressive disorder, recurrent, moderate: Secondary | ICD-10-CM | POA: Diagnosis not present

## 2022-11-28 DIAGNOSIS — F4310 Post-traumatic stress disorder, unspecified: Secondary | ICD-10-CM | POA: Diagnosis not present

## 2022-11-28 NOTE — Progress Notes (Signed)
                Layal Javid G Jaison Petraglia, [name]

## 2022-12-07 ENCOUNTER — Ambulatory Visit (INDEPENDENT_AMBULATORY_CARE_PROVIDER_SITE_OTHER): Payer: Medicare PPO | Admitting: Psychology

## 2022-12-07 DIAGNOSIS — F331 Major depressive disorder, recurrent, moderate: Secondary | ICD-10-CM

## 2022-12-07 DIAGNOSIS — F4310 Post-traumatic stress disorder, unspecified: Secondary | ICD-10-CM

## 2022-12-07 DIAGNOSIS — F411 Generalized anxiety disorder: Secondary | ICD-10-CM

## 2022-12-07 NOTE — Progress Notes (Signed)
                Lisa Cole G Lisa Rhett, [name] 

## 2022-12-18 ENCOUNTER — Ambulatory Visit (INDEPENDENT_AMBULATORY_CARE_PROVIDER_SITE_OTHER): Payer: Medicare PPO | Admitting: Psychology

## 2022-12-18 DIAGNOSIS — F331 Major depressive disorder, recurrent, moderate: Secondary | ICD-10-CM | POA: Diagnosis not present

## 2022-12-18 DIAGNOSIS — F4310 Post-traumatic stress disorder, unspecified: Secondary | ICD-10-CM | POA: Diagnosis not present

## 2022-12-18 DIAGNOSIS — F411 Generalized anxiety disorder: Secondary | ICD-10-CM | POA: Diagnosis not present

## 2022-12-18 NOTE — Addendum Note (Signed)
Addended by: Johny Blamer on: 12/18/2022 03:35 PM   Modules accepted: Level of Service

## 2022-12-18 NOTE — Progress Notes (Signed)
[place] Counselor/Therapist Progress Notes  Patient ID: Lisa Cole, MRN: [id number],    Date: 12/18/2022  Time Spent: 60 minutes  Treatment Type: Individual Therapy  Reported Symptoms: worry, depression, physical pain  Mental Status Exam: Appearance:  Casual  Behavior: normal  Motor: normal  Speech/Language:  normal  Affect: Full Range  Mood: blunted  Thought process: pleasant  Thought content:   WNL  Sensory/Perceptual disturbances:   WNL  Orientation: oriented to person, place, time/date, and situation  Attention: Good  Concentration: Fair  Memory: [place] of knowledge:  Good  Insight:   Fair  Judgment:  Poor  Impulse Control: Poor   Risk Assessment: Danger to Self:  No Self-injurious Behavior: No Danger to Others: No Duty to Warn:no Physical Aggression / Violence:No  Access to Firearms a concern: No  Gang Involvement:No   Subjective: The patient attended a face-to-face individual therapy session via video visit on caregility today.  The patient gave verbal consent for the session to be on video on caregility.  The patient was in her home alone and therapist was in the office.  The patient presents with a blunted affect and mood is pleasant.  The patient reports that her son is going back to college and things seem to be settling down somewhat now.  We talked about some of the things that have been happening since I saw her last.  She reports that she had 2 incidents that caused her some fight or flight responses.  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  We talked about her reaction to things and it seems that she is used to running towards chaos and crisis and we talked about that not being a normal response but a reactionary response.  We discussed the possibility of doing EMDR to help her with some desensitization to this kind of behavior.  The patient is doing better at recognizing when she needs to  remain more stable and we will do some work around her not being as reactionary to crisis.  I did explain to her that some of this response is normal.  We will continue to do cognitive behavioral therapy as well as EMDR, but I would like to see her in person for the EMDR. Interventions: Cognitive Behavioral Therapy and Mindfulness Meditation  Diagnosis:No diagnosis found.  Plan: Treatment Plan   Strengths/Abilities:  intelligent, motivated   Treatment Preferences: Outpatient Individual therapy  Statement of Needs:  "I have anxiety and depression"  Symptoms:  Autonomic hyperactivity (e.g., palpitations, shortness of breath, dry mouth, trouble swallowing,  nausea, diarrhea).: (Status: improved). Excessive and/or unrealistic worry that  is difficult to control occurring more days than not for at least 6 months about a number of events or  activities.: (Status: improved). Hypervigilance (e.g., feeling constantly on  edge, experiencing concentration difficulties, having trouble falling or staying asleep, exhibiting a  general state of irritability).: (Status: improved). Motor tension (e.g.,  restlessness, tiredness, shakiness, muscle tension).: (Status: improved).  Problems Addressed:  Depression and Anxiety  Goals:  LTG:1. Enhance ability to effectively cope with the full variety of life's worries  and anxieties.  50% 2. Learn and implement coping skills that result in a reduction of anxiety  and worry, and improved daily functioning  50% 3. Reduce overall frequency, intensity, and duration of the anxiety so that  daily functioning is not impaired.  50% 4. Resolve the core conflict  that is the source of anxiety.  10% 5. Stabilize anxiety level while increasing ability to function on a daily  20% STG: 1. Identify, challenge, and replace biased, fearful self-talk with positive, realistic, and empowering self talk  50% 2. Learn and implement problem-solving strategies for realistically  addressing worries.  50%   3.Learn and implement calming skills to reduce overall anxiety and manage anxiety symptoms.  59%   Target Date:  10/14/2023 Frequency:  weekly to biweekly Modality: Individual therapy Interventions by Therapist: CBT, problem solving, EMDR, insight oriented and mindfulness meditation  Patient has approved this treatment plan  Havyn Ramo G Errol Ala, [name]

## 2022-12-27 ENCOUNTER — Ambulatory Visit (INDEPENDENT_AMBULATORY_CARE_PROVIDER_SITE_OTHER): Payer: Medicare PPO | Admitting: Psychology

## 2022-12-27 DIAGNOSIS — F331 Major depressive disorder, recurrent, moderate: Secondary | ICD-10-CM | POA: Diagnosis not present

## 2022-12-27 DIAGNOSIS — F411 Generalized anxiety disorder: Secondary | ICD-10-CM

## 2022-12-27 DIAGNOSIS — F4310 Post-traumatic stress disorder, unspecified: Secondary | ICD-10-CM

## 2022-12-27 NOTE — Progress Notes (Signed)
[place] Counselor/Therapist Progress Notes  Patient ID: Lisa Cole, MRN: [id number],    Date: 12/27/2022  Time Spent: 60 minutes  Treatment Type: Individual Therapy  Reported Symptoms: worry, depression, physical pain  Mental Status Exam: Appearance:  Casual  Behavior: normal  Motor: normal  Speech/Language:  normal  Affect: Full Range  Mood: blunted  Thought process: pleasant  Thought content:   WNL  Sensory/Perceptual disturbances:   WNL  Orientation: oriented to person, place, time/date, and situation  Attention: Good  Concentration: Fair  Memory: [place] of knowledge:  Good  Insight:   Fair  Judgment:  Poor  Impulse Control: Poor   Risk Assessment: Danger to Self:  No Self-injurious Behavior: No Danger to Others: No Duty to Warn:no Physical Aggression / Violence:No  Access to Firearms a concern: No  Gang Involvement:No   Subjective: The patient attended a face-to-face individual therapy session via video visit on caregility today.  The patient gave verbal consent for the session to be on video on caregility.  The patient was in her home alone and therapist was in the office.  The patient presents with a blunted affect and mood is pleasant.   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  She feels good about that and is very proud of her accomplishments.  She seems to be doing better and does not seem to be quite as reactive as she has in the past.  We talked today about her having difficulty at times with her health because she is so passionate about what she does and she makes herself tired.  I encouraged her to continue to take care of herself and we talked about what she needs to do and be having the insight to stop when she needs to stop.  We talked about possibly doing EMDR at some point moving forward.  She still has some triggers that apparently got activated this weekend and one of them is about someone lying to  her.  We talked about the need for [place] to do the EMDR and that she would need to come in for regularly scheduled sessions.  We need to look at our schedule and see when she wants to try to start that process moving forward.  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  Interventions: Cognitive Behavioral Therapy and Mindfulness Meditation  Diagnosis:Major depressive disorder, recurrent episode, moderate  Generalized anxiety disorder  PTSD (post-traumatic stress disorder)  Plan: Treatment Plan   Strengths/Abilities:  intelligent, motivated   Treatment Preferences: Outpatient Individual therapy  Statement of Needs:  "I have anxiety and depression"  Symptoms:  Autonomic hyperactivity (e.g., palpitations, shortness of breath, dry mouth, trouble swallowing,  nausea, diarrhea).: (Status: improved). Excessive and/or unrealistic worry that  is difficult to control occurring more days than not for at least 6 months about a number of events or  activities.: (Status: improved). Hypervigilance (e.g., feeling constantly on  edge, experiencing concentration difficulties, having trouble falling or staying asleep, exhibiting a  general state of irritability).: (Status: improved). Motor tension (e.g.,  restlessness, tiredness, shakiness, muscle tension).: (Status: improved).  Problems Addressed:  Depression and Anxiety  Goals:  LTG:1. Enhance ability to effectively cope with the full variety of life's worries  and anxieties.  50% 2. Learn and implement coping skills that result in a reduction of anxiety  and worry, and improved daily functioning  50%  3. Reduce overall frequency, intensity, and duration of the anxiety so that  daily functioning is not impaired.  50% 4. Resolve the core conflict that is the source of anxiety.  10% 5. Stabilize anxiety level while increasing ability to function on a daily  20% STG: 1. Identify, challenge, and  replace biased, fearful self-talk with positive, realistic, and empowering self talk  50% 2. Learn and implement problem-solving strategies for realistically addressing worries.  50%   3.Learn and implement calming skills to reduce overall anxiety and manage anxiety symptoms.  59%   Target Date:  10/14/2023 Frequency:  weekly to biweekly Modality: Individual therapy Interventions by Therapist: CBT, problem solving, EMDR, insight oriented and mindfulness meditation  Patient has approved this treatment plan  Fanta Wimberley G Fiza Nation, [name]                                                                                                      Kary Colaizzi G Chett Taniguchi, [name]

## 2022-12-31 IMAGING — MG MM DIGITAL SCREENING BILAT W/ TOMO AND CAD
6 of 10 series · 6 of 30 positions shown · non-contrast
Comparison: Previous exam(s).

CLINICAL DATA: Screening.

EXAM:
DIGITAL SCREENING BILATERAL MAMMOGRAM WITH TOMOSYNTHESIS AND CAD
TECHNIQUE: Bilateral screening digital craniocaudal and mediolateral oblique
mammograms were obtained. Bilateral screening digital breast
tomosynthesis was performed. The images were evaluated with
computer-aided detection.

[L MLO synth-2D]
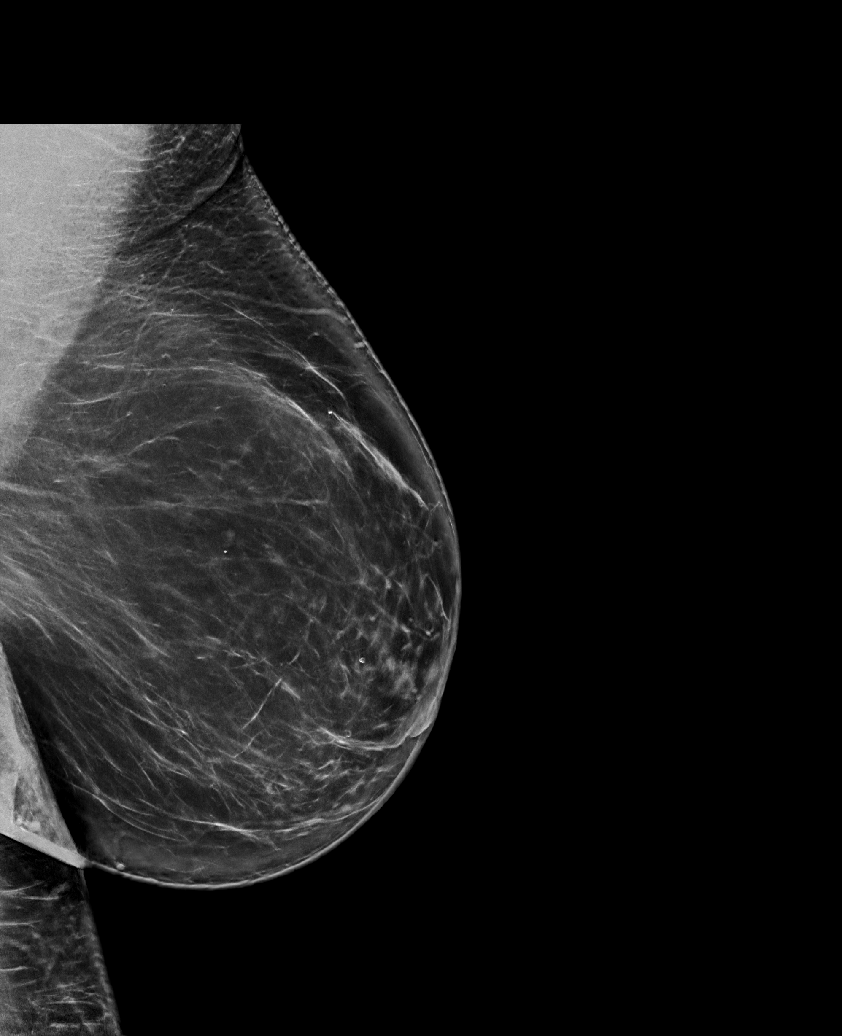

[L CC synth-2D]
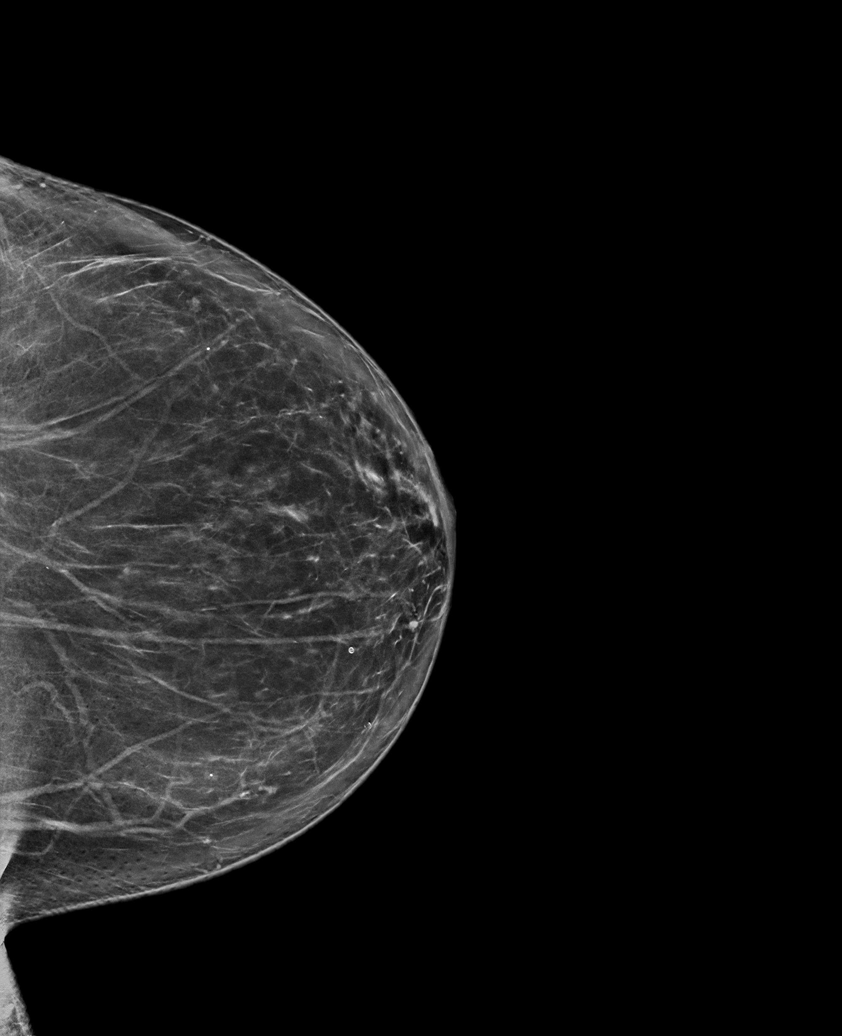

[R MLO synth-2D (1 of 2)]
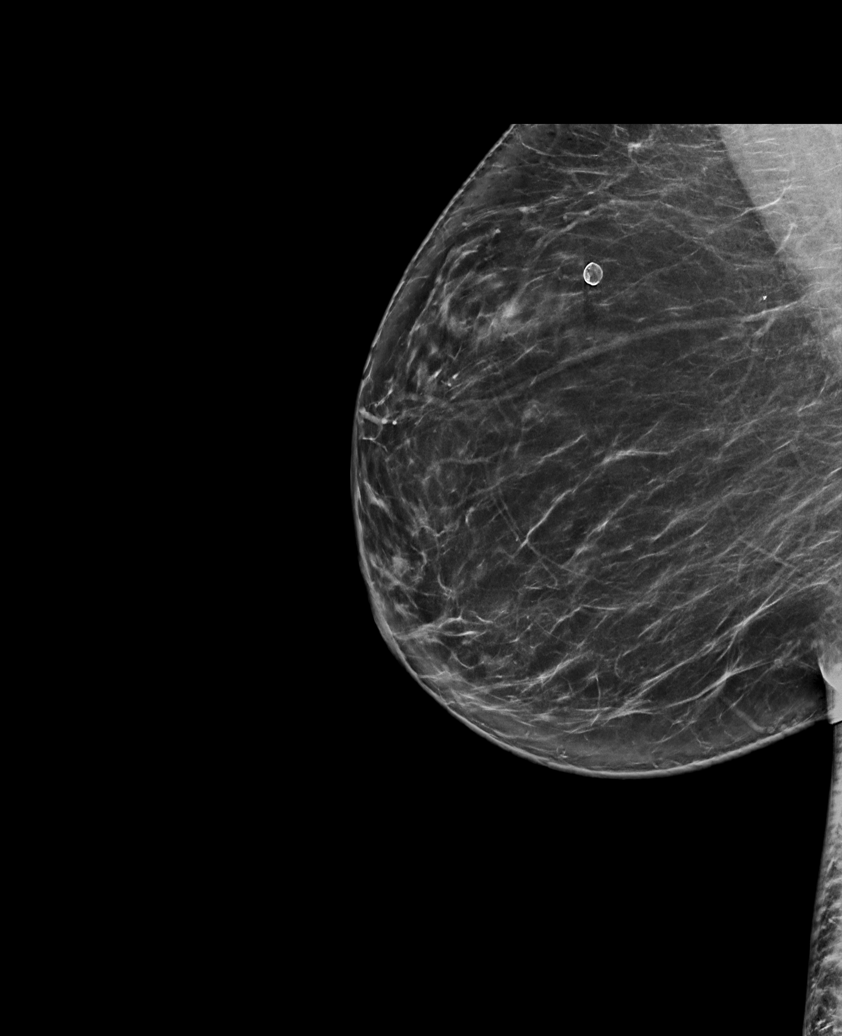

[R CC synth-2D]
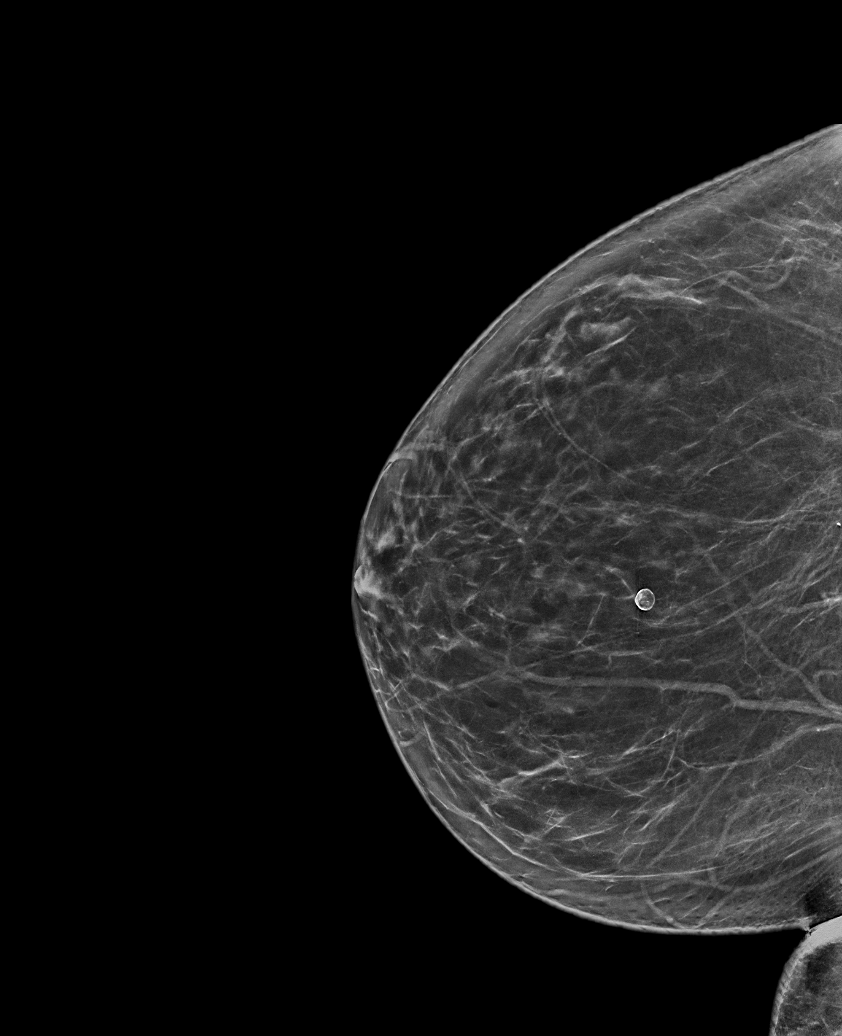

[R MLO synth-2D (2 of 2)]
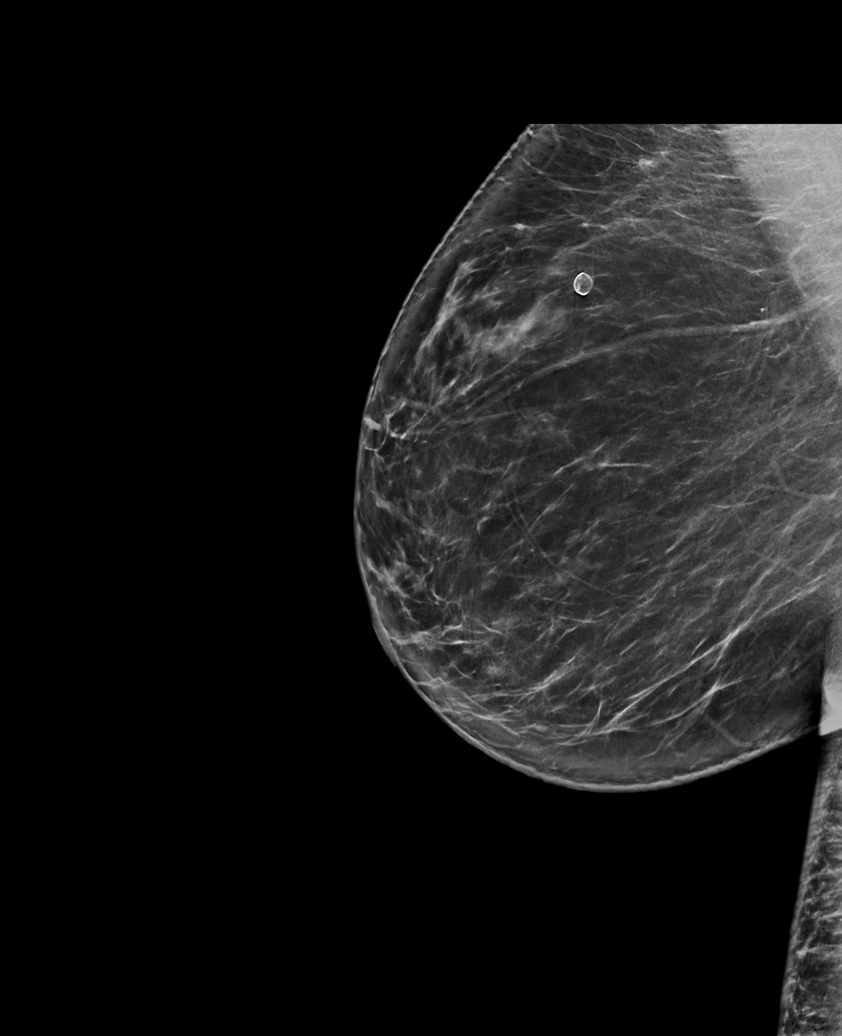

[R MLO tomo · tomo slice 45/89.0]
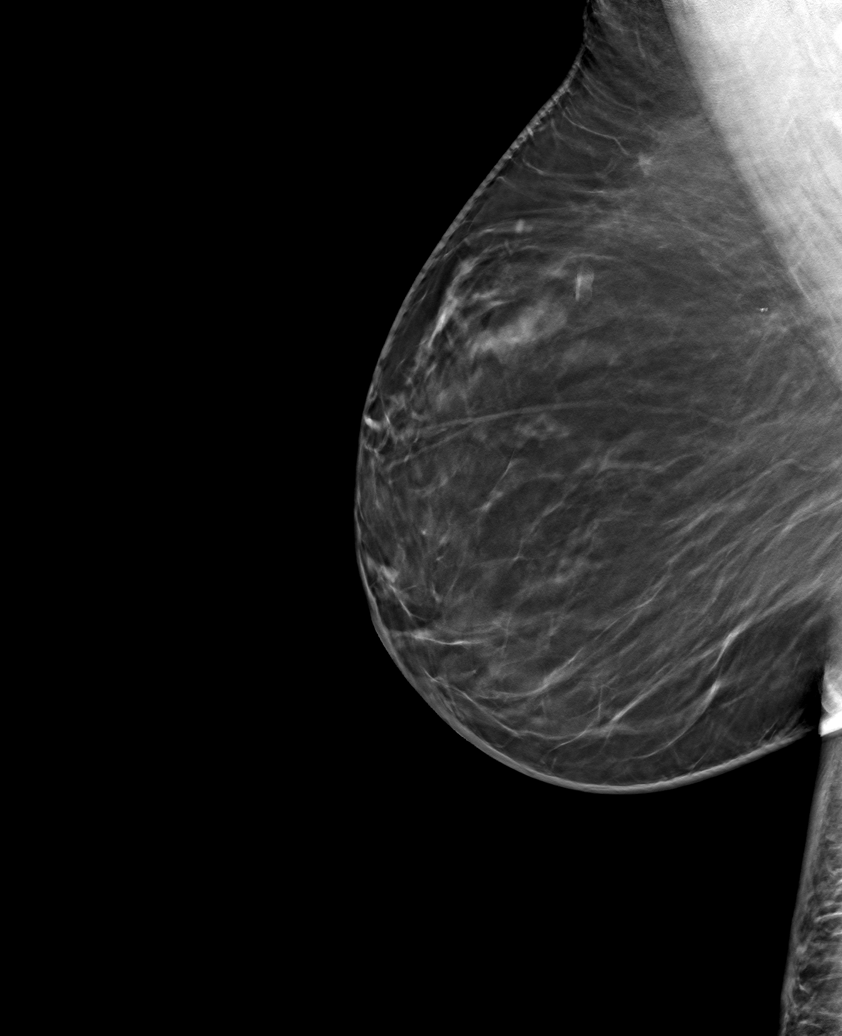

[6 of 30 positions shown; findings below may reference images not displayed]

ACR Breast Density Category b: There are scattered areas of
fibroglandular density.
FINDINGS: There are no findings suspicious for malignancy.
IMPRESSION: No mammographic evidence of malignancy. A result letter of this
screening mammogram will be mailed directly to the patient.

RECOMMENDATION:
Screening mammogram in one year. (Code:51-O-LD2)

BI-RADS CATEGORY  1: Negative.

## 2023-01-02 ENCOUNTER — Ambulatory Visit (INDEPENDENT_AMBULATORY_CARE_PROVIDER_SITE_OTHER): Payer: Medicare PPO | Admitting: Psychology

## 2023-01-02 DIAGNOSIS — F411 Generalized anxiety disorder: Secondary | ICD-10-CM

## 2023-01-02 DIAGNOSIS — F4310 Post-traumatic stress disorder, unspecified: Secondary | ICD-10-CM | POA: Diagnosis not present

## 2023-01-02 DIAGNOSIS — F331 Major depressive disorder, recurrent, moderate: Secondary | ICD-10-CM

## 2023-01-02 NOTE — Progress Notes (Signed)
[facility] Counselor/Therapist Progress Notes  Patient ID: Lisa Cole, MRN: [id number],    Date: 01/02/2023  Time Spent: 60 minutes  Treatment Type: Individual Therapy  Reported Symptoms: worry, depression, physical pain  Mental Status Exam: Appearance:  Casual  Behavior: normal  Motor: normal  Speech/Language:  normal  Affect: Full Range  Mood: normal  Thought process: pleasant  Thought content:   WNL  Sensory/Perceptual disturbances:   WNL  Orientation: oriented to person, place, time/date, and situation  Attention: Good  Concentration: Fair  Memory: WNL  Fund of knowledge:  Good  Insight:   Fair  Judgment:  Poor  Impulse Control: Poor   Risk Assessment: Danger to Self:  No Self-injurious Behavior: No Danger to Others: No Duty to Warn:no Physical Aggression / Violence:No  Access to Firearms a concern: No  Gang Involvement:No   Subjective: The patient attended a face-to-face individual therapy session via video visit on caregility today.  The patient gave verbal consent for the session to be on video on caregility.  The patient was in her home alone and therapist was in the office.  The patient presents as pleasant and cooperative.  The patient talked about getting ready to go to work and she loves her job.  The patient did report that she went to her doctor and her doctor reported that the patient needs to continue to work with herself on stabilizing her mood.  We talked about her diagnoses that her primary care doctor had given her and I explained to her that likely she has her diagnosed with a personality disorder because she may experience mood swings.  I explained to her that sometimes when people have been traumatized they often times look as if they might have personality issues or bipolar disorder and it may resolve after therapy work goes on.  The patient also talked about her relationship with her son and what has been happening with  that and she seems to be doing somewhat better since she does not have quite as much stress as she has had before. Interventions: Cognitive Behavioral Therapy and Mindfulness Meditation  Diagnosis:Major depressive disorder, recurrent episode, moderate  Generalized anxiety disorder  PTSD (post-traumatic stress disorder)  Plan: Treatment Plan   Strengths/Abilities:  intelligent, motivated   Treatment Preferences: Outpatient Individual therapy  Statement of Needs:  "I have anxiety and depression"  Symptoms:  Autonomic hyperactivity (e.g., palpitations, shortness of breath, dry mouth, trouble swallowing,  nausea, diarrhea).: (Status: improved). Excessive and/or unrealistic worry that  is difficult to control occurring more days than not for at least 6 months about a number of events or  activities.: (Status: improved). Hypervigilance (e.g., feeling constantly on  edge, experiencing concentration difficulties, having trouble falling or staying asleep, exhibiting a  general state of irritability).: (Status: improved). Motor tension (e.g.,  restlessness, tiredness, shakiness, muscle tension).: (Status: improved).  Problems Addressed:  Depression and Anxiety  Goals:  LTG:1. Enhance ability to effectively cope with the full variety of life's worries  and anxieties.  50% 2. Learn and implement coping skills that result in a reduction of anxiety  and worry, and improved daily functioning  50% 3. Reduce overall frequency, intensity, and duration of the anxiety so that  daily functioning is not impaired.  50% 4. Resolve the core conflict that is the source of anxiety.  10% 5. Stabilize anxiety level while increasing ability to function on a daily  20% STG: 1. Identify, challenge, and replace  biased, fearful self-talk with positive, realistic, and empowering self talk  50% 2. Learn and implement problem-solving strategies for realistically addressing worries.  50%   3.Learn and implement  calming skills to reduce overall anxiety and manage anxiety symptoms.  59%   Target Date:  10/14/2023 Frequency:  weekly to biweekly Modality: Individual therapy Interventions by Therapist: CBT, problem solving, EMDR, insight oriented and mindfulness meditation  Patient has approved this treatment plan  Lisa Mckeel G Emrys Mckamie, Lisa Cole

## 2023-01-04 ENCOUNTER — Encounter: Payer: Self-pay | Admitting: Internal Medicine

## 2023-01-11 ENCOUNTER — Ambulatory Visit: Payer: Medicare PPO | Admitting: Psychology

## 2023-01-14 ENCOUNTER — Encounter: Payer: Self-pay | Admitting: Dietician

## 2023-01-14 ENCOUNTER — Encounter: Payer: Medicare PPO | Attending: Internal Medicine | Admitting: Dietician

## 2023-01-14 VITALS — Ht 60.0 in | Wt 165.0 lb

## 2023-01-14 DIAGNOSIS — E1165 Type 2 diabetes mellitus with hyperglycemia: Secondary | ICD-10-CM | POA: Diagnosis present

## 2023-01-14 DIAGNOSIS — Z794 Long term (current) use of insulin: Secondary | ICD-10-CM | POA: Insufficient documentation

## 2023-01-14 NOTE — Patient Instructions (Addendum)
Drink more water   Consider discussing insulin pump readiness with Dr. Elvera Lennox.  iLet Pancrease insulin pump could be an option  First meal of the day ideas:  Cheese toast, fruit  Mini bagel, peanut butter, fruit  Greek yogurt and nuts or granola  Protein bar ([employer] Protein bar or Kind)  Dinner ideas:  Soup (be sure to eat a protein)  Kid's meal  Grilled chicken, sweet potato, broccoli  Salad with beans or chicken or tuna  Eat for nutrition Eat before exercise Treat the low as soon as you are alerted.

## 2023-01-14 NOTE — Progress Notes (Signed)
Medical Nutrition Therapy  Appointment Start time:  1530  Appointment End time:  1630  Primary concerns today:  States that she "hates diabetes".  States that her blood glucose has decreased from 600 to the 200's.  She is on Ozempic and wants to continue this due to blood glucose control but states that her GI doctor does not want her on this.   States that diabetes makes her "hate herself" and that is why she came today. States that she only eats one meal per day, no snacks, no appetite, dislikes leftovers, eats most meals out and has budget concerns because of this.  Would like baby steps for improvement. 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  She has 2 sons and 1 daughter. She sees a Veterinary surgeon weekly.  Pelvic floor therapy. 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 Lost appetite after brain surgery in 2020.  Ate increased amounts of sugar and now has increased cavities at that time and has not had them fixed. Does not have an appetite due to Ozempic. Doesn't life leftovers. Only eats 1 meal per day.  No snacks.  Estimated  daily fluid intake: 8 oz  Unable to drink (doesn't remember) Supplements: Vitamin D2 Sleep: 4-5 hours plus occasional nap  (stopped caffeine most of the time and sleeps better) Stress / self-care: high Current average weekly physical activity: loves to dance and does a weight class  24-Hr Dietary Recall First Meal: pork chop biscuit USUALY skips  Snack: none Second Meal: none Snack: none Third Meal: small portion fried fish, 6 oysters and 4 shrimp, 1 hush puppies, a few french fries  Snack: none Beverages: water, occasional regular coke, occasional regular lemonade   Estimated Energy Needs - per day Calories: 1200 Protein: 50-60g   NUTRITION DIAGNOSIS  NB-1.1 Food and nutrition-related knowledge deficit As related to balance of carbohydrates, protein, and fat.  As evidenced by diet hx and patient report.   NUTRITION INTERVENTION  Nutrition education (E-1) on the following topics:  Importance of adequate nutrition Patient's poor appetite, GI concerns and implication on her eating Simple meal ideas  Handouts Provided Include  none  Learning Style & Readiness for Change Teaching method utilized: Visual & Auditory  Demonstrated degree of understanding via: Teach Back  Barriers to learning/adherence to lifestyle change: diabetes distress  Goals Established by Pt Adequate hydration Improved nutrition (add another small meal) Drink more water   Consider discussing insulin pump readiness with Dr. Elvera Lennox.  iLet Pancrease insulin pump could be an option  First meal of the day ideas:  Cheese toast, fruit  Mini bagel, peanut butter, fruit  Greek yogurt and nuts or granola  Protein bar [employer] Protein bar or  Kind)  Dinner ideas:  Soup (be sure to eat a protein)  Kid's meal  Grilled chicken, sweet potato, broccoli  Salad with beans or chicken or tuna  Eat for nutrition Eat before exercise Treat the low as soon as you are alerted.     MONITORING &  EVALUATION Dietary intake, weekly physical activity 6 weeks.  Next Steps  Patient is to call for questions.

## 2023-01-18 ENCOUNTER — Ambulatory Visit (INDEPENDENT_AMBULATORY_CARE_PROVIDER_SITE_OTHER): Payer: Medicare PPO | Admitting: Psychology

## 2023-01-18 DIAGNOSIS — F331 Major depressive disorder, recurrent, moderate: Secondary | ICD-10-CM | POA: Diagnosis not present

## 2023-01-18 DIAGNOSIS — F4310 Post-traumatic stress disorder, unspecified: Secondary | ICD-10-CM

## 2023-01-18 DIAGNOSIS — F411 Generalized anxiety disorder: Secondary | ICD-10-CM | POA: Diagnosis not present

## 2023-01-18 NOTE — Progress Notes (Signed)
[facility] Counselor/Therapist Progress Notes  Patient ID: Lisa Cole, MRN: [id number],    Date: 01/18/2023  Time Spent: 60 minutes  Treatment Type: Individual Therapy  Reported Symptoms: worry, depression, physical pain  Mental Status Exam: Appearance:  Casual  Behavior: normal  Motor: normal  Speech/Language:  normal  Affect: Full Range  Mood: normal  Thought process: pleasant  Thought content:   WNL  Sensory/Perceptual disturbances:   WNL  Orientation: oriented to person, place, time/date, and situation  Attention: Good  Concentration: Fair  Memory: WNL  Fund of knowledge:  Good  Insight:   Fair  Judgment:  Poor  Impulse Control: Poor   Risk Assessment: Danger to Self:  No Self-injurious Behavior: No Danger to Others: No Duty to Warn:no Physical Aggression / Violence:No  Access to Firearms a concern: No  Gang Involvement:No   Subjective: The patient attended a face-to-face individual therapy session via video visit on caregility today.  The patient gave verbal consent for the session to be on video on caregility.  The patient was in her home alone and therapist was in the office.  The patient presents as pleasant and cooperative.  The patient reports that she is getting ready to do some of the activities that her daughter has going on because she is graduating from college.  The patient states that she feels like she is doing okay right now and is still working towards getting a house for herself.  She talked today about her frustrations with her youngest child and some of the things that he has been dealing with and we problem solved on some of the things that she might need to suggest to him.  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  We talked about this and she is going to bring her computer of the next time she comes and she is going to do it in person so that we can look at it and  figure out what it is that she needs to do.  Interventions: Cognitive Behavioral Therapy and Mindfulness Meditation  Diagnosis:Major depressive disorder, recurrent episode, moderate (HCC)  Generalized anxiety disorder  PTSD (post-traumatic stress disorder)  Plan: Treatment Plan   Strengths/Abilities:  intelligent, motivated   Treatment Preferences: Outpatient Individual therapy  Statement of Needs:  "I have anxiety and depression"  Symptoms:  Autonomic hyperactivity (e.g., palpitations, shortness of breath, dry mouth, trouble swallowing,  nausea, diarrhea).: (Status: improved). Excessive and/or unrealistic worry that  is difficult to control occurring more days than not for at least 6 months about a number of events or  activities.: (Status: improved). Hypervigilance (e.g., feeling constantly on  edge, experiencing concentration difficulties, having trouble falling or staying asleep, exhibiting a  general state of irritability).: (Status: improved). Motor tension (e.g.,  restlessness, tiredness, shakiness, muscle tension).: (Status: improved).  Problems Addressed:  Depression and Anxiety  Goals:  LTG:1. Enhance ability to effectively cope with the full variety of life's worries  and anxieties.  50% 2. Learn and implement coping skills that result in a reduction of anxiety  and worry, and improved daily functioning  50% 3. Reduce overall frequency, intensity, and duration of the anxiety so that  daily functioning is not impaired.  50% 4. Resolve the core conflict that is the source of anxiety.  10% 5. Stabilize anxiety level while increasing ability to function on a daily  20% STG: 1. Identify, challenge, and replace biased, fearful  self-talk with positive, realistic, and empowering self talk  50% 2. Learn and implement problem-solving strategies for realistically addressing worries.  50%   3.Learn and implement calming skills to reduce overall anxiety and manage anxiety  symptoms.  59%   Target Date:  10/14/2023 Frequency:  weekly to biweekly Modality: Individual therapy Interventions by Therapist: CBT, problem solving, EMDR, insight oriented and mindfulness meditation  Patient has approved this treatment plan  Lilyan Prete G Estle Huguley, [name]

## 2023-01-24 ENCOUNTER — Other Ambulatory Visit: Payer: Self-pay | Admitting: Internal Medicine

## 2023-01-24 ENCOUNTER — Ambulatory Visit: Payer: Medicare PPO | Admitting: Psychology

## 2023-01-24 DIAGNOSIS — E1165 Type 2 diabetes mellitus with hyperglycemia: Secondary | ICD-10-CM

## 2023-01-25 ENCOUNTER — Telehealth: Payer: Self-pay

## 2023-01-25 NOTE — Telephone Encounter (Signed)
Order placed on parachute online portal for Freestyle Libre 3 through [facility].

## 2023-01-31 MED ORDER — GLUCOSE BLOOD VI STRP: ORAL_STRIP | 11 refills | Status: DC

## 2023-02-07 ENCOUNTER — Ambulatory Visit: Payer: Medicare PPO | Admitting: Dietician

## 2023-02-08 ENCOUNTER — Ambulatory Visit (INDEPENDENT_AMBULATORY_CARE_PROVIDER_SITE_OTHER): Payer: Medicare PPO | Admitting: Psychology

## 2023-02-08 DIAGNOSIS — F411 Generalized anxiety disorder: Secondary | ICD-10-CM | POA: Diagnosis not present

## 2023-02-08 DIAGNOSIS — F331 Major depressive disorder, recurrent, moderate: Secondary | ICD-10-CM | POA: Diagnosis not present

## 2023-02-08 DIAGNOSIS — F4310 Post-traumatic stress disorder, unspecified: Secondary | ICD-10-CM | POA: Diagnosis not present

## 2023-02-08 NOTE — Progress Notes (Signed)
[facility] Counselor/Therapist Progress Notes  Patient ID: Lisa Cole, MRN: [id number],    Date: 02/08/2023  Time Spent: 60 minutes  Treatment Type: Individual Therapy  Reported Symptoms: worry, depression, physical pain  Mental Status Exam: Appearance:  Casual  Behavior: normal  Motor: normal  Speech/Language:  normal  Affect: Full Range  Mood: anxious  Thought process: anxious  Thought content:   WNL  Sensory/Perceptual disturbances:   WNL  Orientation: oriented to person, place, time/date, and situation  Attention: Good  Concentration: Fair  Memory: WNL  Fund of knowledge:  Good  Insight:   Fair  Judgment:  Poor  Impulse Control: Poor   Risk Assessment: Danger to Self:  No Self-injurious Behavior: No Danger to Others: No Duty to Warn:no Physical Aggression / Violence:No  Access to Firearms a concern: No  Gang Involvement:No   Subjective: The patient attended a face-to-face individual therapy session via video visit on caregility today.  The patient gave verbal consent for the session to be on video on caregility.  The patient was in her car alone and therapist was in the office.  The patient presents with a blunted affect and mood is anxious.  She was supposed to come into the office today but reports that she was struggling to get here so we ended up switching it to a video visit.  The patient reports that she has had a lot of stress this week dealing with her children and also not feeling very well.  She described having pressure on her brain and she does have an MRI scheduled for June.  In a way she was describing having suicidal thoughts but then she was talking about having pressure in her head.  She denies suicidal thoughts at present but I am concerned that the stress is causing her angst and I hope that there is nothing going on in her brain again.  I encouraged her and she promised to contact the MRI but this she felt more pressure or if she  felt that something was not right.  She agreed to do this.  She reviewed what was going on with her younger son and older son.  I gave her some resources to deal with her older son because he seems like he needs to be evaluated for mental health issues.  Encouraged the patient to do self-care and made several follow-up appointments. Interventions: Cognitive Behavioral Therapy and Mindfulness Meditation  Diagnosis:Major depressive disorder, recurrent episode, moderate (HCC)  Generalized anxiety disorder  PTSD (post-traumatic stress disorder)  Plan: Treatment Plan   Strengths/Abilities:  intelligent, motivated   Treatment Preferences: Outpatient Individual therapy  Statement of Needs:  "I have anxiety and depression"  Symptoms:  Autonomic hyperactivity (e.g., palpitations, shortness of breath, dry mouth, trouble swallowing,  nausea, diarrhea).: (Status: improved). Excessive and/or unrealistic worry that  is difficult to control occurring more days than not for at least 6 months about a number of events or  activities.: (Status: improved). Hypervigilance (e.g., feeling constantly on  edge, experiencing concentration difficulties, having trouble falling or staying asleep, exhibiting a  general state of irritability).: (Status: improved). Motor tension (e.g.,  restlessness, tiredness, shakiness, muscle tension).: (Status: improved).  Problems Addressed:  Depression and Anxiety  Goals:  LTG:1. Enhance ability to effectively cope with the full variety of life's worries  and anxieties.  50% 2. Learn and implement coping skills that result in a reduction of anxiety  and worry, and improved daily functioning  50% 3. Reduce overall frequency,  intensity, and duration of the anxiety so that  daily functioning is not impaired.  50% 4. Resolve the core conflict that is the source of anxiety.  10% 5. Stabilize anxiety level while increasing ability to function on a daily  20% STG: 1. Identify,  challenge, and replace biased, fearful self-talk with positive, realistic, and empowering self talk  50% 2. Learn and implement problem-solving strategies for realistically addressing worries.  50%   3.Learn and implement calming skills to reduce overall anxiety and manage anxiety symptoms.  59%   Target Date:  10/14/2023 Frequency:  weekly to biweekly Modality: Individual therapy Interventions by Therapist: CBT, problem solving, EMDR, insight oriented and mindfulness meditation  Patient has approved this treatment plan  Dymin Dingledine G Rebbie Lauricella, [name]

## 2023-02-13 ENCOUNTER — Ambulatory Visit (INDEPENDENT_AMBULATORY_CARE_PROVIDER_SITE_OTHER): Payer: Medicare PPO | Admitting: Psychology

## 2023-02-13 DIAGNOSIS — F411 Generalized anxiety disorder: Secondary | ICD-10-CM | POA: Diagnosis not present

## 2023-02-13 DIAGNOSIS — F4310 Post-traumatic stress disorder, unspecified: Secondary | ICD-10-CM

## 2023-02-13 DIAGNOSIS — F331 Major depressive disorder, recurrent, moderate: Secondary | ICD-10-CM

## 2023-02-13 NOTE — Progress Notes (Signed)
                Lisa Cole G Talmadge Ganas, [name]

## 2023-02-22 ENCOUNTER — Ambulatory Visit: Payer: Medicare PPO | Admitting: Psychology

## 2023-02-26 ENCOUNTER — Ambulatory Visit: Payer: Medicare PPO | Admitting: Psychology

## 2023-03-08 ENCOUNTER — Ambulatory Visit (INDEPENDENT_AMBULATORY_CARE_PROVIDER_SITE_OTHER): Payer: Medicare PPO | Admitting: Psychology

## 2023-03-08 DIAGNOSIS — F4310 Post-traumatic stress disorder, unspecified: Secondary | ICD-10-CM

## 2023-03-08 DIAGNOSIS — F331 Major depressive disorder, recurrent, moderate: Secondary | ICD-10-CM

## 2023-03-08 DIAGNOSIS — F411 Generalized anxiety disorder: Secondary | ICD-10-CM | POA: Diagnosis not present

## 2023-03-08 NOTE — Progress Notes (Signed)
                Lisa Cole G Lisa Boyland, [name]

## 2023-03-15 ENCOUNTER — Ambulatory Visit: Payer: Medicare PPO | Admitting: Psychology

## 2023-03-22 ENCOUNTER — Ambulatory Visit (INDEPENDENT_AMBULATORY_CARE_PROVIDER_SITE_OTHER): Payer: Medicare PPO | Admitting: Psychology

## 2023-03-22 DIAGNOSIS — F4310 Post-traumatic stress disorder, unspecified: Secondary | ICD-10-CM | POA: Diagnosis not present

## 2023-03-22 DIAGNOSIS — F331 Major depressive disorder, recurrent, moderate: Secondary | ICD-10-CM | POA: Diagnosis not present

## 2023-03-22 DIAGNOSIS — F411 Generalized anxiety disorder: Secondary | ICD-10-CM

## 2023-03-22 NOTE — Progress Notes (Signed)
[facility] Counselor/Therapist Progress Notes  Patient ID: Lisa Cole, MRN: [id number],    Date: 03/22/2023  Time Spent: 60 minutes  Time in:  2:00  Time out:  3:00  Treatment Type: Individual Therapy  Reported Symptoms: worry, depression, physical pain  Mental Status Exam: Appearance:  Casual  Behavior: normal  Motor: normal  Speech/Language:  normal  Affect: Full Range  Mood: Pleasant  Thought process: WNL  Thought content:   WNL  Sensory/Perceptual disturbances:   WNL  Orientation: oriented to person, place, time/date, and situation  Attention: Good  Concentration: Fair  Memory: WNL  Fund of knowledge:  Good  Insight:   Fair  Judgment:  Poor  Impulse Control: Poor   Risk Assessment: Danger to Self:  No Self-injurious Behavior: No Danger to Others: No Duty to Warn:no Physical Aggression / Violence:No  Access to Firearms a concern: No  Gang Involvement:No   Subjective: The patient attended a face-to-face individual therapy session via video visit on caregility today.  The patient gave verbal consent for the session to be on video on caregility and is aware of the limitations of telehealth.  The patient was in her home alone and the therapist was in the office.  We had some difficulty keeping the video on today so we did finish the session by phone as we were having sound issues and Camera issues.  The patient reports that she had her MRI and there seems to be a little bit of fluid on her brain but it is possible that she does not have to have anything else done because of it.  I encouraged her to make sure that she is taking care of herself.  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  I encouraged her to be mindful about things and pay attention and it is possible that she might be able to have a fundraiser and figure out a way to raise the money that she  needs.  She seems to always be able to access resources and she was encouraged to stay as positive as possible about that.  In addition we talked about her frustrations with her son that is living with her right now while he is out of college.  He is somewhat disrespectful to her and this is very frustrating for her.  The patient does seem to be managing her emotions a little better than she was about a year ago. Interventions: Cognitive Behavioral Therapy and Mindfulness Meditation, problem solving and psychoeducation.  Diagnosis:Major depressive disorder, recurrent episode, moderate (HCC)  Generalized anxiety disorder  PTSD (post-traumatic stress disorder)  Plan: Treatment Plan   Strengths/Abilities:  intelligent, motivated   Treatment Preferences: Outpatient Individual therapy  Statement of Needs:  "I have anxiety and depression"  Symptoms:  Autonomic hyperactivity (e.g., palpitations, shortness of breath, dry mouth, trouble swallowing,  nausea, diarrhea).: (Status: improved). Excessive and/or unrealistic worry that  is difficult to control occurring more days than not for at least 6 months about a number of events or  activities.: (Status: improved). Hypervigilance (e.g., feeling constantly on  edge, experiencing concentration difficulties, having trouble falling or staying asleep, exhibiting a  general state of irritability).: (Status: improved). Motor tension (e.g.,  restlessness, tiredness, shakiness, muscle tension).: (Status: improved).  Problems Addressed:  Depression and Anxiety  Goals:  LTG:1. Enhance ability to effectively cope with the full variety of life's worries  and anxieties.  60% 2. Learn and implement coping skills that result in a reduction of anxiety  and worry, and improved daily functioning  60% 3. Reduce overall frequency, intensity, and duration of the anxiety so that  daily functioning is not impaired.  50% 4. Resolve the core conflict that is the source  of anxiety.  10% 5. Stabilize anxiety level while increasing ability to function on a daily  20% STG: 1. Identify, challenge, and replace biased, fearful self-talk with positive, realistic, and empowering self talk  50% 2. Learn and implement problem-solving strategies for realistically addressing worries.  50%   3.Learn and implement calming skills to reduce overall anxiety and manage anxiety symptoms.  59%   Target Date:  10/14/2023 Frequency:  weekly to biweekly Modality: Individual therapy Interventions by Therapist: CBT, problem solving, EMDR, insight oriented and mindfulness meditation  Patient has approved this treatment plan  Kathalina Ostermann G Katharyn Schauer, [name]

## 2023-04-02 ENCOUNTER — Ambulatory Visit: Payer: Medicare PPO | Admitting: Dietician

## 2023-04-05 ENCOUNTER — Ambulatory Visit (INDEPENDENT_AMBULATORY_CARE_PROVIDER_SITE_OTHER): Payer: Medicare PPO | Admitting: Internal Medicine

## 2023-04-05 ENCOUNTER — Ambulatory Visit (INDEPENDENT_AMBULATORY_CARE_PROVIDER_SITE_OTHER): Payer: Medicare PPO | Admitting: Psychology

## 2023-04-05 ENCOUNTER — Encounter: Payer: Self-pay | Admitting: Internal Medicine

## 2023-04-05 VITALS — BP 120/70 | HR 82 | Ht 60.0 in | Wt 164.6 lb

## 2023-04-05 DIAGNOSIS — F4310 Post-traumatic stress disorder, unspecified: Secondary | ICD-10-CM | POA: Diagnosis not present

## 2023-04-05 DIAGNOSIS — E119 Type 2 diabetes mellitus without complications: Secondary | ICD-10-CM | POA: Diagnosis not present

## 2023-04-05 DIAGNOSIS — F331 Major depressive disorder, recurrent, moderate: Secondary | ICD-10-CM

## 2023-04-05 DIAGNOSIS — E1165 Type 2 diabetes mellitus with hyperglycemia: Secondary | ICD-10-CM

## 2023-04-05 DIAGNOSIS — Z794 Long term (current) use of insulin: Secondary | ICD-10-CM

## 2023-04-05 DIAGNOSIS — F411 Generalized anxiety disorder: Secondary | ICD-10-CM

## 2023-04-05 DIAGNOSIS — Z7985 Long-term (current) use of injectable non-insulin antidiabetic drugs: Secondary | ICD-10-CM

## 2023-04-05 DIAGNOSIS — E038 Other specified hypothyroidism: Secondary | ICD-10-CM | POA: Diagnosis not present

## 2023-04-05 DIAGNOSIS — E063 Autoimmune thyroiditis: Secondary | ICD-10-CM

## 2023-04-05 LAB — HEMOGLOBIN A1C: Hemoglobin A1C: 8.5

## 2023-04-05 MED ORDER — FREESTYLE LIBRE 3 READER DEVI: 1.0000 | Freq: Once | 0 refills | Status: AC

## 2023-04-05 MED ORDER — FREESTYLE LIBRE 3 SENSOR MISC: 1.0000 | 3 refills | Status: DC

## 2023-04-05 MED ORDER — TIRZEPATIDE 2.5 MG/0.5ML ~~LOC~~ SOAJ: 2.5000 mg | SUBCUTANEOUS | 2 refills | Status: DC

## 2023-04-05 NOTE — Progress Notes (Signed)
[facility] Counselor/Therapist Progress Notes  Patient ID: Ladean DESTYNE DURAN, MRN: [id number],    Date: 04/05/2023  Time Spent: 60 minutes  Time in:  3:00  Time out:  4:00  Treatment Type: Individual Therapy  Reported Symptoms: worry, depression, physical pain  Mental Status Exam: Appearance:  Casual  Behavior: normal  Motor: normal  Speech/Language:  normal  Affect: Full Range  Mood: Pleasant  Thought process: WNL  Thought content:   WNL  Sensory/Perceptual disturbances:   WNL  Orientation: oriented to person, place, time/date, and situation  Attention: Good  Concentration: Fair  Memory: WNL  Fund of knowledge:  Good  Insight:   Fair  Judgment:  Poor  Impulse Control: Poor   Risk Assessment: Danger to Self:  No Self-injurious Behavior: No Danger to Others: No Duty to Warn:no Physical Aggression / Violence:No  Access to Firearms a concern: No  Gang Involvement:No   Subjective: The patient attended a face-to-face individual therapy session via video visit on caregility today.  The patient gave verbal consent for the session to be on video on caregility and is aware of the limitations of telehealth.  The patient was in her home alone and the therapist was in the office.  The patient talked today about her health issues and she seems to be on the right track with who she needs to do to get treated.  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  I encouraged her to continue to pay attention to how she feels about that situation and set limits and boundaries around that.  Lastly the thing we talked about was her relationship that she has had with a man for several years and had broken up with him in the last year because he was not giving her what she needs.  The patient talked about him doing some things that is out of character for him and she felt like maybe he was making some changes.  I explained  to her that this is her decision as to whether she wants to pursue that relationship but that she needs to just watch what is happening.  We talked about her writing down the things that she would want from the relationship and also talked about how to have a conversation with a female friend who really does not want her to get back together with this person.  I reiterated that it was her decision and not her friend's decision or my decision but that she needs to make sure that he the changes he is making are really going to stick before she decides to get back into the relationship.  Interventions: Cognitive Behavioral Therapy and Mindfulness Meditation, problem solving and psychoeducation.  Diagnosis:Major depressive disorder, recurrent episode, moderate (HCC)  Generalized anxiety disorder  PTSD (post-traumatic stress disorder)  Plan: Treatment Plan   Strengths/Abilities:  intelligent, motivated   Treatment Preferences: Outpatient Individual therapy  Statement of Needs:  "I have anxiety and depression"  Symptoms:  Autonomic hyperactivity (e.g., palpitations, shortness of breath, dry mouth, trouble swallowing,  nausea, diarrhea).: (Status: improved). Excessive and/or unrealistic worry that  is difficult to control occurring more days than not for at least 6 months about a number of events or  activities.: (Status: improved). Hypervigilance (e.g., feeling constantly on  edge, experiencing concentration difficulties, having trouble falling or staying asleep, exhibiting a  general state of irritability).: (Status: improved). Motor tension (e.g.,  restlessness, tiredness,  shakiness, muscle tension).: (Status: improved).  Problems Addressed:  Depression and Anxiety  Goals:  LTG:1. Enhance ability to effectively cope with the full variety of life's worries  and anxieties.  60% 2. Learn and implement coping skills that result in a reduction of anxiety  and worry, and improved daily  functioning  60% 3. Reduce overall frequency, intensity, and duration of the anxiety so that  daily functioning is not impaired.  50% 4. Resolve the core conflict that is the source of anxiety.  10% 5. Stabilize anxiety level while increasing ability to function on a daily  20% STG: 1. Identify, challenge, and replace biased, fearful self-talk with positive, realistic, and empowering self talk  50% 2. Learn and implement problem-solving strategies for realistically addressing worries.  50%   3.Learn and implement calming skills to reduce overall anxiety and manage anxiety symptoms.  59%   Target Date:  10/14/2023 Frequency:  weekly to biweekly Modality: Individual therapy Interventions by Therapist: CBT, problem solving, EMDR, insight oriented and mindfulness meditation  Patient has approved this treatment plan  Galilee Pierron G Thandiwe Siragusa, [name]

## 2023-04-05 NOTE — Patient Instructions (Addendum)
Please increase: - Humulin N 25 units 2x a day  Continue: - Humalog 20 units 3x a day 15 min before meals or sweet drinks  Switch from Ozempic to: - Mounjaro 2.5 mg weekly  Let me know if we can increase the dose to 5 mg weekly.   Please return in 3-4 months.

## 2023-04-05 NOTE — Addendum Note (Signed)
Addended by: Pollie Meyer on: 04/05/2023 02:14 PM   Modules accepted: Orders
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
## 2023-04-19 ENCOUNTER — Ambulatory Visit (INDEPENDENT_AMBULATORY_CARE_PROVIDER_SITE_OTHER): Payer: Medicare PPO | Admitting: Psychology

## 2023-04-19 DIAGNOSIS — F331 Major depressive disorder, recurrent, moderate: Secondary | ICD-10-CM | POA: Diagnosis not present

## 2023-04-19 DIAGNOSIS — F4310 Post-traumatic stress disorder, unspecified: Secondary | ICD-10-CM

## 2023-04-19 DIAGNOSIS — F411 Generalized anxiety disorder: Secondary | ICD-10-CM | POA: Diagnosis not present

## 2023-04-19 NOTE — Progress Notes (Unsigned)
                Bambi G Cottle, [name] 

## 2023-04-26 ENCOUNTER — Ambulatory Visit (INDEPENDENT_AMBULATORY_CARE_PROVIDER_SITE_OTHER): Payer: Medicare PPO | Admitting: Psychology

## 2023-04-26 DIAGNOSIS — F411 Generalized anxiety disorder: Secondary | ICD-10-CM | POA: Diagnosis not present

## 2023-04-26 DIAGNOSIS — F331 Major depressive disorder, recurrent, moderate: Secondary | ICD-10-CM

## 2023-04-26 DIAGNOSIS — F4310 Post-traumatic stress disorder, unspecified: Secondary | ICD-10-CM

## 2023-04-26 NOTE — Progress Notes (Unsigned)
                Camillo Quadros G Ameliah Baskins, [name] 

## 2023-05-03 ENCOUNTER — Ambulatory Visit: Payer: Medicare PPO | Admitting: Psychology

## 2023-05-03 DIAGNOSIS — F331 Major depressive disorder, recurrent, moderate: Secondary | ICD-10-CM

## 2023-05-03 DIAGNOSIS — F411 Generalized anxiety disorder: Secondary | ICD-10-CM | POA: Diagnosis not present

## 2023-05-03 DIAGNOSIS — F4310 Post-traumatic stress disorder, unspecified: Secondary | ICD-10-CM | POA: Diagnosis not present
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
## 2023-05-17 ENCOUNTER — Ambulatory Visit: Payer: Medicare PPO | Admitting: Psychology

## 2023-05-17 DIAGNOSIS — F4310 Post-traumatic stress disorder, unspecified: Secondary | ICD-10-CM

## 2023-05-17 DIAGNOSIS — F331 Major depressive disorder, recurrent, moderate: Secondary | ICD-10-CM

## 2023-05-17 DIAGNOSIS — F411 Generalized anxiety disorder: Secondary | ICD-10-CM
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
## 2023-05-20 ENCOUNTER — Other Ambulatory Visit: Payer: Self-pay | Admitting: Family Medicine

## 2023-05-20 DIAGNOSIS — E010 Iodine-deficiency related diffuse (endemic) goiter: Secondary | ICD-10-CM

## 2023-05-24 ENCOUNTER — Ambulatory Visit (INDEPENDENT_AMBULATORY_CARE_PROVIDER_SITE_OTHER): Payer: Medicare PPO | Admitting: Psychology

## 2023-05-24 DIAGNOSIS — F4310 Post-traumatic stress disorder, unspecified: Secondary | ICD-10-CM | POA: Diagnosis not present

## 2023-05-24 DIAGNOSIS — F411 Generalized anxiety disorder: Secondary | ICD-10-CM

## 2023-05-24 DIAGNOSIS — F331 Major depressive disorder, recurrent, moderate: Secondary | ICD-10-CM | POA: Diagnosis not present
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
## 2023-05-28 ENCOUNTER — Encounter: Payer: Self-pay | Admitting: Internal Medicine

## 2023-05-28 ENCOUNTER — Other Ambulatory Visit: Payer: Self-pay | Admitting: Internal Medicine

## 2023-05-28 MED ORDER — TIRZEPATIDE 5 MG/0.5ML ~~LOC~~ SOAJ: 5.0000 mg | SUBCUTANEOUS | 3 refills | Status: DC

## 2023-05-28 NOTE — Telephone Encounter (Signed)
Ok to Increase?

## 2023-05-29 ENCOUNTER — Ambulatory Visit: Admission: RE | Admit: 2023-05-29 | Payer: Medicare PPO | Source: Ambulatory Visit

## 2023-05-29 DIAGNOSIS — E010 Iodine-deficiency related diffuse (endemic) goiter: Secondary | ICD-10-CM

## 2023-05-31 ENCOUNTER — Ambulatory Visit (INDEPENDENT_AMBULATORY_CARE_PROVIDER_SITE_OTHER): Payer: Medicare PPO | Admitting: Psychology

## 2023-05-31 DIAGNOSIS — F4310 Post-traumatic stress disorder, unspecified: Secondary | ICD-10-CM

## 2023-05-31 DIAGNOSIS — F411 Generalized anxiety disorder: Secondary | ICD-10-CM

## 2023-05-31 DIAGNOSIS — F331 Major depressive disorder, recurrent, moderate: Secondary | ICD-10-CM | POA: Diagnosis not present

## 2023-05-31 NOTE — Progress Notes (Unsigned)
                Scottlyn Mchaney G Ahmyah Gidley, [name]

## 2023-06-14 ENCOUNTER — Ambulatory Visit (INDEPENDENT_AMBULATORY_CARE_PROVIDER_SITE_OTHER): Payer: Medicare PPO | Admitting: Psychology

## 2023-06-14 DIAGNOSIS — F411 Generalized anxiety disorder: Secondary | ICD-10-CM | POA: Diagnosis not present

## 2023-06-14 DIAGNOSIS — F331 Major depressive disorder, recurrent, moderate: Secondary | ICD-10-CM

## 2023-06-14 DIAGNOSIS — F4310 Post-traumatic stress disorder, unspecified: Secondary | ICD-10-CM | POA: Diagnosis not present

## 2023-06-14 NOTE — Progress Notes (Unsigned)
                Diona Peregoy G Denario Bagot, [name] 

## 2023-06-21 ENCOUNTER — Ambulatory Visit (INDEPENDENT_AMBULATORY_CARE_PROVIDER_SITE_OTHER): Payer: Medicare PPO | Admitting: Psychology

## 2023-06-21 DIAGNOSIS — F4310 Post-traumatic stress disorder, unspecified: Secondary | ICD-10-CM | POA: Diagnosis not present

## 2023-06-21 DIAGNOSIS — F411 Generalized anxiety disorder: Secondary | ICD-10-CM

## 2023-06-21 DIAGNOSIS — F331 Major depressive disorder, recurrent, moderate: Secondary | ICD-10-CM

## 2023-06-21 NOTE — Progress Notes (Unsigned)
                Esme Freund G Kalley Nicholl, [name]

## 2023-06-28 ENCOUNTER — Ambulatory Visit: Payer: Medicare PPO | Admitting: Psychology

## 2023-07-05 ENCOUNTER — Ambulatory Visit (INDEPENDENT_AMBULATORY_CARE_PROVIDER_SITE_OTHER): Payer: Medicare PPO | Admitting: Psychology

## 2023-07-05 DIAGNOSIS — F411 Generalized anxiety disorder: Secondary | ICD-10-CM | POA: Diagnosis not present

## 2023-07-05 DIAGNOSIS — F331 Major depressive disorder, recurrent, moderate: Secondary | ICD-10-CM

## 2023-07-05 DIAGNOSIS — F4310 Post-traumatic stress disorder, unspecified: Secondary | ICD-10-CM

## 2023-07-05 NOTE — Progress Notes (Signed)
[facility] Counselor/Therapist Progress Notes  Patient ID: Lisa Cole, MRN: [id number],    Date: 07/05/2023  Time Spent:  49 minutes  Time in:  12:00 Time out:  12:49  Treatment Type: Individual Therapy  Reported Symptoms: worry, depression, physical pain  Mental Status Exam: Appearance:  Casual  Behavior: normal  Motor: normal  Speech/Language:  normal  Affect: blunted  Mood: anxious  Thought process: WNL  Thought content:   WNL  Sensory/Perceptual disturbances:   WNL  Orientation: oriented to person, place, time/date, and situation  Attention: Good  Concentration: Fair  Memory: WNL  Fund of knowledge:  Good  Insight:   Fair  Judgment:  Poor  Impulse Control: Poor   Risk Assessment: Danger to Self:  No Self-injurious Behavior: No Danger to Others: No Duty to Warn:no Physical Aggression / Violence:No  Access to Firearms a concern: No  Gang Involvement:No   Subjective: The patient attended a face-to-face individual therapy session via video visit on caregility today.  The patient gave verbal consent for the session to be on video on caregility and is aware of the limitations of telehealth.  The patient was in her car alone and the therapist was in the office.  The patient reports that she has been extremely busy.  She talked about being frustrated that she has to work so hard to get money.  She has been doing [place] with her son and working at [employer]  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  We talked about how to structure her time.  We also talked about the need for her to take a break so that she can relax and stay balanced.    Interventions: Cognitive Behavioral Therapy and Mindfulness Meditation, problem solving and psychoeducation.  Diagnosis:Major depressive disorder, recurrent episode, moderate (HCC)  Generalized anxiety disorder  PTSD (post-traumatic stress disorder)  Plan: Treatment Plan   Strengths/Abilities:  intelligent, motivated    Treatment Preferences: Outpatient Individual therapy  Statement of Needs:  "I have anxiety and depression"  Symptoms:  Autonomic hyperactivity (e.Cole., palpitations, shortness of breath, dry mouth, trouble swallowing,  nausea, diarrhea).: (Status: improved). Excessive and/or unrealistic worry that  is difficult to control occurring more days than not for at least 6 months about a number of events or  activities.: (Status: improved). Hypervigilance (e.Cole., feeling constantly on  edge, experiencing concentration difficulties, having trouble falling or staying asleep, exhibiting a  general state of irritability).: (Status: improved). Motor tension (e.Cole.,  restlessness, tiredness, shakiness, muscle tension).: (Status: improved).  Problems Addressed:  Depression and Anxiety  Goals:  LTG:1. Enhance ability to effectively cope with the full variety of life's worries  and anxieties.  60% 2. Learn and implement coping skills that result in a reduction of anxiety  and worry, and improved daily functioning  60% 3. Reduce overall frequency, intensity, and duration of the anxiety so that  daily functioning is not impaired.  50% 4. Resolve the core conflict that is the source of anxiety.  10% 5. Stabilize anxiety level while increasing ability to function on a daily  20% STG: 1. Identify, challenge, and replace biased, fearful self-talk with positive, realistic, and empowering self talk  50% 2. Learn and implement problem-solving strategies for realistically addressing worries.  50%   3.Learn and implement calming skills to reduce overall anxiety and manage anxiety symptoms.  50%   Target Date:  10/14/2023 Frequency:  weekly to biweekly Modality: Individual therapy Interventions by Therapist: CBT, problem solving, EMDR, insight oriented and mindfulness  meditation  Patient has approved this treatment plan  Lisa Cole Lisa Dicker,  [name]

## 2023-07-12 ENCOUNTER — Ambulatory Visit: Payer: Medicare PPO | Admitting: Psychology

## 2023-07-19 ENCOUNTER — Ambulatory Visit (INDEPENDENT_AMBULATORY_CARE_PROVIDER_SITE_OTHER): Payer: Medicare PPO | Admitting: Psychology

## 2023-07-19 DIAGNOSIS — F331 Major depressive disorder, recurrent, moderate: Secondary | ICD-10-CM

## 2023-07-19 DIAGNOSIS — F411 Generalized anxiety disorder: Secondary | ICD-10-CM

## 2023-07-19 DIAGNOSIS — F4310 Post-traumatic stress disorder, unspecified: Secondary | ICD-10-CM

## 2023-07-19 NOTE — Progress Notes (Unsigned)
                Katey Barrie G Elion Hocker, [name]

## 2023-08-02 ENCOUNTER — Ambulatory Visit (INDEPENDENT_AMBULATORY_CARE_PROVIDER_SITE_OTHER): Payer: Medicare PPO | Admitting: Psychology

## 2023-08-02 ENCOUNTER — Ambulatory Visit (INDEPENDENT_AMBULATORY_CARE_PROVIDER_SITE_OTHER): Payer: Medicare PPO | Admitting: Internal Medicine

## 2023-08-02 ENCOUNTER — Other Ambulatory Visit: Payer: Self-pay

## 2023-08-02 ENCOUNTER — Encounter: Payer: Self-pay | Admitting: Internal Medicine

## 2023-08-02 VITALS — BP 120/72 | HR 87 | Ht 60.0 in | Wt 158.2 lb

## 2023-08-02 DIAGNOSIS — E063 Autoimmune thyroiditis: Secondary | ICD-10-CM

## 2023-08-02 DIAGNOSIS — F4310 Post-traumatic stress disorder, unspecified: Secondary | ICD-10-CM | POA: Diagnosis not present

## 2023-08-02 DIAGNOSIS — F411 Generalized anxiety disorder: Secondary | ICD-10-CM | POA: Diagnosis not present

## 2023-08-02 DIAGNOSIS — E1165 Type 2 diabetes mellitus with hyperglycemia: Secondary | ICD-10-CM

## 2023-08-02 DIAGNOSIS — Z794 Long term (current) use of insulin: Secondary | ICD-10-CM | POA: Diagnosis not present

## 2023-08-02 DIAGNOSIS — E7849 Other hyperlipidemia: Secondary | ICD-10-CM

## 2023-08-02 DIAGNOSIS — F331 Major depressive disorder, recurrent, moderate: Secondary | ICD-10-CM

## 2023-08-02 LAB — LIPID PANEL
Cholesterol: 142 mg/dL (ref 0–200)
HDL: 45.2 mg/dL (ref >39.00)
LDL Cholesterol: 72 mg/dL (ref 0–99)
NonHDL: 96.37
Total CHOL/HDL Ratio: 3
Triglycerides: 124 mg/dL (ref 0.0–149.0)
VLDL: 24.8 mg/dL (ref 0.0–40.0)

## 2023-08-02 LAB — COMPREHENSIVE METABOLIC PANEL
ALT: 23 U/L (ref 0–35)
AST: 21 U/L (ref 0–37)
Albumin: 4.5 g/dL (ref 3.5–5.2)
Alkaline Phosphatase: 49 U/L (ref 39–117)
BUN: 12 mg/dL (ref 6–23)
CO2: 26 meq/L (ref 19–32)
Calcium: 10.2 mg/dL (ref 8.4–10.5)
Chloride: 103 meq/L (ref 96–112)
Creatinine, Ser: 0.84 mg/dL (ref 0.40–1.20)
GFR: 85.05 mL/min (ref >60.00)
Glucose, Bld: 163 mg/dL — ABNORMAL HIGH (ref 70–99)
Potassium: 4.2 meq/L (ref 3.5–5.1)
Sodium: 141 meq/L (ref 135–145)
Total Bilirubin: 0.5 mg/dL (ref 0.2–1.2)
Total Protein: 8 g/dL (ref 6.0–8.3)

## 2023-08-02 LAB — POCT GLYCOSYLATED HEMOGLOBIN (HGB A1C): Hemoglobin A1C: 7.9 % — AB (ref 4.0–5.6)

## 2023-08-02 LAB — TSH: TSH: 0.8 u[IU]/mL (ref 0.35–5.50)

## 2023-08-02 MED ORDER — HUMULIN N KWIKPEN 100 UNIT/ML ~~LOC~~ SUPN: 12.0000 [IU] | PEN_INJECTOR | Freq: Two times a day (BID) | SUBCUTANEOUS | Status: DC

## 2023-08-02 NOTE — Patient Instructions (Addendum)
Please change: - Humulin N 12 units and increase to 16 units at night - Humalog 7-10 units 15 min before meals  Continue: - Mounjaro 5 mg weekly   Please return in 3-4 months.
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
## 2023-08-03 LAB — MICROALBUMIN / CREATININE URINE RATIO
Creatinine, Urine: 190 mg/dL (ref 20–275)
Microalb Creat Ratio: 4 mg/g{creat} (ref <30)
Microalb, Ur: 0.7 mg/dL

## 2023-08-13 ENCOUNTER — Ambulatory Visit: Payer: Medicare PPO | Admitting: Psychology

## 2023-08-13 NOTE — Progress Notes (Signed)
Late cancellation               Briyan Kleven G Coralee Edberg, [name]

## 2023-08-25 ENCOUNTER — Emergency Department (HOSPITAL_BASED_OUTPATIENT_CLINIC_OR_DEPARTMENT_OTHER)
Admission: EM | Admit: 2023-08-25 | Discharge: 2023-08-25 | Disposition: A | Discharge status: Home / Self Care | Payer: Medicare PPO | Attending: Emergency Medicine | Admitting: Emergency Medicine

## 2023-08-25 ENCOUNTER — Emergency Department (HOSPITAL_BASED_OUTPATIENT_CLINIC_OR_DEPARTMENT_OTHER): Payer: Medicare PPO | Admitting: Radiology

## 2023-08-25 DIAGNOSIS — J988 Other specified respiratory disorders: Secondary | ICD-10-CM | POA: Insufficient documentation

## 2023-08-25 DIAGNOSIS — Z20822 Contact with and (suspected) exposure to covid-19: Secondary | ICD-10-CM | POA: Insufficient documentation

## 2023-08-25 DIAGNOSIS — I10 Essential (primary) hypertension: Secondary | ICD-10-CM | POA: Insufficient documentation

## 2023-08-25 DIAGNOSIS — J45901 Unspecified asthma with (acute) exacerbation: Secondary | ICD-10-CM | POA: Diagnosis not present

## 2023-08-25 DIAGNOSIS — H9201 Otalgia, right ear: Secondary | ICD-10-CM | POA: Diagnosis not present

## 2023-08-25 DIAGNOSIS — E119 Type 2 diabetes mellitus without complications: Secondary | ICD-10-CM | POA: Diagnosis not present

## 2023-08-25 DIAGNOSIS — Z794 Long term (current) use of insulin: Secondary | ICD-10-CM | POA: Insufficient documentation

## 2023-08-25 DIAGNOSIS — R0602 Shortness of breath: Secondary | ICD-10-CM | POA: Diagnosis present

## 2023-08-25 LAB — RESP PANEL BY RT-PCR (RSV, FLU A&B, COVID)  RVPGX2
Influenza A by PCR: NEGATIVE
Influenza B by PCR: NEGATIVE
Resp Syncytial Virus by PCR: NEGATIVE
SARS Coronavirus 2 by RT PCR: NEGATIVE

## 2023-08-25 LAB — CBG MONITORING, ED: Glucose-Capillary: 144 mg/dL — ABNORMAL HIGH (ref 70–99)

## 2023-08-25 MED ORDER — PREDNISONE 20 MG PO TABS: 40.0000 mg | ORAL_TABLET | Freq: Every day | ORAL | 0 refills | Status: DC

## 2023-08-25 MED ORDER — ALBUTEROL SULFATE HFA 108 (90 BASE) MCG/ACT IN AERS
2.0000 | INHALATION_SPRAY | RESPIRATORY_TRACT | Status: DC | PRN
  Administered 2023-08-25: 2 via RESPIRATORY_TRACT
  Filled 2023-08-25: qty 6.7

## 2023-08-25 NOTE — ED Provider Notes (Signed)
[place] EMERGENCY DEPARTMENT AT [facility] Provider Note   CSN: [id number] Arrival date & time: 08/25/23  8469     History  Chief Complaint  Patient presents with   Shortness of Breath    Lisa Cole is a 42 y.o. female.  Patient with history of diabetes, asthma, anemia, GERD, inappropriate sinus tachycardia, hypertension --presents to the emergency department for respiratory symptoms, wheezing and shortness of breath.  Patient states that she became sick about 2 weeks ago.  Initially she just had a lot of nasal congestion and drainage.  After about a week the symptoms resolved and then she felt better for a few days however 2 to 3 days ago she developed worsening congestion, sore throat, cough and right ear pain.  This caused her to have wheezing.  She has chest pain but only when she is coughing.  No nausea, vomiting, or diarrhea.  She had a fever when it started, however this has not recurred in 2 days.  She has been taking over-the-counter medications and Afrin with some improvement.       Home Medications Prior to Admission medications   Medication Sig Start Date End Date Taking? Authorizing Provider  albuterol (PROVENTIL HFA;VENTOLIN HFA) 108 (90 Base) MCG/ACT inhaler Inhale 2 puffs into the lungs every 4 (four) hours as needed for wheezing or shortness of breath.     [provider]  azithromycin (ZITHROMAX) 250 MG tablet Take 1 tablet (250 mg total) by mouth daily. Take first 2 tablets together, then 1 every day until finished. Patient not taking: Reported on 04/05/2023 09/19/21   Milagros Loll, MD  Blood Glucose Monitoring Suppl [facility] VERIO) w/Device KIT Use as advised Patient not taking: Reported on 08/02/2023 08/04/18   Carlus Pavlov, MD  Blood Pressure Monitoring KIT Monitor blood pressure daily secondary to medications taking. 11/14/18   Chilton Si, MD  budesonide-formoterol [facility]) 160-4.5 MCG/ACT inhaler Inhale 2 puffs  into the lungs 2 (two) times daily. 01/25/16   de Dios, Willow A, MD  Cholecalciferol (D3 VITAMIN PO) Take by mouth daily.    [provider]  clindamycin (CLEOCIN) 150 MG capsule Take 3 capsules (450 mg total) by mouth 3 (three) times daily. Patient not taking: Reported on 04/05/2023 10/12/21   Teressa Lower, PA-C  Continuous Glucose Sensor (FREESTYLE LIBRE 3 SENSOR) MISC 1 each by Does not apply route every 14 (fourteen) days. 04/05/23   Carlus Pavlov, MD  ergocalciferol (VITAMIN D2) 1.25 MG (50000 UT) capsule Take 1 capsule (50,000 Units total) by mouth once a week. 12/26/18   Carlus Pavlov, MD  glucose blood test strip Use as instructed 3x a day - Accu-chek Guide 01/31/23   Carlus Pavlov, MD  insulin lispro (HUMALOG KWIKPEN) 200 UNIT/ML KwikPen INJECT 25-30 UNITS UNDER THE SKIN THREE TIMES DAILY BEFORE MEALS Patient taking differently: Inject 10 Units into the skin daily. INJECT 10UNITS UNDER THE SKIN DAILY 11/10/20   Carlus Pavlov, MD  Insulin NPH, Human,, Isophane, (HUMULIN N KWIKPEN) 100 UNIT/ML Kiwkpen Inject 12 Units into the skin 2 (two) times daily. INJECT 24 UNITS UNDER THE SKIN TWICE DAILY 08/02/23   Carlus Pavlov, MD  Insulin Pen Needle (BD PEN NEEDLE NANO 2ND GEN) 32G X 4 MM MISC Use to inject insulin - 5x a day 11/10/20   Carlus Pavlov, MD  Lancet Devices (ONE TOUCH DELICA LANCING DEV) MISC Use to check sugar 2 times daily. 08/04/18   Carlus Pavlov, MD  linaclotide Karlene Einstein) 145 MCG CAPS  capsule Take 145 mcg by mouth daily before breakfast.    [provider]  losartan (COZAAR) 50 MG tablet Take 50 mg by mouth daily. Patient not taking: Reported on 08/02/2023    [provider]  magnesium gluconate (MAGONATE) 30 MG tablet Take 30 mg by mouth 2 (two) times daily. Patient not taking: Reported on 08/02/2023    [provider]  methocarbamol (ROBAXIN) 500 MG tablet Take 1 tablet (500 mg total) by mouth 2 (two) times  daily. Patient not taking: Reported on 08/02/2023 11/07/18   Elson Areas, PA-C  NONFORMULARY OR COMPOUNDED ITEM Glucose and cinnamon Patient not taking: Reported on 08/02/2023    [provider]  nortriptyline (PAMELOR) 10 MG capsule Take 1 capsule at bedtime for 7 days, then increase to 2 capsules at bedtime. Patient not taking: Reported on 08/02/2023 03/06/19   [name], DO  omeprazole (PRILOSEC) 20 MG capsule Take 1 capsule by mouth daily. 12/08/15   [provider]  pyridOXINE (VITAMIN B-6) 100 MG tablet Take 100 mg by mouth daily. Patient not taking: Reported on 08/02/2023    [provider]  rosuvastatin (CRESTOR) 10 MG tablet Take 1 tablet (10 mg total) by mouth daily. Patient not taking: Reported on 08/02/2023 06/19/21   Carlus Pavlov, MD  tirzepatide [facility]) 5 MG/0.5ML Pen Inject 5 mg into the skin once a week. 05/28/23   Carlus Pavlov, MD      Allergies    Liraglutide, Codeine, Acetaminophen, Ibuprofen, Amoxicillin, Metformin, and Penicillins    Review of Systems   Review of Systems  Physical Exam Updated Vital Signs BP (!) 157/99   Pulse (!) 102   Temp 98.3 F (36.8 C) (Oral)   Resp 15   SpO2 100%  Physical Exam Vitals and nursing note reviewed.  Constitutional:      Appearance: She is well-developed.  HENT:     Head: Normocephalic and atraumatic.     Jaw: No trismus.     Right Ear: Tympanic membrane and external ear normal.     Left Ear: Tympanic membrane, ear canal and external ear normal.     Ears:     Comments: Right ear canal appears minimally edematous and inflamed, however the TM appears normal without erythema or bulging    Nose: Congestion present. No mucosal edema or rhinorrhea.     Mouth/Throat:     Mouth: Mucous membranes are moist. Mucous membranes are not dry. No oral lesions.     Pharynx: Uvula midline. No oropharyngeal exudate, posterior oropharyngeal erythema or uvula swelling.     Tonsils: No tonsillar  abscesses.  Eyes:     General:        Right eye: No discharge.        Left eye: No discharge.     Conjunctiva/sclera: Conjunctivae normal.  Cardiovascular:     Rate and Rhythm: Regular rhythm. Tachycardia present.     Heart sounds: Normal heart sounds.  Pulmonary:     Effort: Pulmonary effort is normal. No respiratory distress.     Breath sounds: Normal breath sounds. No wheezing, rhonchi or rales.     Comments: Lungs clear to auscultation bilaterally Abdominal:     Palpations: Abdomen is soft.     Tenderness: There is no abdominal tenderness.  Musculoskeletal:     Cervical back: Normal range of motion and neck supple.  Lymphadenopathy:     Cervical: No cervical adenopathy.  Skin:    General: Skin is warm and dry.  Neurological:     Mental Status: She is alert.  Psychiatric:        Mood and Affect: Mood normal.     ED Results / Procedures / Treatments   Labs (all labs ordered are listed, but only abnormal results are displayed) Labs Reviewed  CBG MONITORING, ED - Abnormal; Notable for the following components:      Result Value   Glucose-Capillary 144 (*)    All other components within normal limits  RESP PANEL BY RT-PCR (RSV, FLU A&B, COVID)  [id number]    ED ECG REPORT   Date: 08/25/2023  Rate: 92  Rhythm: normal sinus rhythm  QRS Axis: normal  Intervals: normal  ST/T Wave abnormalities: nonspecific T wave changes  Conduction Disutrbances:none  Narrative Interpretation:   Old EKG Reviewed: unchanged from past several years  I have personally reviewed the EKG tracing and agree with the computerized printout as noted.   Radiology DG Chest 2 View  Result Date: 08/25/2023 CLINICAL DATA:  42 year old female with history of shortness of breath. EXAM: CHEST - 2 VIEW COMPARISON:  Chest x-ray 09/19/2021. FINDINGS: Lung volumes are normal. No consolidative airspace disease. No pleural effusions. No pneumothorax. No pulmonary nodule or mass noted. Pulmonary vasculature  and the cardiomediastinal silhouette are within normal limits. IMPRESSION: No radiographic evidence of acute cardiopulmonary disease. Electronically Signed   By: Trudie Reed M.D.   On: 08/25/2023 10:12    Procedures Procedures    Medications Ordered in ED Medications  albuterol (VENTOLIN HFA) 108 (90 Base) MCG/ACT inhaler 2 puff (has no administration in time range)    ED Course/ Medical Decision Making/ A&P    Patient seen and examined. History obtained directly from patient.   Labs/EKG: Ordered CBG, viral panel  Imaging: Ordered chest x-ray.  Medications/Fluids: Ordered: None ordered.   Most recent vital signs reviewed and are as follows: BP (!) 157/99   Pulse (!) 102   Temp 98.3 F (36.8 C) (Oral)   Resp 15   SpO2 100%   Initial impression: Upper respiratory infection with mild exacerbation of asthma  10:48 AM Reassessment performed. Patient appears comfortable, reading.  Labs personally reviewed and interpreted including: Negative flu, COVID, RSV.  Minimally elevated blood sugar 140s.  Imaging personally visualized and interpreted including: Chest x-ray, agree negative.  Reviewed pertinent lab work and imaging with patient at bedside. Questions answered.   Most current vital signs reviewed and are as follows: BP (!) 157/99   Pulse (!) 102   Temp 98.3 F (36.8 C) (Oral)   Resp 15   SpO2 100%   Plan: Discharge to home.   Prescriptions written for: Prednisone  Other home care instructions discussed: OTC meds as needed for symptom control  ED return instructions discussed: Worsening shortness of breath, trouble breathing  Follow-up instructions discussed: Patient encouraged to follow-up with their PCP in 5 days if not improving.                                Medical Decision Making Amount and/or Complexity of Data Reviewed Radiology: ordered.  Risk Prescription drug management.   Patient with symptoms consistent with a viral syndrome with  exacerbation of asthma. Vitals are stable, no fever. No signs of dehydration. Lung exam normal, no signs of pneumonia on chest x-ray.  She would likely benefit from 5-day course of prednisone for symptom control.  Blood sugar today is relatively controlled.  Supportive therapy  indicated with return if symptoms worsen.           Final Clinical Impression(s) / ED Diagnoses Final diagnoses:  Respiratory infection  Exacerbation of asthma, unspecified asthma severity, unspecified whether persistent    Rx / DC Orders ED Discharge Orders          Ordered    predniSONE (DELTASONE) 20 MG tablet  Daily        08/25/23 1048              Renne Crigler, PA-C 08/25/23 1050    Gwyneth Sprout, MD 08/28/23 1152

## 2023-08-25 NOTE — ED Notes (Signed)
Discharge paperwork given and verbally understood. 

## 2023-08-25 NOTE — Discharge Instructions (Signed)
Please read and follow all provided instructions.  Your diagnoses today include:  1. Respiratory infection   2. Exacerbation of asthma, unspecified asthma severity, unspecified whether persistent     You appear to have an upper respiratory infection (URI). An upper respiratory tract infection, or cold, is a viral infection of the air passages leading to the lungs. It should improve gradually after 5-7 days. You may have a lingering cough that lasts for 2- 4 weeks after the infection.  Tests performed today include: Vital signs. See below for your results today.  EKG unchanged from previous Flu, COVID, RSV testing was negative Chest x-ray without signs of pneumonia  Medications prescribed:  Prednisone - steroid medicine   It is best to take this medication in the morning to prevent sleeping problems. If you are diabetic, monitor your blood sugar closely and stop taking Prednisone if blood sugar is over 300. Take with food to prevent stomach upset.   Take any prescribed medications only as directed. Treatment for your infection is aimed at treating the symptoms. There are no medications, such as antibiotics, that will cure your infection.   Home care instructions:  You can take Tylenol and/or Ibuprofen as directed on the packaging for fever reduction and pain relief.    For cough: honey 1/2 to 1 teaspoon (you can dilute the honey in water or another fluid).  You can also use guaifenesin and dextromethorphan for cough. You can use a humidifier for chest congestion and cough.  If you don't have a humidifier, you can sit in the bathroom with the hot shower running.      For sore throat: try warm salt water gargles, cepacol lozenges, throat spray, warm tea or water with lemon/honey, popsicles or ice, or OTC cold relief medicine for throat discomfort.    For congestion: take a daily anti-histamine like Zyrtec, Claritin, and a oral decongestant, such as pseudoephedrine.  You can also use Flonase  1-2 sprays in each nostril daily.    It is important to stay hydrated: drink plenty of fluids (water, gatorade/powerade/pedialyte, juices, or teas) to keep your throat moisturized and help further relieve irritation/discomfort.   Your illness is contagious and can be spread to others, especially during the first 3 or 4 days. It cannot be cured by antibiotics or other medicines. Take basic precautions such as washing your hands often, covering your mouth when you cough or sneeze, and avoiding public places where you could spread your illness to others.   Please continue drinking plenty of fluids.  Use over-the-counter medicines as needed as directed on packaging for symptom relief.  You may also use ibuprofen or tylenol as directed on packaging for pain or fever.  Do not take multiple medicines containing Tylenol or acetaminophen to avoid taking too much of this medication.  Follow-up instructions: Please follow-up with your primary care provider in the next 3 days for further evaluation of your symptoms if you are not feeling better.   Return instructions:  Please return to the Emergency Department if you experience worsening symptoms.  RETURN IMMEDIATELY IF you develop shortness of breath, confusion or altered mental status, a new rash, become dizzy, faint, or poorly responsive, or are unable to be cared for at home. Please return if you have persistent vomiting and cannot keep down fluids or develop a fever that is not controlled by tylenol or motrin.   Please return if you have any other emergent concerns.  Additional Information:  Your vital signs today were:  BP (!) 157/99   Pulse (!) 102   Temp 98.3 F (36.8 C) (Oral)   Resp 15   SpO2 100%  If your blood pressure (BP) was elevated above 135/85 this visit, please have this repeated by your doctor within one month. --------------

## 2023-08-25 NOTE — ED Triage Notes (Addendum)
Pt reports wheezing and SOB along with sinus drainage and right side ear pain for 2 weeks.  H/o asthma and has been using inhalers and OTC cold/cough medications with some relief.  Denies fever.  Ambulated to patient room without difficulty. No audible wheezing noted in triage. AAOx4.

## 2023-08-30 ENCOUNTER — Ambulatory Visit: Payer: Medicare PPO | Admitting: Psychology

## 2023-08-30 NOTE — Progress Notes (Unsigned)
                Lisa Cole Lisa Denario Bagot, [name] 

## 2023-10-08 ENCOUNTER — Ambulatory Visit: Payer: Medicare PPO | Admitting: Psychology

## 2023-10-08 DIAGNOSIS — F411 Generalized anxiety disorder: Secondary | ICD-10-CM

## 2023-10-08 DIAGNOSIS — F331 Major depressive disorder, recurrent, moderate: Secondary | ICD-10-CM

## 2023-10-08 DIAGNOSIS — F4310 Post-traumatic stress disorder, unspecified: Secondary | ICD-10-CM
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
## 2023-10-18 ENCOUNTER — Ambulatory Visit: Payer: Medicare PPO | Admitting: Psychology

## 2023-10-25 ENCOUNTER — Ambulatory Visit (INDEPENDENT_AMBULATORY_CARE_PROVIDER_SITE_OTHER): Payer: Medicare PPO | Admitting: Psychology

## 2023-10-25 DIAGNOSIS — F4310 Post-traumatic stress disorder, unspecified: Secondary | ICD-10-CM

## 2023-10-25 DIAGNOSIS — F331 Major depressive disorder, recurrent, moderate: Secondary | ICD-10-CM

## 2023-10-25 DIAGNOSIS — F411 Generalized anxiety disorder: Secondary | ICD-10-CM | POA: Diagnosis not present

## 2023-10-25 NOTE — Progress Notes (Unsigned)
[facility] [facility] Counselor/Therapist Progress Notes  Patient ID: Lisa Cole, MRN: [id number],    Date: 10/25/2023  Time Spent:  60 minutes  Time in:  11:00 Time out:  12:00  Treatment Type: Individual Therapy  Reported Symptoms: worry, depression, physical pain  Mental Status Exam: Appearance:  Casual  Behavior: normal  Motor: normal  Speech/Language:  normal  Affect: blunted  Mood: pleasant  Thought process: WNL  Thought content:   WNL  Sensory/Perceptual disturbances:   WNL  Orientation: oriented to person, place, time/date, and situation  Attention: Good  Concentration: Fair  Memory: WNL  Fund of knowledge:  Good  Insight:   Fair  Judgment:  Poor  Impulse Control: Poor   Risk Assessment: Danger to Self:  No Self-injurious Behavior: No Danger to Others: No Duty to Warn:no Physical Aggression / Violence:No  Access to Firearms a concern: No  Gang Involvement:No   Subjective: The patient attended a face-to-face individual therapy session via video visit on caregility today.  The patient gave verbal consent for the session to be on video on caregility and is aware of the limitations of telehealth.  The patient was in her car alone and the therapist was in the office.  A little bit of difficulty getting on to the session today and also we had to go to the telephone at the end of the session because we were having connectivity problems.  The patient reports that she got a prior approval letter for her to get a house and she is ecstatic about this.  We talked about what she is looking for and talked about trying to make sure she gets herself in a good financial position so that she does not have to worry about money anymore.  She is very happy about being able to look for a house right now and she is thrilled to get out of the neighborhood that she is currently in.  She is going to be moving in with her parents at the end of February so that she can save some  money and also go ahead and shop for a house.  She reports that she feels like she is doing better with being less anxious and less reactive.  We talked about how to maintain her stability and talked about her continuing to take care of herself and balance things out. Interventions: Cognitive Behavioral Therapy and Mindfulness Meditation, problem solving and psychoeducation.  Diagnosis:Major depressive disorder, recurrent episode, moderate (HCC)  Generalized anxiety disorder  PTSD (post-traumatic stress disorder)  Plan: Treatment Plan   Strengths/Abilities:  intelligent, motivated   Treatment Preferences: Outpatient Individual therapy  Statement of Needs:  "I have anxiety and depression"  Symptoms:  Autonomic hyperactivity (e.g., palpitations, shortness of breath, dry mouth, trouble swallowing,  nausea, diarrhea).: (Status: improved). Excessive and/or unrealistic worry that  is difficult to control occurring more days than not for at least 6 months about a number of events or  activities.: (Status: improved). Hypervigilance (e.g., feeling constantly on  edge, experiencing concentration difficulties, having trouble falling or staying asleep, exhibiting a  general state of irritability).: (Status: improved). Motor tension (e.g.,  restlessness, tiredness, shakiness, muscle tension).: (Status: improved).  Problems Addressed:  Depression and Anxiety  Goals:  LTG:1. Enhance ability to effectively cope with the full variety of life's worries  and anxieties.  60% 2. Learn and implement coping skills that result in a reduction of anxiety  and worry, and improved daily functioning  60% 3. Reduce overall  frequency, intensity, and duration of the anxiety so that  daily functioning is not impaired.  50% 4. Resolve the core conflict that is the source of anxiety.  10% 5. Stabilize anxiety level while increasing ability to function on a daily  20% STG: 1. Identify, challenge, and replace biased,  fearful self-talk with positive, realistic, and empowering self talk  50% 2. Learn and implement problem-solving strategies for realistically addressing worries.  50%   3.Learn and implement calming skills to reduce overall anxiety and manage anxiety symptoms.  50%   Target Date:  10/13/2024 Frequency:  weekly to biweekly Modality: Individual therapy Interventions by Therapist: CBT, problem solving, EMDR, insight oriented and mindfulness meditation  Patient has approved this treatment plan  Avilyn Virtue G Cailin Gebel, [name]

## 2023-10-29 ENCOUNTER — Ambulatory Visit (INDEPENDENT_AMBULATORY_CARE_PROVIDER_SITE_OTHER): Payer: Medicare Other | Admitting: Psychology

## 2023-10-29 DIAGNOSIS — F411 Generalized anxiety disorder: Secondary | ICD-10-CM

## 2023-10-29 DIAGNOSIS — F4310 Post-traumatic stress disorder, unspecified: Secondary | ICD-10-CM

## 2023-10-29 DIAGNOSIS — F331 Major depressive disorder, recurrent, moderate: Secondary | ICD-10-CM | POA: Diagnosis not present

## 2023-10-29 NOTE — Progress Notes (Signed)
 [facility] Counselor/Therapist Progress Notes  Patient ID: Taraji NEZZIE MANERA, MRN: [id number],    Date: 10/29/2023  Time Spent:  55 minutes  Time in: 2:05 Time out:  3:00  Treatment Type: Individual Therapy  Reported Symptoms: worry, depression, physical pain  Mental Status Exam: Appearance:  Casual  Behavior: normal  Motor: normal  Speech/Language:  normal  Affect: blunted  Mood: pleasant  Thought process: WNL  Thought content:   WNL  Sensory/Perceptual disturbances:   WNL  Orientation: oriented to person, place, time/date, and situation  Attention: Good  Concentration: Fair  Memory: WNL  Fund of knowledge:  Good  Insight:   Fair  Judgment:  Poor  Impulse Control: Poor   Risk Assessment: Danger to Self:  No Self-injurious Behavior: No Danger to Others: No Duty to Warn:no Physical Aggression / Violence:No  Access to Firearms a concern: No  Gang Involvement:No   Subjective: The patient attended a face-to-face individual therapy session via video visit on caregility today.  The patient gave verbal consent for the session to be on video on caregility and is aware of the limitations of telehealth.  The patient was in her car alone and the therapist was in the office.  Did have a little difficulty with the video and had to log out my back and several times and ended up finishing the session on the phone.  The patient  presents as pleasant and cooperative.  The patient states that things seem to be going okay right now.  She is very excited about the opportunity to look at houses and pick a house that she can afford and live in.  I expressed a little concern about her looking at the higher end of the price range that she is approved for just because she has had a history sometimes of having difficulty making payments on things.  The patient is aware that she needs to be mindful about not being house poor so she can do other things.  We talked about the process of buying  a house and she struggles sometimes with understanding what the process is going to look like as she has never purchased a home before.  We talked about where she can find information and did some problem solving around this.  She seems to be managing her moods a little better now and we will continue to work with her on making good decisions and being able to manage her emotions so that she can make good decisions about how she is going to move forward.    Interventions: Cognitive Behavioral Therapy and Mindfulness Meditation, problem solving and psychoeducation.  Diagnosis:Major depressive disorder, recurrent episode, moderate (HCC)  Generalized anxiety disorder  PTSD (post-traumatic stress disorder)  Plan: Treatment Plan   Strengths/Abilities:  intelligent, motivated   Treatment Preferences: Outpatient Individual therapy  Statement of Needs:  I have anxiety and depression  Symptoms:  Autonomic hyperactivity (e.g., palpitations, shortness of breath, dry mouth, trouble swallowing,  nausea, diarrhea).: (Status: improved). Excessive and/or unrealistic worry that  is difficult to control occurring more days than not for at least 6 months about a number of events or  activities.: (Status: improved). Hypervigilance (e.g., feeling constantly on  edge, experiencing concentration difficulties, having trouble falling or staying asleep, exhibiting a  general state of irritability).: (Status: improved). Motor tension (e.g.,  restlessness, tiredness, shakiness, muscle tension).: (Status: improved).  Problems Addressed:  Depression and Anxiety  Goals:  LTG:1. Enhance ability to effectively cope with the full variety of  life's worries  and anxieties.  60% 2. Learn and implement coping skills that result in a reduction of anxiety  and worry, and improved daily functioning  60% 3. Reduce overall frequency, intensity, and duration of the anxiety so that  daily functioning is not impaired.   50% 4. Resolve the core conflict that is the source of anxiety.  10% 5. Stabilize anxiety level while increasing ability to function on a daily  20% STG: 1. Identify, challenge, and replace biased, fearful self-talk with positive, realistic, and empowering self talk  50% 2. Learn and implement problem-solving strategies for realistically addressing worries.  50%   3.Learn and implement calming skills to reduce overall anxiety and manage anxiety symptoms.  50%   Target Date:  10/13/2024 Frequency:  weekly to biweekly Modality: Individual therapy Interventions by Therapist: CBT, problem solving, EMDR, insight oriented and mindfulness meditation  Patient has approved this treatment plan  Pao Haffey G Luv Mish, [name]

## 2023-11-05 ENCOUNTER — Ambulatory Visit: Payer: Medicare Other | Admitting: Psychology

## 2023-11-15 ENCOUNTER — Ambulatory Visit: Payer: Medicare Other | Admitting: Psychology

## 2023-11-15 DIAGNOSIS — F331 Major depressive disorder, recurrent, moderate: Secondary | ICD-10-CM

## 2023-11-15 DIAGNOSIS — F4310 Post-traumatic stress disorder, unspecified: Secondary | ICD-10-CM

## 2023-11-15 DIAGNOSIS — F411 Generalized anxiety disorder: Secondary | ICD-10-CM

## 2023-11-15 NOTE — Progress Notes (Signed)
[facility] Counselor/Therapist Progress Notes  Patient ID: Inesha CHELSIA SERRES, MRN: [id number],    Date: 11/15/2023  Time Spent:  55 minutes  Time in: 9:05 Time out:  10:00  Treatment Type: Individual Therapy  Reported Symptoms: worry, depression, physical pain  Mental Status Exam: Appearance:  Casual  Behavior: normal  Motor: normal  Speech/Language:  normal  Affect: blunted  Mood: pleasant  Thought process: WNL  Thought content:   WNL  Sensory/Perceptual disturbances:   WNL  Orientation: oriented to person, place, time/date, and situation  Attention: Good  Concentration: Fair  Memory: WNL  Fund of knowledge:  Good  Insight:   Fair  Judgment:  Poor  Impulse Control: Poor   Risk Assessment: Danger to Self:  No Self-injurious Behavior: No Danger to Others: No Duty to Warn:no Physical Aggression / Violence:No  Access to Firearms a concern: No  Gang Involvement:No   Subjective: The patient attended a face-to-face individual therapy session via video visit  today.  The patient gave verbal consent for the session to be on video on caregility and is aware of the limitations of telehealth.  The patient was in her home alone and the therapist was in the office.  We ended up having to do the session on the phone because the patient did not not have access to a computer where she could log on.  The patient was pleasant and cooperative but reported that she was sick and not feeling well today.  I ask her if she wanted to complete the session and she said she did so we kept the session on the phone.  We talked about the things that she has going on in her life and it seems that she is in the middle of several stressors.  She is moving in with her parents and is moving out of the house that she was renting.  She is also in the process of trying to find out how she can buy and has not been able to find anything just yet.  She is also having a lot of trouble with her son and he  is either having hallucinations or pretending to have hallucinations and this is causing her a great deal of stress.  Provided supportive therapy for her today and talked about getting to a different place as far as getting herself moved into with her parents.   Interventions: Cognitive Behavioral Therapy and Mindfulness Meditation, problem solving and psychoeducation.  Diagnosis:Major depressive disorder, recurrent episode, moderate (HCC)  Generalized anxiety disorder  PTSD (post-traumatic stress disorder)  Plan: Treatment Plan   Strengths/Abilities:  intelligent, motivated   Treatment Preferences: Outpatient Individual therapy  Statement of Needs:  "I have anxiety and depression"  Symptoms:  Autonomic hyperactivity (e.g., palpitations, shortness of breath, dry mouth, trouble swallowing,  nausea, diarrhea).: (Status: improved). Excessive and/or unrealistic worry that  is difficult to control occurring more days than not for at least 6 months about a number of events or  activities.: (Status: improved). Hypervigilance (e.g., feeling constantly on  edge, experiencing concentration difficulties, having trouble falling or staying asleep, exhibiting a  general state of irritability).: (Status: improved). Motor tension (e.g.,  restlessness, tiredness, shakiness, muscle tension).: (Status: improved).  Problems Addressed:  Depression and Anxiety  Goals:  LTG:1. Enhance ability to effectively cope with the full variety of life's worries  and anxieties.  60% 2. Learn and implement coping skills that result in a reduction of anxiety  and worry, and improved daily functioning  60%  3. Reduce overall frequency, intensity, and duration of the anxiety so that  daily functioning is not impaired.  50% 4. Resolve the core conflict that is the source of anxiety.  10% 5. Stabilize anxiety level while increasing ability to function on a daily  20% STG: 1. Identify, challenge, and replace biased,  fearful self-talk with positive, realistic, and empowering self talk  50% 2. Learn and implement problem-solving strategies for realistically addressing worries.  50%   3.Learn and implement calming skills to reduce overall anxiety and manage anxiety symptoms.  50%   Target Date:  10/13/2024 Frequency:  weekly to biweekly Modality: Individual therapy Interventions by Therapist: CBT, problem solving, EMDR, insight oriented and mindfulness meditation  Patient has approved this treatment plan  Jennica Tagliaferri G Amar Sippel, [name]

## 2023-11-18 ENCOUNTER — Other Ambulatory Visit: Payer: Self-pay

## 2023-11-18 ENCOUNTER — Emergency Department (HOSPITAL_BASED_OUTPATIENT_CLINIC_OR_DEPARTMENT_OTHER): Payer: 59

## 2023-11-18 ENCOUNTER — Encounter: Payer: Self-pay | Admitting: Internal Medicine

## 2023-11-18 ENCOUNTER — Other Ambulatory Visit (HOSPITAL_BASED_OUTPATIENT_CLINIC_OR_DEPARTMENT_OTHER): Payer: Self-pay

## 2023-11-18 ENCOUNTER — Encounter: Payer: Self-pay | Admitting: Emergency Medicine

## 2023-11-18 ENCOUNTER — Encounter (HOSPITAL_BASED_OUTPATIENT_CLINIC_OR_DEPARTMENT_OTHER): Payer: Self-pay

## 2023-11-18 ENCOUNTER — Emergency Department (HOSPITAL_BASED_OUTPATIENT_CLINIC_OR_DEPARTMENT_OTHER)
Admission: EM | Admit: 2023-11-18 | Discharge: 2023-11-18 | Disposition: A | Discharge status: Home / Self Care | Payer: 59 | Attending: Emergency Medicine | Admitting: Emergency Medicine

## 2023-11-18 ENCOUNTER — Ambulatory Visit
Admission: EM | Admit: 2023-11-18 | Discharge: 2023-11-18 | Disposition: A | Discharge status: Home / Self Care | Payer: Medicare Other | Attending: Internal Medicine | Admitting: Internal Medicine

## 2023-11-18 DIAGNOSIS — R062 Wheezing: Secondary | ICD-10-CM | POA: Diagnosis present

## 2023-11-18 DIAGNOSIS — R682 Dry mouth, unspecified: Secondary | ICD-10-CM

## 2023-11-18 DIAGNOSIS — E1165 Type 2 diabetes mellitus with hyperglycemia: Secondary | ICD-10-CM | POA: Diagnosis not present

## 2023-11-18 DIAGNOSIS — J210 Acute bronchiolitis due to respiratory syncytial virus: Secondary | ICD-10-CM | POA: Diagnosis not present

## 2023-11-18 DIAGNOSIS — R739 Hyperglycemia, unspecified: Secondary | ICD-10-CM | POA: Insufficient documentation

## 2023-11-18 DIAGNOSIS — Z794 Long term (current) use of insulin: Secondary | ICD-10-CM

## 2023-11-18 DIAGNOSIS — R42 Dizziness and giddiness: Secondary | ICD-10-CM | POA: Diagnosis not present

## 2023-11-18 LAB — CBC WITH DIFFERENTIAL/PLATELET
Abs Immature Granulocytes: 0.02 10*3/uL (ref 0.00–0.07)
Basophils Absolute: 0.1 10*3/uL (ref 0.0–0.1)
Basophils Relative: 1 %
Eosinophils Absolute: 0.2 10*3/uL (ref 0.0–0.5)
Eosinophils Relative: 4 %
HCT: 39.2 % (ref 36.0–46.0)
Hemoglobin: 13.1 g/dL (ref 12.0–15.0)
Immature Granulocytes: 0 %
Lymphocytes Relative: 48 %
Lymphs Abs: 3.2 10*3/uL (ref 0.7–4.0)
MCH: 30 pg (ref 26.0–34.0)
MCHC: 33.4 g/dL (ref 30.0–36.0)
MCV: 89.9 fL (ref 80.0–100.0)
Monocytes Absolute: 0.5 10*3/uL (ref 0.1–1.0)
Monocytes Relative: 8 %
Neutro Abs: 2.6 10*3/uL (ref 1.7–7.7)
Neutrophils Relative %: 39 %
Platelets: 250 10*3/uL (ref 150–400)
RBC: 4.36 MIL/uL (ref 3.87–5.11)
RDW: 12.9 % (ref 11.5–15.5)
WBC: 6.5 10*3/uL (ref 4.0–10.5)
nRBC: 0 % (ref 0.0–0.2)

## 2023-11-18 LAB — COMPREHENSIVE METABOLIC PANEL
ALT: 26 U/L (ref 0–44)
AST: 38 U/L (ref 15–41)
Albumin: 4.1 g/dL (ref 3.5–5.0)
Alkaline Phosphatase: 47 U/L (ref 38–126)
Anion gap: 8 (ref 5–15)
BUN: 10 mg/dL (ref 6–20)
CO2: 25 mmol/L (ref 22–32)
Calcium: 9.8 mg/dL (ref 8.9–10.3)
Chloride: 106 mmol/L (ref 98–111)
Creatinine, Ser: 0.65 mg/dL (ref 0.44–1.00)
GFR, Estimated: >60 mL/min (ref >60)
Glucose, Bld: 144 mg/dL — ABNORMAL HIGH (ref 70–99)
Potassium: 3.8 mmol/L (ref 3.5–5.1)
Sodium: 139 mmol/L (ref 135–145)
Total Bilirubin: 0.6 mg/dL (ref 0.0–1.2)
Total Protein: 8 g/dL (ref 6.5–8.1)

## 2023-11-18 LAB — I-STAT VENOUS BLOOD GAS, ED
Acid-Base Excess: 0 mmol/L (ref 0.0–2.0)
Bicarbonate: 25.9 mmol/L (ref 20.0–28.0)
Calcium, Ion: 1.3 mmol/L (ref 1.15–1.40)
HCT: 41 % (ref 36.0–46.0)
Hemoglobin: 13.9 g/dL (ref 12.0–15.0)
O2 Saturation: 50 %
Patient temperature: 98.6
Potassium: 4 mmol/L (ref 3.5–5.1)
Sodium: 142 mmol/L (ref 135–145)
TCO2: 27 mmol/L (ref 22–32)
pCO2, Ven: 47.1 mm[Hg] (ref 44–60)
pH, Ven: 7.348 (ref 7.25–7.43)
pO2, Ven: 29 mm[Hg] — CL (ref 32–45)

## 2023-11-18 LAB — POCT URINALYSIS DIP (MANUAL ENTRY)
Bilirubin, UA: NEGATIVE
Blood, UA: NEGATIVE
Glucose, UA: 500 mg/dL — AB
Ketones, POC UA: NEGATIVE mg/dL
Leukocytes, UA: NEGATIVE
Nitrite, UA: NEGATIVE
Protein Ur, POC: NEGATIVE mg/dL
Spec Grav, UA: 1.025 (ref 1.010–1.025)
Urobilinogen, UA: 0.2 U/dL
pH, UA: 5 (ref 5.0–8.0)

## 2023-11-18 LAB — RESP PANEL BY RT-PCR (RSV, FLU A&B, COVID)  RVPGX2
Influenza A by PCR: NEGATIVE
Influenza B by PCR: NEGATIVE
Resp Syncytial Virus by PCR: POSITIVE — AB
SARS Coronavirus 2 by RT PCR: NEGATIVE

## 2023-11-18 LAB — TROPONIN I (HIGH SENSITIVITY): Troponin I (High Sensitivity): 4 ng/L (ref <18)

## 2023-11-18 LAB — CBG MONITORING, ED: Glucose-Capillary: 167 mg/dL — ABNORMAL HIGH (ref 70–99)

## 2023-11-18 LAB — POCT FASTING CBG KUC MANUAL ENTRY: POCT Glucose (KUC): 189 mg/dL — AB (ref 70–99)

## 2023-11-18 LAB — LIPASE, BLOOD: Lipase: 33 U/L (ref 11–51)

## 2023-11-18 MED ORDER — SODIUM CHLORIDE 0.9 % IV BOLUS
1000.0000 mL | Freq: Once | INTRAVENOUS | Status: AC
  Administered 2023-11-18: 1000 mL via INTRAVENOUS

## 2023-11-18 MED ORDER — BENZONATATE 100 MG PO CAPS
100.0000 mg | ORAL_CAPSULE | Freq: Three times a day (TID) | ORAL | 0 refills | Status: DC | PRN
  Filled 2023-11-18: qty 20, 7d supply, fill #0

## 2023-11-18 NOTE — Discharge Instructions (Addendum)
 Follow up on your X Ray results and review them with your doctor. Take tessalon perles per prescriptions for coughing as needed.  Follow up with your endocrinologist as soon as possible. At this time your blood sugar is 144. Return to the Emergency Department if you have worsening or concerning symptoms.

## 2023-11-18 NOTE — ED Provider Notes (Signed)
 [facility]    CSN: [id number] Arrival date & time: 11/18/23  1016      History   Chief Complaint Chief Complaint  Patient presents with   Cough    HPI Lisa Cole is a 43 y.o. female.   Lisa [facility] is a 43 y.o. female presenting for chief complaint of Cough and congestion that started 3 days ago. History of asthma, states she feels she has been wheezing.   She mentions to the nurse that her blood sugars have been in the 300s-400s. History of type 2 diabetes, takes insulin. She was taking Mounjaro for weight loss and diabetes management until 4 weeks ago when she stopped taking the medication due to experiencing "sulfur burps" side effect. History of GERD and PUD at baseline. She felt the [facility] was causing GERD flare so she gave herself 1/2 dose 2 weeks ago and has not used the medication since. She has continued using long and short acting insulin. Last A1C in November 2024 was 7.9. Reports persistent dry mouth no matter how well hydrated she stays with water for the last 1-2 months as well as intermittent blurry vision and dizziness. Additionally reports nausea without vomiting or abdominal pain. Fasting CBG this morning at 7am 295. She gave herself 20 units of one of her insulin types and 25 units of the other type at 7am but she cannot remember which one was which (long or short acting). Denies urinary symptoms, abdominal pain, flank pain, fever, chills.    Cough   Past Medical History:  Diagnosis Date   Anemia    Anxiety disorder    Arthritis    Asthma    Atypical chest pain 04/10/2018   BV (bacterial vaginosis)    Chronic pelvic pain in female    Diabetes mellitus    Endometriosis    Hashimoto thyroiditis    Hepatic steatosis    Hypertension    IBS (irritable bowel syndrome)    IC (interstitial cystitis)    Inappropriate sinus tachycardia (HCC) 06/25/2018   Obesity    OSA (obstructive sleep apnea)    SVT (supraventricular tachycardia) (HCC)     Type 2 diabetes mellitus (HCC)     Patient Active Problem List   Diagnosis Date Noted   Vitamin D deficiency 12/26/2018   Disorder of brain, unspecified 12/23/2018   Chronic post-traumatic headache, intractable 12/02/2018   Acute posttraumatic headache 11/25/2018   Concussion with loss of consciousness 11/25/2018   Cubital tunnel syndrome 11/13/2018   Pain in right hand 08/05/2018   Arthralgia of right elbow 07/14/2018   Inappropriate sinus tachycardia (HCC) 06/25/2018   Ulnar neuropathy of right upper extremity 04/11/2018   Atypical chest pain 04/10/2018   Carpal tunnel syndrome, right 03/19/2018   Right wrist tendinitis 03/19/2018   OSA (obstructive sleep apnea) 01/25/2016   Asthma 01/25/2016   GERD (gastroesophageal reflux disease) 01/25/2016   H/O thyroid nodule 11/30/2015   Type 2 diabetes mellitus with hyperglycemia, with long-term current use of insulin (HCC) 11/30/2015   Primary osteoarthritis of right knee 08/05/2015   Increased frequency of urination 02/04/2013   Mixed urge and stress incontinence 02/04/2013   Fibromyalgia 02/04/2013   Knee pain 10/28/2012   Arthritis 03/28/2012   Essential hypertension 03/28/2012   Gout, unspecified 03/28/2012   Heart disease, unspecified 03/28/2012   History of asthma 03/28/2012   Interstitial cystitis (chronic) without hematuria 03/28/2012   Abnormal blood chemistry 03/09/2012   Chronic pain 03/09/2012   Sinus tachycardia 03/09/2012  Dysphonia 01/28/2012   Reflux 01/28/2012   Thyroid nodule 05/17/2011   Cough 03/02/2011   CONSTIPATION, CHRONIC 12/07/2010   IRRITABLE BOWEL SYNDROME 12/07/2010   OTHER DYSPHAGIA 12/07/2010   ABDOMINAL PAIN OTHER SPECIFIED SITE 12/07/2010   HEADACHE 07/17/2010   Headache 07/17/2010   HASHIMOTO'S THYROIDITIS 02/16/2010   ADHD 02/16/2010   Attention deficit disorder with hyperactivity 02/16/2010   Anxiety state 05/17/2009   Hypothyroidism 04/05/2009   FIBROCYSTIC BREAST DISEASE  03/11/2009   OBESITY, UNSPECIFIED 12/27/2008   FATTY LIVER DISEASE 07/20/2008   IRON DEFICIENCY ANEMIA SECONDARY TO BLOOD LOSS 07/08/2008   ENDOMETRIOSIS 07/08/2008    Past Surgical History:  Procedure Laterality Date   bladder staple     CYSTOSTOMY W/ BLADDER BIOPSY     SHOULDER SURGERY  2008   right   TOTAL ABDOMINAL HYSTERECTOMY  2004   TUBAL LIGATION  2001   WRIST SURGERY  2008   right    OB History     Gravida  4   Para  3   Term  3   Preterm  0   AB  1   Living  3      SAB  1   IAB  0   Ectopic  0   Multiple  0   Live Births               Home Medications    Prior to Admission medications   Medication Sig Start Date End Date Taking? Authorizing Provider  albuterol (PROVENTIL HFA;VENTOLIN HFA) 108 (90 Base) MCG/ACT inhaler Inhale 2 puffs into the lungs every 4 (four) hours as needed for wheezing or shortness of breath.     [provider]  azithromycin (ZITHROMAX) 250 MG tablet Take 1 tablet (250 mg total) by mouth daily. Take first 2 tablets together, then 1 every day until finished. Patient not taking: Reported on 04/05/2023 09/19/21   Milagros Loll, MD  Blood Glucose Monitoring Suppl [facility] VERIO) w/Device KIT Use as advised Patient not taking: Reported on 08/02/2023 08/04/18   Carlus Pavlov, MD  Blood Pressure Monitoring KIT Monitor blood pressure daily secondary to medications taking. 11/14/18   Chilton Si, MD  budesonide-formoterol [facility]) 160-4.5 MCG/ACT inhaler Inhale 2 puffs into the lungs 2 (two) times daily. 01/25/16   de Dios, [place] A, MD  Cholecalciferol (D3 VITAMIN PO) Take by mouth daily.    [provider]  clindamycin (CLEOCIN) 150 MG capsule Take 3 capsules (450 mg total) by mouth 3 (three) times daily. Patient not taking: Reported on 04/05/2023 10/12/21   Teressa Lower, PA-C  Continuous Glucose Sensor (FREESTYLE LIBRE 3 SENSOR) MISC 1 each by Does not apply route every 14 (fourteen)  days. 04/05/23   Carlus Pavlov, MD  ergocalciferol (VITAMIN D2) 1.25 MG (50000 UT) capsule Take 1 capsule (50,000 Units total) by mouth once a week. 12/26/18   Carlus Pavlov, MD  glucose blood test strip Use as instructed 3x a day - Accu-chek Guide 01/31/23   Carlus Pavlov, MD  insulin lispro (HUMALOG KWIKPEN) 200 UNIT/ML KwikPen INJECT 25-30 UNITS UNDER THE SKIN THREE TIMES DAILY BEFORE MEALS Patient taking differently: Inject 10 Units into the skin daily. INJECT 10UNITS UNDER THE SKIN DAILY 11/10/20   Carlus Pavlov, MD  Insulin NPH, Human,, Isophane, (HUMULIN N KWIKPEN) 100 UNIT/ML Kiwkpen Inject 12 Units into the skin 2 (two) times daily. INJECT 24 UNITS UNDER THE SKIN TWICE DAILY 08/02/23   Carlus Pavlov, MD  Insulin Pen Needle (BD  PEN NEEDLE NANO 2ND GEN) 32G X 4 MM MISC Use to inject insulin - 5x a day 11/10/20   Carlus Pavlov, MD  Lancet Devices (ONE TOUCH DELICA LANCING DEV) MISC Use to check sugar 2 times daily. 08/04/18   Carlus Pavlov, MD  linaclotide (LINZESS) 145 MCG CAPS capsule Take 145 mcg by mouth daily before breakfast.    [provider]  losartan (COZAAR) 50 MG tablet Take 50 mg by mouth daily. Patient not taking: Reported on 08/02/2023    [provider]  magnesium gluconate (MAGONATE) 30 MG tablet Take 30 mg by mouth 2 (two) times daily. Patient not taking: Reported on 08/02/2023    [provider]  methocarbamol (ROBAXIN) 500 MG tablet Take 1 tablet (500 mg total) by mouth 2 (two) times daily. Patient not taking: Reported on 08/02/2023 11/07/18   Elson Areas, PA-C  NONFORMULARY OR COMPOUNDED ITEM Glucose and cinnamon Patient not taking: Reported on 08/02/2023    [provider]  nortriptyline (PAMELOR) 10 MG capsule Take 1 capsule at bedtime for 7 days, then increase to 2 capsules at bedtime. Patient not taking: Reported on 08/02/2023 03/06/19   [name], DO  omeprazole (PRILOSEC) 20 MG capsule Take 1 capsule by mouth  daily. 12/08/15   [provider]  predniSONE (DELTASONE) 20 MG tablet Take 2 tablets (40 mg total) by mouth daily. 08/25/23   Renne Crigler, PA-C  pyridOXINE (VITAMIN B-6) 100 MG tablet Take 100 mg by mouth daily. Patient not taking: Reported on 08/02/2023    [provider]  rosuvastatin (CRESTOR) 10 MG tablet Take 1 tablet (10 mg total) by mouth daily. Patient not taking: Reported on 08/02/2023 06/19/21   Carlus Pavlov, MD  tirzepatide [facility]) 5 MG/0.5ML Pen Inject 5 mg into the skin once a week. 05/28/23   Carlus Pavlov, MD    Family History Family History  Problem Relation Age of Onset   Diabetes Mother    Hypertension Father    Heart attack Father    Diabetes Father    Alcohol abuse Other    Hypertension Other    Hyperlipidemia Other    Kidney disease Other    Colon cancer Other        uncle   Prostate cancer Other        uncles and grandfather   Cirrhosis Other        both grandmother and grandfather   Lung cancer Other    Heart disease Other    Thyroid disease Other    Colon polyps Other    Liver disease Other    Arthritis Maternal Grandmother    Heart failure Maternal Grandmother    Kidney failure Maternal Grandmother    Diabetes Daughter        type 2   Hypertension Daughter    Psoriasis Sister    Arthritis Sister    Lung cancer Maternal Grandfather    Heart disease Maternal Grandfather    Diabetes Paternal Grandmother    Glaucoma Paternal Grandmother    Hypertension Paternal Grandmother    Heart failure Paternal Grandmother    Diabetes Paternal Grandfather    Hypertension Paternal Grandfather     Social History Social History   Tobacco Use   Smoking status: Never   Smokeless tobacco: Never  Vaping Use   Vaping status: Never Used  Substance Use Topics   Alcohol use: No   Drug use: No     Allergies   Liraglutide, Codeine, Acetaminophen, Ibuprofen,  Amoxicillin, Metformin, and Penicillins   Review of Systems Review of  Systems  Respiratory:  Positive for cough.   Per HPI   Physical Exam Triage Vital Signs ED Triage Vitals  Encounter Vitals Group     BP 11/18/23 1031 (!) 144/89     Systolic BP Percentile --      Diastolic BP Percentile --      Pulse Rate 11/18/23 1031 93     Resp 11/18/23 1031 17     Temp 11/18/23 1031 98.3 F (36.8 C)     Temp Source 11/18/23 1031 Oral     SpO2 11/18/23 1031 93 %     Weight --      Height --      Head Circumference --      Peak Flow --      Pain Score 11/18/23 1038 6     Pain Loc --      Pain Education --      Exclude from Growth Chart --    No data found.  Updated Vital Signs BP (!) 144/89 (BP Location: Right Arm)   Pulse 93   Temp 98.3 F (36.8 C) (Oral)   Resp 17   SpO2 93%   Visual Acuity Right Eye Distance:   Left Eye Distance:   Bilateral Distance:    Right Eye Near:   Left Eye Near:    Bilateral Near:     Physical Exam Vitals and nursing note reviewed.  Constitutional:      Appearance: She is not ill-appearing or toxic-appearing.  HENT:     Head: Normocephalic and atraumatic.     Right Ear: Hearing and external ear normal.     Left Ear: Hearing and external ear normal.     Nose: Nose normal.     Mouth/Throat:     Lips: Pink.  Eyes:     General: Lids are normal. Vision grossly intact. Gaze aligned appropriately.     Extraocular Movements: Extraocular movements intact.     Conjunctiva/sclera: Conjunctivae normal.  Pulmonary:     Effort: Pulmonary effort is normal.  Musculoskeletal:     Cervical back: Neck supple.  Skin:    General: Skin is warm and dry.     Capillary Refill: Capillary refill takes less than 2 seconds.     Findings: No rash.  Neurological:     General: No focal deficit present.     Mental Status: She is alert and oriented to person, place, and time. Mental status is at baseline.     Cranial Nerves: No dysarthria or facial asymmetry.  Psychiatric:        Mood and Affect: Mood normal.        Speech:  Speech normal.        Behavior: Behavior normal.        Thought Content: Thought content normal.        Judgment: Judgment normal.      UC Treatments / Results  Labs (all labs ordered are listed, but only abnormal results are displayed) Labs Reviewed  POCT URINALYSIS DIP (MANUAL ENTRY) - Abnormal; Notable for the following components:      Result Value   Glucose, UA =500 (*)    All other components within normal limits  POCT FASTING CBG [facility] MANUAL ENTRY - Abnormal; Notable for the following components:   POCT Glucose ([facility]) 189 (*)    All other components within normal limits    EKG   Radiology No results  found.  Procedures Procedures (including critical care time)  Medications Ordered in UC Medications - No data to display  Initial Impression / Assessment and Plan / UC Course  I have reviewed the triage vital signs and the nursing notes.  Pertinent labs & imaging results that were available during my care of the patient were reviewed by me and considered in my medical decision making (see chart for details).   1. Type 2 diabetes mellitus with hyperglycemia with long-term current use of insulin, mouth dryness, dizziness Urinalysis is unremarkable for ketonuria though shows glycosuria. CBG currently 189. Neurologically intact to baseline. Patient would benefit from further workup and evaluation in the emergency department setting to obtain stat labs to rule out significant electrolyte abnormality/AKI as a result of significant fluctuations in blood sugar as a result of stopping Mounjaro. DM uncontrolled.   Discussed clinical concerns/exam findings leading to recommendation for further workup in the ER setting and risks of deferring ER visit with patient/family. Patient/family express understanding and agreement with plan, discharged to ER via personal vehicle.    Final Clinical Impressions(s) / UC Diagnoses   Final diagnoses:  Type 2 diabetes mellitus with  hyperglycemia, with long-term current use of insulin (HCC)  Mouth dryness  Dizziness   Discharge Instructions   None    ED Prescriptions   None    PDMP not reviewed this encounter.   Carlisle Beers, [place] 11/18/23 [phone]

## 2023-11-18 NOTE — ED Notes (Signed)
 Patient is being discharged from the Urgent Care and sent to the Emergency Department via POV . Per Rennis Chris, FNP, patient is in need of higher level of care due to uncontrolled blood sugar. Patient is aware and verbalizes understanding of plan of care.  Vitals:   11/18/23 1031  BP: (!) 144/89  Pulse: 93  Resp: 17  Temp: 98.3 F (36.8 C)  SpO2: 93%

## 2023-11-18 NOTE — ED Triage Notes (Addendum)
 Seen at Urgent Care. C/o wheezing for 3 days.   States diabetic and her blood sugar has been over 300s for 4 weeks, has had vision difficulty and dizziness, increased thirst. Glucose in urine at urgent care. Also has a red spot on bottom of left foot. Also having cough, congestion since Friday.

## 2023-11-18 NOTE — ED Provider Notes (Signed)
 [facility] Provider Note   CSN: [id number] Arrival date & time: 11/18/23  1133     History  Chief Complaint  Patient presents with   Hyperglycemia    Lisa Cole is a 43 y.o. female.  HPI Patient was that she initially went to her PCP due to concerns for a nodule on the sole of her left foot and some leg pain.  She reports that she was concerned about a blood clot.  She had some symptoms of wheezing and nasal congestion causing shortness of breath and was worried it might be due to a blood clot.  Patient reports that she was exposed to her niece a week ago who had congestion and coughing and subsequently had to go to the hospital to be treated.  Patient reports she tried her inhaler last night and felt that it made the shortness of breath actually worse. Patient reports when she went to see her primary care provider, her blood sugars were elevated.  She reports they discussed her blood sugars and she has been having difficulty managing them over the past several weeks with numbers as high as the 400s.  Patient reports that she attributes this to having tapered and then discontinued her Mounjaro.  She was having symptoms that she thought was secondary to the [facility] including reflux-like symptoms.  The patient reports that she had run out of test strips due to insurance changes.  She reports she was judging how much insulin to take based on how she was feeling.  She reports during that period time she thinks her blood pressures got out of control but she has had testers back for the past 2 days and has been increasing her insulin dosing and blood sugars have been responding.    Home Medications Prior to Admission medications   Medication Sig Start Date End Date Taking? Authorizing Provider  benzonatate (TESSALON PERLES) 100 MG capsule Take 1 capsule (100 mg total) by mouth 3 (three) times daily as needed for cough. 11/18/23  Yes Arby Barrette, MD  albuterol (PROVENTIL HFA;VENTOLIN HFA) 108 (90 Base) MCG/ACT inhaler Inhale 2 puffs into the lungs every 4 (four) hours as needed for wheezing or shortness of breath.     [provider]  azithromycin (ZITHROMAX) 250 MG tablet Take 1 tablet (250 mg total) by mouth daily. Take first 2 tablets together, then 1 every day until finished. Patient not taking: Reported on 04/05/2023 09/19/21   Milagros Loll, MD  Blood Glucose Monitoring Suppl [facility] VERIO) w/Device KIT Use as advised Patient not taking: Reported on 08/02/2023 08/04/18   Carlus Pavlov, MD  Blood Pressure Monitoring KIT Monitor blood pressure daily secondary to medications taking. 11/14/18   Chilton Si, MD  budesonide-formoterol [facility]) 160-4.5 MCG/ACT inhaler Inhale 2 puffs into the lungs 2 (two) times daily. 01/25/16   de Dios, McColl A, MD  Cholecalciferol (D3 VITAMIN PO) Take by mouth daily.    [provider]  clindamycin (CLEOCIN) 150 MG capsule Take 3 capsules (450 mg total) by mouth 3 (three) times daily. Patient not taking: Reported on 04/05/2023 10/12/21   Teressa Lower, PA-C  Continuous Glucose Sensor (FREESTYLE LIBRE 3 SENSOR) MISC 1 each by Does not apply route every 14 (fourteen) days. 04/05/23   Carlus Pavlov, MD  ergocalciferol (VITAMIN D2) 1.25 MG (50000 UT) capsule Take 1 capsule (50,000 Units total) by mouth once a week. 12/26/18   Carlus Pavlov, MD  glucose  blood test strip Use as instructed 3x a day - Accu-chek Guide 01/31/23   Carlus Pavlov, MD  insulin lispro (HUMALOG KWIKPEN) 200 UNIT/ML KwikPen INJECT 25-30 UNITS UNDER THE SKIN THREE TIMES DAILY BEFORE MEALS Patient taking differently: Inject 10 Units into the skin daily. INJECT 10UNITS UNDER THE SKIN DAILY 11/10/20   Carlus Pavlov, MD  Insulin NPH, Human,, Isophane, (HUMULIN N KWIKPEN) 100 UNIT/ML Kiwkpen Inject 12 Units into the skin 2 (two) times daily. INJECT 24 UNITS UNDER THE SKIN TWICE DAILY  08/02/23   Carlus Pavlov, MD  Insulin Pen Needle (BD PEN NEEDLE NANO 2ND GEN) 32G X 4 MM MISC Use to inject insulin - 5x a day 11/10/20   Carlus Pavlov, MD  Lancet Devices (ONE TOUCH DELICA LANCING DEV) MISC Use to check sugar 2 times daily. 08/04/18   Carlus Pavlov, MD  linaclotide (LINZESS) 145 MCG CAPS capsule Take 145 mcg by mouth daily before breakfast.    [provider]  losartan (COZAAR) 50 MG tablet Take 50 mg by mouth daily. Patient not taking: Reported on 08/02/2023    [provider]  magnesium gluconate (MAGONATE) 30 MG tablet Take 30 mg by mouth 2 (two) times daily. Patient not taking: Reported on 08/02/2023    [provider]  methocarbamol (ROBAXIN) 500 MG tablet Take 1 tablet (500 mg total) by mouth 2 (two) times daily. Patient not taking: Reported on 08/02/2023 11/07/18   Elson Areas, PA-C  NONFORMULARY OR COMPOUNDED ITEM Glucose and cinnamon Patient not taking: Reported on 08/02/2023    [provider]  nortriptyline (PAMELOR) 10 MG capsule Take 1 capsule at bedtime for 7 days, then increase to 2 capsules at bedtime. Patient not taking: Reported on 08/02/2023 03/06/19   [name], DO  omeprazole (PRILOSEC) 20 MG capsule Take 1 capsule by mouth daily. 12/08/15   [provider]  predniSONE (DELTASONE) 20 MG tablet Take 2 tablets (40 mg total) by mouth daily. 08/25/23   Renne Crigler, PA-C  pyridOXINE (VITAMIN B-6) 100 MG tablet Take 100 mg by mouth daily. Patient not taking: Reported on 08/02/2023    [provider]  rosuvastatin (CRESTOR) 10 MG tablet Take 1 tablet (10 mg total) by mouth daily. Patient not taking: Reported on 08/02/2023 06/19/21   Carlus Pavlov, MD  tirzepatide [facility]) 5 MG/0.5ML Pen Inject 5 mg into the skin once a week. 05/28/23   Carlus Pavlov, MD      Allergies    Liraglutide, Codeine, Acetaminophen, Ibuprofen, Amoxicillin, Metformin, and Penicillins    Review of Systems   Review  of Systems  Physical Exam Updated Vital Signs BP 128/81   Pulse 88   Temp 98.6 F (37 C)   Resp 17   Ht 5' (1.524 m)   Wt 72.1 kg   SpO2 99%   BMI 31.05 kg/m  Physical Exam Constitutional:      Comments: Alert nontoxic.  Well in appearance.  Clear mental status.  No respiratory distress.  HENT:     Head: Normocephalic and atraumatic.     Mouth/Throat:     Mouth: Mucous membranes are moist.     Pharynx: Oropharynx is clear.  Eyes:     Extraocular Movements: Extraocular movements intact.     Pupils: Pupils are equal, round, and reactive to light.  Cardiovascular:     Rate and Rhythm: Normal rate and regular rhythm.  Pulmonary:     Effort: Pulmonary effort is normal.     Breath sounds: Normal  breath sounds.  Abdominal:     General: There is no distension.     Palpations: Abdomen is soft.     Tenderness: There is no abdominal tenderness. There is no guarding.  Musculoskeletal:        General: Tenderness present. No swelling. Normal range of motion.     Cervical back: Neck supple.     Right lower leg: No edema.     Left lower leg: No edema.     Comments: Patient has isolated area on the sole of the left foot between the ball the foot and the heel where there is some subtly palpable nodule that is tender.  No general swelling of the foot, no erythema of the foot, warm and dry dorsalis pedis pulse 2+.  This feels most consistent with a small nodule of the plantar fascia.  Neurological:     General: No focal deficit present.     Mental Status: She is oriented to person, place, and time.     Motor: No weakness.     Coordination: Coordination normal.  Psychiatric:        Mood and Affect: Mood normal.     ED Results / Procedures / Treatments   Labs (all labs ordered are listed, but only abnormal results are displayed) Labs Reviewed  RESP PANEL BY RT-PCR (RSV, FLU A&B, COVID)  RVPGX2 - Abnormal; Notable for the following components:      Result Value   Resp Syncytial Virus  by PCR POSITIVE (*)    All other components within normal limits  COMPREHENSIVE METABOLIC PANEL - Abnormal; Notable for the following components:   Glucose, Bld 144 (*)    All other components within normal limits  CBG MONITORING, ED - Abnormal; Notable for the following components:   Glucose-Capillary 167 (*)    All other components within normal limits  I-STAT VENOUS BLOOD GAS, ED - Abnormal; Notable for the following components:   pO2, Ven 29 (*)    All other components within normal limits  CBC WITH DIFFERENTIAL/PLATELET  LIPASE, BLOOD  TROPONIN I (HIGH SENSITIVITY)    EKG EKG Interpretation Date/Time:  Monday November 18 2023 13:27:59 EST Ventricular Rate:  86 PR Interval:  130 QRS Duration:  80 QT Interval:  342 QTC Calculation: 409 R Axis:   17  Text Interpretation: Sinus rhythm Low voltage, precordial leads Borderline T abnormalities, inferior leads no sig change from previous Confirmed by Arby Barrette [phone]) on 11/18/2023 3:23:25 PM  Radiology DG Chest 2 View Result Date: 11/18/2023 CLINICAL DATA:  Cough, hyperglycemia EXAM: CHEST - 2 VIEW COMPARISON:  08/25/2023 FINDINGS: The heart size and mediastinal contours are within normal limits. Both lungs are clear. The visualized skeletal structures are unremarkable. IMPRESSION: No active cardiopulmonary disease. Electronically Signed   By: Sharlet Salina M.D.   On: 11/18/2023 15:21    Procedures Procedures    Medications Ordered in ED Medications  sodium chloride 0.9 % bolus 1,000 mL (0 mLs Intravenous Stopped 11/18/23 1447)    ED Course/ Medical Decision Making/ A&P                                 Medical Decision Making Amount and/or Complexity of Data Reviewed Labs: ordered. Radiology: ordered.  Risk Prescription drug management.   Patient presents as outlined.  She had been seen at PCP office and referred for further evaluation to include metabolic evaluation for hyperglycemia poorly controlled  over  the past week and constellation of respiratory symptoms.  Will proceed with diagnostic evaluation to include respiratory panel, chest x-ray, EKG and troponin.  Fingerstick blood sugar 167 in the emergency department.  Patient had been treating herself at home with elevated dose of long and short acting insulin.  At this time does not appear to have significant hyperglycemia, will proceed with mild rehydration and metabolic panel and venous gas.  GFR normal.  Repeat blood sugar is 144 troponin 4 lipase 33 white count 6.5 normal differential.  RSV positive.  At this time patient's blood sugars are significantly improved.  She has been managing her insulin at home.  I feel she is stable to continue her insulin per existing instructions and follow-up with endocrinology.  Patient's respiratory symptoms are secondary to RSV.    I have personally reviewed chest x-ray and did not see any focal consolidations.  Subsequent radiology review no acute findings.  At this time stable for symptomatic management of RSV.  Appears uncomplicated.  Patient has a small nodule on the sole of the foot that I suspect is a small focus of tendinitis in the plantar fascia.  Does not appear to be acute infection or traumatic injury.  She has a podiatrist and will schedule follow-up.        Final Clinical Impression(s) / ED Diagnoses Final diagnoses:  Hyperglycemia  RSV (acute bronchiolitis due to respiratory syncytial virus)    Rx / DC Orders ED Discharge Orders          Ordered    benzonatate (TESSALON PERLES) 100 MG capsule  3 times daily PRN        11/18/23 1516              Arby Barrette, MD 11/18/23 1526

## 2023-11-18 NOTE — ED Provider Notes (Incomplete)
 [facility] Provider Note   CSN: [id number] Arrival date & time: 11/18/23  1133     History {Add pertinent medical, surgical, social history, OB history to HPI:1} Chief Complaint  Patient presents with   Hyperglycemia    Lisa Cole is a 43 y.o. female.  HPI     Home Medications Prior to Admission medications   Medication Sig Start Date End Date Taking? Authorizing Provider  albuterol (PROVENTIL HFA;VENTOLIN HFA) 108 (90 Base) MCG/ACT inhaler Inhale 2 puffs into the lungs every 4 (four) hours as needed for wheezing or shortness of breath.     [provider]  azithromycin (ZITHROMAX) 250 MG tablet Take 1 tablet (250 mg total) by mouth daily. Take first 2 tablets together, then 1 every day until finished. Patient not taking: Reported on 04/05/2023 09/19/21   Milagros Loll, MD  Blood Glucose Monitoring Suppl [facility] VERIO) w/Device KIT Use as advised Patient not taking: Reported on 08/02/2023 08/04/18   Carlus Pavlov, MD  Blood Pressure Monitoring KIT Monitor blood pressure daily secondary to medications taking. 11/14/18   Chilton Si, MD  budesonide-formoterol [facility]) 160-4.5 MCG/ACT inhaler Inhale 2 puffs into the lungs 2 (two) times daily. 01/25/16   de Dios, Carter A, MD  Cholecalciferol (D3 VITAMIN PO) Take by mouth daily.    [provider]  clindamycin (CLEOCIN) 150 MG capsule Take 3 capsules (450 mg total) by mouth 3 (three) times daily. Patient not taking: Reported on 04/05/2023 10/12/21   Teressa Lower, PA-C  Continuous Glucose Sensor (FREESTYLE LIBRE 3 SENSOR) MISC 1 each by Does not apply route every 14 (fourteen) days. 04/05/23   Carlus Pavlov, MD  ergocalciferol (VITAMIN D2) 1.25 MG (50000 UT) capsule Take 1 capsule (50,000 Units total) by mouth once a week. 12/26/18   Carlus Pavlov, MD  glucose blood test strip Use as instructed 3x a day - Accu-chek Guide 01/31/23   Carlus Pavlov, MD  insulin lispro (HUMALOG KWIKPEN) 200 UNIT/ML KwikPen INJECT 25-30 UNITS UNDER THE SKIN THREE TIMES DAILY BEFORE MEALS Patient taking differently: Inject 10 Units into the skin daily. INJECT 10UNITS UNDER THE SKIN DAILY 11/10/20   Carlus Pavlov, MD  Insulin NPH, Human,, Isophane, (HUMULIN N KWIKPEN) 100 UNIT/ML Kiwkpen Inject 12 Units into the skin 2 (two) times daily. INJECT 24 UNITS UNDER THE SKIN TWICE DAILY 08/02/23   Carlus Pavlov, MD  Insulin Pen Needle (BD PEN NEEDLE NANO 2ND GEN) 32G X 4 MM MISC Use to inject insulin - 5x a day 11/10/20   Carlus Pavlov, MD  Lancet Devices (ONE TOUCH DELICA LANCING DEV) MISC Use to check sugar 2 times daily. 08/04/18   Carlus Pavlov, MD  linaclotide (LINZESS) 145 MCG CAPS capsule Take 145 mcg by mouth daily before breakfast.    [provider]  losartan (COZAAR) 50 MG tablet Take 50 mg by mouth daily. Patient not taking: Reported on 08/02/2023    [provider]  magnesium gluconate (MAGONATE) 30 MG tablet Take 30 mg by mouth 2 (two) times daily. Patient not taking: Reported on 08/02/2023    [provider]  methocarbamol (ROBAXIN) 500 MG tablet Take 1 tablet (500 mg total) by mouth 2 (two) times daily. Patient not taking: Reported on 08/02/2023 11/07/18   Elson Areas, PA-C  NONFORMULARY OR COMPOUNDED ITEM Glucose and cinnamon Patient not taking: Reported on 08/02/2023    [provider]  nortriptyline (PAMELOR) 10 MG capsule Take 1 capsule  at bedtime for 7 days, then increase to 2 capsules at bedtime. Patient not taking: Reported on 08/02/2023 03/06/19   [name], DO  omeprazole (PRILOSEC) 20 MG capsule Take 1 capsule by mouth daily. 12/08/15   [provider]  predniSONE (DELTASONE) 20 MG tablet Take 2 tablets (40 mg total) by mouth daily. 08/25/23   Renne Crigler, PA-C  pyridOXINE (VITAMIN B-6) 100 MG tablet Take 100 mg by mouth daily. Patient not taking: Reported on 08/02/2023     [provider]  rosuvastatin (CRESTOR) 10 MG tablet Take 1 tablet (10 mg total) by mouth daily. Patient not taking: Reported on 08/02/2023 06/19/21   Carlus Pavlov, MD  tirzepatide [facility]) 5 MG/0.5ML Pen Inject 5 mg into the skin once a week. 05/28/23   Carlus Pavlov, MD      Allergies    Liraglutide, Codeine, Acetaminophen, Ibuprofen, Amoxicillin, Metformin, and Penicillins    Review of Systems   Review of Systems  Physical Exam Updated Vital Signs BP (!) 134/90   Pulse 92   Temp 98.6 F (37 C)   Resp 18   Ht 5' (1.524 m)   Wt 72.1 kg   SpO2 97%   BMI 31.05 kg/m  Physical Exam  ED Results / Procedures / Treatments   Labs (all labs ordered are listed, but only abnormal results are displayed) Labs Reviewed  RESP PANEL BY RT-PCR (RSV, FLU A&B, COVID)  RVPGX2 - Abnormal; Notable for the following components:      Result Value   Resp Syncytial Virus by PCR POSITIVE (*)    All other components within normal limits  CBG MONITORING, ED - Abnormal; Notable for the following components:   Glucose-Capillary 167 (*)    All other components within normal limits    EKG None  Radiology No results found.  Procedures Procedures  {Document cardiac monitor, telemetry assessment procedure when appropriate:1}  Medications Ordered in ED Medications - No data to display  ED Course/ Medical Decision Making/ A&P   {   Click here for ABCD2, HEART and other calculatorsREFRESH Note before signing :1}                              Medical Decision Making  ***  {Document critical care time when appropriate:1} {Document review of labs and clinical decision tools ie heart score, Chads2Vasc2 etc:1}  {Document your independent review of radiology images, and any outside records:1} {Document your discussion with family members, caretakers, and with consultants:1} {Document social determinants of health affecting pt's care:1} {Document your decision making why or why  not admission, treatments were needed:1} Final Clinical Impression(s) / ED Diagnoses Final diagnoses:  None    Rx / DC Orders ED Discharge Orders     None

## 2023-11-18 NOTE — ED Triage Notes (Signed)
 Pt c/o cough, wheezing for 3 days   She also noticed left bottom of foot has been painful and swollen and red for 2 weeks.  States her blood sugar has not been well controlled either for roughly 2 weeks even with insulin

## 2023-11-19 ENCOUNTER — Other Ambulatory Visit: Payer: Self-pay | Admitting: Internal Medicine

## 2023-11-19 MED ORDER — FREESTYLE LIBRE 3 PLUS SENSOR MISC: 1.0000 | 3 refills | Status: DC

## 2023-11-20 ENCOUNTER — Other Ambulatory Visit: Payer: Self-pay | Admitting: Internal Medicine

## 2023-11-22 ENCOUNTER — Ambulatory Visit: Payer: Medicare PPO | Admitting: Psychology

## 2023-11-28 ENCOUNTER — Ambulatory Visit: Payer: Medicare Other | Admitting: Psychology

## 2023-11-28 DIAGNOSIS — F411 Generalized anxiety disorder: Secondary | ICD-10-CM

## 2023-11-28 DIAGNOSIS — F331 Major depressive disorder, recurrent, moderate: Secondary | ICD-10-CM

## 2023-11-28 DIAGNOSIS — F4310 Post-traumatic stress disorder, unspecified: Secondary | ICD-10-CM | POA: Diagnosis not present
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
## 2023-11-29 ENCOUNTER — Telehealth: Payer: Self-pay

## 2023-11-29 ENCOUNTER — Other Ambulatory Visit (HOSPITAL_COMMUNITY): Payer: Self-pay

## 2023-11-29 ENCOUNTER — Ambulatory Visit: Payer: Medicare PPO | Admitting: Internal Medicine

## 2023-11-29 NOTE — Telephone Encounter (Signed)
Patient needs a PA for Dexcom G7

## 2023-11-29 NOTE — Telephone Encounter (Signed)
 Pharmacy Patient Advocate Encounter   Received notification from Pt Calls Messages that prior authorization for Dexcom G7 sensor is required/requested.   Insurance verification completed.   The patient is insured through [place] and [address].   Per test claim: Plan exclusion. May be covered under part B
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
## 2023-12-03 NOTE — Telephone Encounter (Signed)
 Lisa Cole (You)  [facility] just now submitted to [facility]

## 2023-12-06 ENCOUNTER — Ambulatory Visit: Payer: Medicare Other | Admitting: Psychology

## 2023-12-06 DIAGNOSIS — F411 Generalized anxiety disorder: Secondary | ICD-10-CM

## 2023-12-06 DIAGNOSIS — F4310 Post-traumatic stress disorder, unspecified: Secondary | ICD-10-CM | POA: Diagnosis not present

## 2023-12-06 DIAGNOSIS — F331 Major depressive disorder, recurrent, moderate: Secondary | ICD-10-CM | POA: Diagnosis not present

## 2023-12-06 NOTE — Progress Notes (Signed)
 [facility] Counselor/Therapist Progress Notes  Patient ID: Lisa Cole, MRN: [id number],    Date: 12/06/2023  Time Spent: 58  minutes  Time in: 12:02 Time out:  1:00  Treatment Type: Individual Therapy  Reported Symptoms: worry, depression, physical pain  Mental Status Exam: Appearance:  Casual  Behavior: normal  Motor: normal  Speech/Language:  normal  Affect: blunted  Mood: pleasant  Thought process: WNL  Thought content:   WNL  Sensory/Perceptual disturbances:   WNL  Orientation: oriented to person, place, time/date, and situation  Attention: Good  Concentration: Fair  Memory: WNL  Fund of knowledge:  Good  Insight:   Fair  Judgment:  Poor  Impulse Control: Poor   Risk Assessment: Danger to Self:  No Self-injurious Behavior: No Danger to Others: No Duty to Warn:no Physical Aggression / Violence:No  Access to Firearms a concern: No  Gang Involvement:No   Subjective: The patient attended a face-to-face individual therapy session via video visit  today.  The patient gave verbal consent for the session to be on video on caregility and is aware of the limitations of telehealth.  The patient was in her car alone and the therapist was in the office.  We ended up having to do the session on the phone because the patient was not able to access her computer so we could do care agility.  We talked about the things that are going on in her life now and she was approved for her house and she has an offer and on one and she is thrilled about that.  She talked about feeling more confident with herself and she has made an extreme amount of progress in therapy with that and is able to do a better job of not reacting as strongly and not causing herself problems with how she handles situations.  The patient talked about how proud she was that she has come [place] and not having any money to being able to come through and get a house on her own.  She  currently is living with her parents and she will close on the house on [address] 13.  We talked about this being a wonderful accomplishment for her and she feels very happy about that we also processed a relationship that she has had in the past but she is currently not sure that she wants to get back in that and we talked about her holding the line until she knows for sure that that is the relationship that is good for her and she is going to get some of what she wants.  Interventions: Cognitive Behavioral Therapy and Mindfulness Meditation, problem solving and psychoeducation.  Diagnosis:Major depressive disorder, recurrent episode, moderate (HCC)  Generalized anxiety disorder  PTSD (post-traumatic stress disorder)  Plan: Treatment Plan   Strengths/Abilities:  intelligent, motivated   Treatment Preferences: Outpatient Individual therapy  Statement of Needs:  "I have anxiety and depression"  Symptoms:  Autonomic hyperactivity (e.g., palpitations, shortness of breath, dry mouth, trouble swallowing,  nausea, diarrhea).: (Status: improved). Excessive and/or unrealistic worry that  is difficult to control occurring more days than not for at least 6 months about a number of events or  activities.: (Status: improved). Hypervigilance (e.g., feeling constantly on  edge, experiencing concentration difficulties, having trouble falling or staying asleep, exhibiting a  general state of irritability).: (Status: improved). Motor tension (e.g.,  restlessness, tiredness, shakiness, muscle tension).: (Status: improved).  Problems Addressed:  Depression and Anxiety  Goals:  LTG:1. Enhance ability to effectively cope with the full variety of life's worries  and anxieties.  60% 2. Learn and implement coping skills that result in a reduction of anxiety  and worry, and improved daily functioning  60% 3. Reduce overall frequency, intensity, and duration of the anxiety so that  daily functioning is not  impaired.  50% 4. Resolve the core conflict that is the source of anxiety.  10% 5. Stabilize anxiety level while increasing ability to function on a daily  20% STG: 1. Identify, challenge, and replace biased, fearful self-talk with positive, realistic, and empowering self talk  50% 2. Learn and implement problem-solving strategies for realistically addressing worries.  50%   3.Learn and implement calming skills to reduce overall anxiety and manage anxiety symptoms.  50%   Target Date:  10/13/2024 Frequency:  weekly to biweekly Modality: Individual therapy Interventions by Therapist: CBT, problem solving, EMDR, insight oriented and mindfulness meditation  Patient has approved this treatment plan  Ehab Humber G Davis Vannatter, [name]

## 2023-12-12 ENCOUNTER — Other Ambulatory Visit: Payer: Self-pay | Admitting: Internal Medicine

## 2023-12-13 ENCOUNTER — Telehealth: Payer: Self-pay

## 2023-12-13 ENCOUNTER — Ambulatory Visit: Payer: Medicare Other | Admitting: Psychology

## 2023-12-13 DIAGNOSIS — F4310 Post-traumatic stress disorder, unspecified: Secondary | ICD-10-CM | POA: Diagnosis not present

## 2023-12-13 DIAGNOSIS — F411 Generalized anxiety disorder: Secondary | ICD-10-CM | POA: Diagnosis not present

## 2023-12-13 DIAGNOSIS — F331 Major depressive disorder, recurrent, moderate: Secondary | ICD-10-CM | POA: Diagnosis not present

## 2023-12-13 NOTE — Progress Notes (Signed)
 [facility] Counselor/Therapist Progress Notes  Patient ID: Manaal HERTA HINK, MRN: [id number],    Date: 12/13/2023  Time Spent: 58   minutes  Time in: 3:02 Time out:  4:00  Treatment Type: Individual Therapy  Reported Symptoms: worry, depression, physical pain  Mental Status Exam: Appearance:  Casual  Behavior: normal  Motor: normal  Speech/Language:  normal  Affect: blunted  Mood: anxious  Thought process: WNL  Thought content:   WNL  Sensory/Perceptual disturbances:   WNL  Orientation: oriented to person, place, time/date, and situation  Attention: Good  Concentration: Fair  Memory: WNL  Fund of knowledge:  Good  Insight:   Fair  Judgment:  Poor  Impulse Control: Poor   Risk Assessment: Danger to Self:  No Self-injurious Behavior: No Danger to Others: No Duty to Warn:no Physical Aggression / Violence:No  Access to Firearms a concern: No  Gang Involvement:No   Subjective: The patient attended a face-to-face individual therapy session via video visit  today.  The patient gave verbal consent for the session to be on video on caregility and is aware of the limitations of telehealth.  The patient was in her car alone and the therapist was in the office.  We ended up having to do the session on the phone because the patient was not able to access her computer so we could do caregility.  The patient reports that she is very upset.  She does present as very anxious today.  She states that she has been doing a lot today and she worked and is running some errands.  The patient has a tendency to work herself up about things and apparently there are some problems with her credit score that she may be able to get fixed but she doing some all or nothing thinking with her feeling that she is might not be able to get a house.  She also is very frustrated because she is living in the house with her parents and they are very difficult to live with and they have been requesting  that she pay for things and I suggested that she do some limit setting and just give them a certain amount of money and not continue to pay for at things that are extra.  She seems to be getting roped into paying for things at different stores and it looks like she is probably ending up paying for more than she normally would if she was out on her own.  I gave her some positive feedback and told her that she is likely to go ahead and be able to work this situation out with the house and that if she is not supposed to get the how she want but she will find something else.  She seemed to calm down some by the end of the session.  We will continue to work on helping her reframe things and to decrease her catastrophizing and black and white thinking.   Interventions: Cognitive Behavioral Therapy and Mindfulness Meditation, problem solving and psychoeducation.  Diagnosis:Major depressive disorder, recurrent episode, moderate (HCC)  Generalized anxiety disorder  PTSD (post-traumatic stress disorder)  Plan: Treatment Plan   Strengths/Abilities:  intelligent, motivated   Treatment Preferences: Outpatient Individual therapy  Statement of Needs:  "I have anxiety and depression"  Symptoms:  Autonomic hyperactivity (e.g., palpitations, shortness of breath, dry mouth, trouble swallowing,  nausea, diarrhea).: (Status: improved). Excessive and/or unrealistic worry that  is difficult to control occurring more days than not for at least  6 months about a number of events or  activities.: (Status: improved). Hypervigilance (e.g., feeling constantly on  edge, experiencing concentration difficulties, having trouble falling or staying asleep, exhibiting a  general state of irritability).: (Status: improved). Motor tension (e.g.,  restlessness, tiredness, shakiness, muscle tension).: (Status: improved).  Problems Addressed:  Depression and Anxiety  Goals:  LTG:1. Enhance ability to effectively cope with the  full variety of life's worries  and anxieties.  60% 2. Learn and implement coping skills that result in a reduction of anxiety  and worry, and improved daily functioning  60% 3. Reduce overall frequency, intensity, and duration of the anxiety so that  daily functioning is not impaired.  50% 4. Resolve the core conflict that is the source of anxiety.  10% 5. Stabilize anxiety level while increasing ability to function on a daily  20% STG: 1. Identify, challenge, and replace biased, fearful self-talk with positive, realistic, and empowering self talk  50% 2. Learn and implement problem-solving strategies for realistically addressing worries.  50%   3.Learn and implement calming skills to reduce overall anxiety and manage anxiety symptoms.  50%   Target Date:  10/13/2024 Frequency:  weekly to biweekly Modality: Individual therapy Interventions by Therapist: CBT, problem solving, EMDR, insight oriented and mindfulness meditation  Patient has approved this treatment plan  Grantham Hippert G Keelen Quevedo, [name]

## 2023-12-13 NOTE — Telephone Encounter (Signed)
 Pharmacy Patient Advocate Encounter   Received notification from CoverMyMeds that prior authorization for [facility] is required/requested.   Insurance verification completed.   The patient is insured through [facility] .   Per test claim: PA required; PA submitted to above mentioned insurance via CoverMyMeds Key/confirmation #/EOC [id number] Status is pending

## 2023-12-16 MED ORDER — DEXCOM G7 SENSOR MISC: 3.0000 | 4 refills | Status: AC

## 2023-12-16 NOTE — Telephone Encounter (Signed)
 [facility] have [facility] and her insurance is no longer [place] its [address].   [name]

## 2023-12-16 NOTE — Telephone Encounter (Signed)
 I called and spoke with the pt. I advised to her that the 1st PA was done under [facility] and they advised that it goes through a DME company. The patient wanted me to send the script back to the pharmacy instead.   Rx was sent in another encounter.

## 2023-12-16 NOTE — Addendum Note (Signed)
 Addended by: Pollie Meyer on: 12/16/2023 11:06 AM   Modules accepted: Orders

## 2023-12-19 ENCOUNTER — Ambulatory Visit (INDEPENDENT_AMBULATORY_CARE_PROVIDER_SITE_OTHER): Payer: Medicare Other | Admitting: Psychology

## 2023-12-19 DIAGNOSIS — F411 Generalized anxiety disorder: Secondary | ICD-10-CM | POA: Diagnosis not present

## 2023-12-19 DIAGNOSIS — F331 Major depressive disorder, recurrent, moderate: Secondary | ICD-10-CM | POA: Diagnosis not present

## 2023-12-19 DIAGNOSIS — F4310 Post-traumatic stress disorder, unspecified: Secondary | ICD-10-CM

## 2023-12-19 NOTE — Progress Notes (Unsigned)
                edge,  experiencing concentration difficulties, having trouble falling or staying asleep, exhibiting a general  state of irritability).: No Description Entered (Status: improved). Motor tension (e.g., restlessness,  tiredness, shakiness, muscle tension).: No Description Entered (Status: improved).  Problems Addressed  Anxiety, Phase Of Life Problems, Anxiety  Goals 1. Learn and implement coping skills that result in a reduction of anxiety  and worry, and improved daily functioning. Objective Learn  and implement calming skills to reduce overall anxiety and manage anxiety symptoms. Target Date: 2025-08-09Frequency: Weekly Progress: 40 Modality: individual  Related Interventions 1. Teach the client calming/relaxation skills (e.g., applied relaxation, progressive muscle  relaxation, cue controlled relaxation; mindful breathing; biofeedback) and how to discriminate  better between relaxation and tension; teach the client how to apply these skills to his/her daily  life (e.g., New Directions in Progressive Muscle Relaxation by Marcelyn Ditty, and  Hazlett-Stevens; Treating Generalized Anxiety Disorder by Rygh and Ida Rogue). Objective Identify, challenge, and replace biased, fearful self-talk with positive, realistic, and empowering selftalk. Target Date: 2024-05-02 Frequency: weekly Progress: 30 Modality: individual Related Interventions 1. Explore the client's schema and self-talk that mediate his/her fear response; assist him/her in  challenging the biases; replace the distorted messages with reality-based alternatives and  positive, realistic self-talk that will increase his/her self-confidence in coping with irrational  fears (see Cognitive Therapy of Anxiety Disorders by Laurence Slate). Objective Learn and implement problem-solving strategies for realistically addressing worries. Target Date: 2025-08-09Frequency: weekly Progress: 40 Modality: individual 2. Resolve conflicted feelings and adapt to the new life circumstances. Objective Apply problem-solving skills to current circumstances. Target Date: 2024-05-02 Frequency: weekly Progress: 20 Modality: individual Related Interventions 1. Teach the client problem-resolution skills (e.g., defining the problem clearly, brainstorming  multiple solutions, listing the pros and cons of each solution, seeking input from others,  selecting and implementing a plan of action, evaluating outcome, and readjusting plan as   necessary).   3. Stabilize anxiety level while increasing ability to function on a daily  basis. Diagnosis F33.1  Major depressive disorder, moderate 300.02 (Generalized anxiety disorder) - Open - [Signifier: n/a]  Axis  none 309.28 (Adjustment disorder with mixed anxiety and depressed  mood) - Open - [Signifier: n/a]  Adjustment Disorder,  With Anxiety   Marital conflict  Major Depressive disorder, moderate  Conditions For Discharge Achievement of treatment goals and objectives.  The patient approved this plan.   Deonna Krummel G Ethridge Sollenberger, [name]

## 2023-12-20 NOTE — Telephone Encounter (Signed)
 Pharmacy Patient Advocate Encounter  Received notification from [facility] that Prior Authorization for Lisa Cole has been APPROVED through 12/13/26

## 2023-12-23 ENCOUNTER — Telehealth: Payer: Self-pay | Admitting: Pharmacy Technician

## 2023-12-23 ENCOUNTER — Other Ambulatory Visit (HOSPITAL_COMMUNITY): Payer: Self-pay

## 2023-12-23 NOTE — Telephone Encounter (Signed)
 Pharmacy Patient Advocate Encounter  Received notification from [facility] that Prior Authorization for Dexcom G7 Sensor has been APPROVED from 12/23/2023 to 12/22/2024. Unable to obtain price due to refill too soon rejection, last fill date 12/23/2023 next available fill date 02/29/2024   PA #/Case ID/Reference #: [id number]

## 2023-12-23 NOTE — Telephone Encounter (Signed)
 Pharmacy Patient Advocate Encounter   Received notification from CoverMyMeds that prior authorization for Dexcom G7 Sensor is required/requested.   Insurance verification completed.   The patient is insured through [facility] .   Per test claim: PA required; PA submitted to above mentioned insurance via CoverMyMeds Key/confirmation #/EOC [id number] Status is pending

## 2024-01-16 ENCOUNTER — Ambulatory Visit (INDEPENDENT_AMBULATORY_CARE_PROVIDER_SITE_OTHER): Payer: Medicare Other | Admitting: Psychology

## 2024-01-16 DIAGNOSIS — F4310 Post-traumatic stress disorder, unspecified: Secondary | ICD-10-CM

## 2024-01-16 DIAGNOSIS — F331 Major depressive disorder, recurrent, moderate: Secondary | ICD-10-CM | POA: Diagnosis not present

## 2024-01-16 DIAGNOSIS — F411 Generalized anxiety disorder: Secondary | ICD-10-CM

## 2024-01-16 NOTE — Progress Notes (Signed)
 [facility] Counselor/Therapist Progress Notes  Patient ID: Lisa Cole, MRN: [id number],    Date: 01/16/2024  Time Spent: 60 minutes  Time in: 5:03 Time out:  6:03  Treatment Type: Individual Therapy  Reported Symptoms: worry, depression, physical pain  Mental Status Exam: Appearance:  Casual  Behavior: normal  Motor: normal  Speech/Language:  normal  Affect: anxious  Mood: Anxious and distraught  Thought process: WNL  Thought content:   WNL  Sensory/Perceptual disturbances:   WNL  Orientation: oriented to person, place, time/date, and situation  Attention: Good  Concentration: Fair  Memory: WNL  Fund of knowledge:  Good  Insight:   Fair  Judgment:  Poor  Impulse Control: Poor   Risk Assessment: Danger to Self:  No Self-injurious Behavior: No Danger to Others: No Duty to Warn:no Physical Aggression / Violence:No  Access to Firearms a concern: No  Gang Involvement:No   Subjective: The patient attended a face-to-face individual therapy session via video visit  today.  The patient gave verbal consent for the session to be on video on caregility and is aware of the limitations of telehealth.  The patient was in her car alone and the therapist was in the office.  We ended the session on the phone because her phone seem to be getting hot.  The patient was very distraught today and reports that over the last couple weeks she has felt suicidal but had no plan.  She reports that the house fell through that she was trying to get and apparently her credit score was 1 point away from being able to qualify.  We talked about how buying a house is very stressful situation and she reports that she did not know that it was stressful.  We talked about her breathing and that she just needs to probably take a break from looking and just let her credit score go up since she has paid some things off recently.  I also told her that probably her situation with getting alone may  have been a problem just because they may have sent her shopping too early to get a house.  I recommended that she wait until her financing get gets totally approved before she goes to look for another house.  The patient did talk about her concern that it was a racial situation and I tried to encourage her to do some deep breathing and just slow things down for a minute and give herself the opportunity to step away from the stress a little.  We talked about her continuing to be positive and I explained to her that she is likely to get the house but she might just have to wait for the next 1 that comes available that she finds wants her financing is approved.  The patient reported feeling better at the end of the session and she denied suicidal ideation.  Interventions: Cognitive Behavioral Therapy and Mindfulness Meditation, problem solving and psychoeducation.  Diagnosis:Major depressive disorder, recurrent episode, moderate (HCC)  Generalized anxiety disorder  PTSD (post-traumatic stress disorder)  Plan: Treatment Plan   Strengths/Abilities:  intelligent, motivated   Treatment Preferences: Outpatient Individual therapy  Statement of Needs:  "I have anxiety and depression"  Symptoms:  Autonomic hyperactivity (e.g., palpitations, shortness of breath, dry mouth, trouble swallowing,  nausea, diarrhea).: (Status: improved). Excessive and/or unrealistic worry that  is difficult to control occurring more days than not for at least 6 months about a number of events or  activities.: (Status: improved). Hypervigilance (  e.g., feeling constantly on  edge, experiencing concentration difficulties, having trouble falling or staying asleep, exhibiting a  general state of irritability).: (Status: improved). Motor tension (e.g.,  restlessness, tiredness, shakiness, muscle tension).: (Status: improved).  Problems Addressed:  Depression and Anxiety  Goals:  LTG:1. Enhance ability to effectively cope with  the full variety of life's worries  and anxieties.  60% 2. Learn and implement coping skills that result in a reduction of anxiety  and worry, and improved daily functioning  60% 3. Reduce overall frequency, intensity, and duration of the anxiety so that  daily functioning is not impaired.  50% 4. Resolve the core conflict that is the source of anxiety.  10% 5. Stabilize anxiety level while increasing ability to function on a daily  20% STG: 1. Identify, challenge, and replace biased, fearful self-talk with positive, realistic, and empowering self talk  50% 2. Learn and implement problem-solving strategies for realistically addressing worries.  50%   3.Learn and implement calming skills to reduce overall anxiety and manage anxiety symptoms.  50%   Target Date:  10/13/2024 Frequency:  weekly to biweekly Modality: Individual therapy Interventions by Therapist: CBT, problem solving, EMDR, insight oriented and mindfulness meditation  Patient has approved this treatment plan  Lisa Sanks G Orene Abbasi, [name]

## 2024-01-24 ENCOUNTER — Ambulatory Visit: Payer: Medicare Other | Admitting: Psychology

## 2024-01-29 ENCOUNTER — Ambulatory Visit: Admitting: Psychology

## 2024-02-25 ENCOUNTER — Ambulatory Visit (INDEPENDENT_AMBULATORY_CARE_PROVIDER_SITE_OTHER): Admitting: Psychology

## 2024-02-25 DIAGNOSIS — F331 Major depressive disorder, recurrent, moderate: Secondary | ICD-10-CM

## 2024-02-25 DIAGNOSIS — F4310 Post-traumatic stress disorder, unspecified: Secondary | ICD-10-CM | POA: Diagnosis not present

## 2024-02-25 DIAGNOSIS — F411 Generalized anxiety disorder: Secondary | ICD-10-CM | POA: Diagnosis not present

## 2024-02-25 NOTE — Progress Notes (Signed)
 [facility] Counselor/Therapist Progress Notes  Patient ID: Lisa Cole, MRN: [id number],    Date: 02/25/2024  Time Spent: 56 minutes  Time in: 9:04 Time out: 10:00  Treatment Type: Individual Therapy  Reported Symptoms: worry, depression, physical pain  Mental Status Exam: Appearance:  Casual  Behavior: normal  Motor: normal  Speech/Language:  normal  Affect: anxious  Mood: Anxious   Thought process: WNL  Thought content:   WNL  Sensory/Perceptual disturbances:   WNL  Orientation: oriented to person, place, time/date, and situation  Attention: Good  Concentration: Fair  Memory: WNL  Fund of knowledge:  Good  Insight:   Fair  Judgment:  Poor  Impulse Control: Poor   Risk Assessment: Danger to Self:  No Self-injurious Behavior: No Danger to Others: No Duty to Warn:no Physical Aggression / Violence:No  Access to Firearms a concern: No  Gang Involvement:No   Subjective: The patient attended a face-to-face individual therapy session via video visit  today.  The patient gave verbal consent for the session to be on video on caregility and is aware of the limitations of telehealth.  The patient was in her car alone and the therapist was in the office.  We ended up having to go to the telephone because her phone got hot from the video.  We did the last 30 minutes or so the telephone.  The patient reports that things have not been going well since I saw her last.  She states that she did not get a house.  She was talking about having issues with her job and apparently because her job was under [employer] they  decided to close down that program.  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  We talked about the circumstance and our stated to her that I thought it was probably a good thing that she did not get the house during the time she was looking because of concerns that she  would not be able to pay the bill.  We did some problem solving during this session and tried to figure out how to get out of her parents house and financially be able to manage.  We talked about her being seen more often and she has made some more appointments.   Interventions: Cognitive Behavioral Therapy and Mindfulness Meditation, problem solving and psychoeducation.  Diagnosis:Major depressive disorder, recurrent episode, moderate (HCC)  Generalized anxiety disorder  PTSD (post-traumatic stress disorder)  Plan: Treatment Plan   Strengths/Abilities:  intelligent, motivated   Treatment Preferences: Outpatient Individual therapy  Statement of Needs:  "I have anxiety and depression"  Symptoms:  Autonomic hyperactivity (e.g., palpitations, shortness of breath, dry mouth, trouble swallowing,  nausea, diarrhea).: (Status: improved). Excessive and/or unrealistic worry that  is difficult to control occurring more days than not for at least 6 months about a number of events or  activities.: (Status: improved). Hypervigilance (e.g., feeling constantly on  edge, experiencing concentration difficulties, having trouble falling or staying asleep, exhibiting a  general state of irritability).: (Status: improved). Motor tension (e.g.,  restlessness, tiredness, shakiness, muscle tension).: (Status: improved).  Problems Addressed:  Depression and Anxiety  Goals:  LTG:1. Enhance ability to effectively cope with the full variety of life's worries  and anxieties.  60% 2. Learn and implement coping skills that result in a reduction of anxiety  and worry, and improved daily functioning  60% 3. Reduce  overall frequency, intensity, and duration of the anxiety so that  daily functioning is not impaired.  50% 4. Resolve the core conflict that is the source of anxiety.  10% 5. Stabilize anxiety level while increasing ability to function on a daily  20% STG: 1. Identify, challenge, and replace biased,  fearful self-talk with positive, realistic, and empowering self talk  50% 2. Learn and implement problem-solving strategies for realistically addressing worries.  50%   3.Learn and implement calming skills to reduce overall anxiety and manage anxiety symptoms.  50%   Target Date:  10/13/2024 Frequency:  weekly to biweekly Modality: Individual therapy Interventions by Therapist: CBT, problem solving, EMDR, insight oriented and mindfulness meditation  Patient has approved this treatment plan  Luis Nickles G Alyxandra Tenbrink, [name]

## 2024-03-10 ENCOUNTER — Ambulatory Visit (INDEPENDENT_AMBULATORY_CARE_PROVIDER_SITE_OTHER): Admitting: Psychology

## 2024-03-10 DIAGNOSIS — F411 Generalized anxiety disorder: Secondary | ICD-10-CM

## 2024-03-10 DIAGNOSIS — F331 Major depressive disorder, recurrent, moderate: Secondary | ICD-10-CM

## 2024-03-10 DIAGNOSIS — F4310 Post-traumatic stress disorder, unspecified: Secondary | ICD-10-CM | POA: Diagnosis not present

## 2024-03-10 NOTE — Progress Notes (Signed)
 [facility] Counselor/Therapist Progress Notes  Patient ID: Lisa Cole, MRN: [id number],    Date: 03/10/2024  Time Spent: 57 minutes  Time in: 10:04 Time out: 11:01  Treatment Type: Individual Therapy  Reported Symptoms: worry, depression, physical pain  Mental Status Exam: Appearance:  Casual  Behavior: normal  Motor: normal  Speech/Language:  normal  Affect: anxious  Mood: Anxious   Thought process: WNL  Thought content:   WNL  Sensory/Perceptual disturbances:   WNL  Orientation: oriented to person, place, time/date, and situation  Attention: Good  Concentration: Fair  Memory: WNL  Fund of knowledge:  Good  Insight:   Fair  Judgment:  Poor  Impulse Control: Poor   Risk Assessment: Danger to Self:  No Self-injurious Behavior: No Danger to Others: No Duty to Warn:no Physical Aggression / Violence:No  Access to Firearms a concern: No  Gang Involvement:No   Subjective: The patient attended a face-to-face individual therapy session via video visit  today.  The patient gave verbal consent for the session to be on video on caregility and is aware of the limitations of telehealth.  The patient was in her car alone and the therapist was in the office.  The patient presents as somewhat anxious today.  The patient reports that she went to the doctor and the doctor's office said she did not have insurance.  I recommended that she contact our office just to make sure that she has insurance coverage as she was on Medicare and Medicaid at 1 point and then she had some insurance through her employer and then got let go from the employer and I am not sure where things are with that.  I did encourage her to speak with someone about that so that she can make sure that she can continue to do therapy with me through our office without requiring a large bill.  The patient talked about all the things that are going on in her life and it sounds like her son is struggling with  his sexual identity and may be having issues with psychosis.  She reports that she is trying to get out of her parents home because she is concerned that that will cause him to decompensate.  He is in the hospital right now and I told her that she just needs to focus on taking care of herself right now and that hopefully he will get what he needs when he is in the hospital.  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  We talked about how to continue to maintain her emotional stability throughout all of these challenges.  Interventions: Cognitive Behavioral Therapy and Mindfulness Meditation, problem solving and psychoeducation.  Diagnosis:Major depressive disorder, recurrent episode, moderate (HCC)  Generalized anxiety disorder  PTSD (post-traumatic stress disorder)  Plan: Treatment Plan   Strengths/Abilities:  intelligent, motivated   Treatment Preferences: Outpatient Individual therapy  Statement of Needs:  I have anxiety and depression  Symptoms:  Autonomic hyperactivity (e.g., palpitations, shortness of breath, dry mouth, trouble swallowing,  nausea, diarrhea).: (Status: improved). Excessive and/or unrealistic worry that  is difficult to control occurring more days than not for at least 6 months about a number of events or  activities.: (Status: improved). Hypervigilance (e.g., feeling constantly on  edge, experiencing concentration difficulties, having trouble falling or staying asleep, exhibiting a  general state of irritability).: (Status: improved). Motor tension (e.g.,  restlessness, tiredness, shakiness, muscle tension).: (  Status: improved).  Problems Addressed:  Depression and Anxiety  Goals:  LTG:1. Enhance ability to effectively cope with the full variety of life's worries  and anxieties.  60% 2. Learn and implement coping skills that result in a reduction of anxiety  and worry, and improved daily  functioning  60% 3. Reduce overall frequency, intensity, and duration of the anxiety so that  daily functioning is not impaired.  50% 4. Resolve the core conflict that is the source of anxiety.  10% 5. Stabilize anxiety level while increasing ability to function on a daily  20% STG: 1. Identify, challenge, and replace biased, fearful self-talk with positive, realistic, and empowering self talk  50% 2. Learn and implement problem-solving strategies for realistically addressing worries.  50%   3.Learn and implement calming skills to reduce overall anxiety and manage anxiety symptoms.  50%   Target Date:  10/13/2024 Frequency:  weekly to biweekly Modality: Individual therapy Interventions by Therapist: CBT, problem solving, EMDR, insight oriented and mindfulness meditation  Patient has approved this treatment plan  Saffron Busey G Annamary Buschman, [name]                                                                                                                    Olaf Mesa G Takisha Pelle, [name]

## 2024-03-17 ENCOUNTER — Ambulatory Visit (INDEPENDENT_AMBULATORY_CARE_PROVIDER_SITE_OTHER): Admitting: Psychology

## 2024-03-17 DIAGNOSIS — F331 Major depressive disorder, recurrent, moderate: Secondary | ICD-10-CM

## 2024-03-17 DIAGNOSIS — F411 Generalized anxiety disorder: Secondary | ICD-10-CM

## 2024-03-17 DIAGNOSIS — F4310 Post-traumatic stress disorder, unspecified: Secondary | ICD-10-CM

## 2024-03-17 NOTE — Progress Notes (Signed)
 [facility] Counselor/Therapist Progress Notes  Patient ID: Lisa Cole, MRN: [id number],    Date: 03/17/2024  Time Spent: 60 minutes  Time in: 10:06 Time out: 11:06  Treatment Type: Individual Therapy  Reported Symptoms: worry, depression, physical pain  Mental Status Exam: Appearance:  Casual  Behavior: normal  Motor: normal  Speech/Language:  normal  Affect: blunted  Mood: pleasant   Thought process: WNL  Thought content:   WNL  Sensory/Perceptual disturbances:   WNL  Orientation: oriented to person, place, time/date, and situation  Attention: Good  Concentration: Fair  Memory: WNL  Fund of knowledge:  Good  Insight:   Fair  Judgment:  Poor  Impulse Control: Poor   Risk Assessment: Danger to Self:  No Self-injurious Behavior: No Danger to Others: No Duty to Warn:no Physical Aggression / Violence:No  Access to Firearms a concern: No  Gang Involvement:No   Subjective: The patient attended a face-to-face individual therapy session via video visit  today.  The patient gave verbal consent for the session to be on video on caregility and is aware of the limitations of telehealth.  The patient was in her car alone and the therapist was in the office.  The patient presents as pleasant and cooperative.  The patient reports that her son is home from the hospital and sounds like he was diagnosed with major depression.  She states that he has been a little resistant to taking the medications but he was able to get him to take one Zoloft this morning.  We talked about how she is going and she seems to be handling things pretty well she is not quite as distraught and upset as she has been in the past and she seems to be managing her stress level better.  She did talk about being frustrated about living with her parents and she is trying to figure out what she is going to do about that situation.  We talked about her options for that.  In addition I told her that we  are getting a new provider in the office here and that may be a good fit for her as far as a transfer when I get ready to retire.    Interventions: Cognitive Behavioral Therapy and Mindfulness Meditation, problem solving and psychoeducation.  Diagnosis:Major depressive disorder, recurrent episode, moderate (HCC)  Generalized anxiety disorder  PTSD (post-traumatic stress disorder)  Plan: Treatment Plan   Strengths/Abilities:  intelligent, motivated   Treatment Preferences: Outpatient Individual therapy  Statement of Needs:  I have anxiety and depression  Symptoms:  Autonomic hyperactivity (e.g., palpitations, shortness of breath, dry mouth, trouble swallowing,  nausea, diarrhea).: (Status: improved). Excessive and/or unrealistic worry that  is difficult to control occurring more days than not for at least 6 months about a number of events or  activities.: (Status: improved). Hypervigilance (e.g., feeling constantly on  edge, experiencing concentration difficulties, having trouble falling or staying asleep, exhibiting a  general state of irritability).: (Status: improved). Motor tension (e.g.,  restlessness, tiredness, shakiness, muscle tension).: (Status: improved).  Problems Addressed:  Depression and Anxiety  Goals:  LTG:1. Enhance ability to effectively cope with the full variety of life's worries  and anxieties.  60% 2. Learn and implement coping skills that result in a reduction of anxiety  and worry, and improved daily functioning  60% 3. Reduce overall frequency, intensity, and duration of the anxiety so that  daily functioning is not impaired.  50% 4. Resolve the core conflict that is  the source of anxiety.  10% 5. Stabilize anxiety level while increasing ability to function on a daily  20% STG: 1. Identify, challenge, and replace biased, fearful self-talk with positive, realistic, and empowering self talk  50% 2. Learn and implement problem-solving strategies for  realistically addressing worries.  50%   3.Learn and implement calming skills to reduce overall anxiety and manage anxiety symptoms.  50%   Target Date:  10/13/2024 Frequency:  weekly to biweekly Modality: Individual therapy Interventions by Therapist: CBT, problem solving, EMDR, insight oriented and mindfulness meditation  Patient has approved this treatment plan  Lisa Cole G Lisa Zunker, [name]

## 2024-03-25 ENCOUNTER — Ambulatory Visit: Admitting: Psychology

## 2024-03-25 DIAGNOSIS — F4310 Post-traumatic stress disorder, unspecified: Secondary | ICD-10-CM

## 2024-03-25 DIAGNOSIS — F411 Generalized anxiety disorder: Secondary | ICD-10-CM | POA: Diagnosis not present

## 2024-03-25 DIAGNOSIS — F331 Major depressive disorder, recurrent, moderate: Secondary | ICD-10-CM

## 2024-03-25 NOTE — Progress Notes (Signed)
 [facility] Counselor/Therapist Progress Notes  Patient ID: Lisa Cole, MRN: [id number],    Date: 03/25/2024  Time Spent: 55 minutes  Time in: 2:05 Time out: 3:00  Treatment Type: Individual Therapy  Reported Symptoms: worry, depression, physical pain  Mental Status Exam: Appearance:  Casual  Behavior: normal  Motor: normal  Speech/Language:  normal  Affect: blunted  Mood: anxious  Thought process: WNL  Thought content:   WNL  Sensory/Perceptual disturbances:   WNL  Orientation: oriented to person, place, time/date, and situation  Attention: Good  Concentration: Fair  Memory: WNL  Fund of knowledge:  Good  Insight:   Fair  Judgment:  Poor  Impulse Control: Poor   Risk Assessment: Danger to Self:  No Self-injurious Behavior: No Danger to Others: No Duty to Warn:no Physical Aggression / Violence:No  Access to Firearms a concern: No  Gang Involvement:No   Subjective: The patient attended a face-to-face individual therapy session via video visit  today.  The patient gave verbal consent for the session to be on video on caregility and is aware of the limitations of telehealth.  The patient was in her car alone and the therapist was in the office.  The patient presents as pleasant and cooperative.  We did most of the session on the video today but we ended up having to go to the telephone because her phone got hot.  Patient was talking today about being so overwhelmed because she is living with her parents and her father has started drinking again and her mother is not dealing well with things.  In addition her son Lisa Cole, seems to be psychotic again and she is not exactly sure how to handle that situation.  She has not heard yet from her job whether she is going to get that yet except that her boss continues to reassure her that she will.  In addition she feels frustrated because she feels kind of stuck at her parents.  We talked about the things that might be  helpful and I recommended that she find out what kind of medicine her son is on and let me know so I can see if anything that he is prescribed would be beneficial for his thought processes.  He was taking Zoloft and is continuing to do so.  His therapist said that an antidepressant would possibly throw him back into a manic state.  I explained to her that that is true but that he does not need to stop the Zoloft just yet and that is possible he could need to be evaluated for something else if the medicines that are at the pharmacy are not for his thinking.  In addition we talked about her going ahead and possibly getting an apartment because the situation with her parents is just so stressful for her.  I encouraged her to look into this as an option.   Interventions: Cognitive Behavioral Therapy and Mindfulness Meditation, problem solving and psychoeducation.  Diagnosis:Major depressive disorder, recurrent episode, moderate (HCC)  Generalized anxiety disorder  PTSD (post-traumatic stress disorder)  Plan: Treatment Plan   Strengths/Abilities:  intelligent, motivated   Treatment Preferences: Outpatient Individual therapy  Statement of Needs:  I have anxiety and depression  Symptoms:  Autonomic hyperactivity (e.g., palpitations, shortness of breath, dry mouth, trouble swallowing,  nausea, diarrhea).: (Status: improved). Excessive and/or unrealistic worry that  is difficult to control occurring more days than not for at least 6 months about a number of events or  activities.: (Status:  improved). Hypervigilance (e.g., feeling constantly on  edge, experiencing concentration difficulties, having trouble falling or staying asleep, exhibiting a  general state of irritability).: (Status: improved). Motor tension (e.g.,  restlessness, tiredness, shakiness, muscle tension).: (Status: improved).  Problems Addressed:  Depression and Anxiety  Goals:  LTG:1. Enhance ability to effectively cope with  the full variety of life's worries  and anxieties.  60% 2. Learn and implement coping skills that result in a reduction of anxiety  and worry, and improved daily functioning  60% 3. Reduce overall frequency, intensity, and duration of the anxiety so that  daily functioning is not impaired.  50% 4. Resolve the core conflict that is the source of anxiety.  10% 5. Stabilize anxiety level while increasing ability to function on a daily  20% STG: 1. Identify, challenge, and replace biased, fearful self-talk with positive, realistic, and empowering self talk  50% 2. Learn and implement problem-solving strategies for realistically addressing worries.  50%   3.Learn and implement calming skills to reduce overall anxiety and manage anxiety symptoms.  50%   Target Date:  10/13/2024 Frequency:  weekly to biweekly Modality: Individual therapy Interventions by Therapist: CBT, problem solving, EMDR, insight oriented and mindfulness meditation  Patient has approved this treatment plan  Rosslyn Pasion G Areej Tayler, [name]

## 2024-04-01 ENCOUNTER — Ambulatory Visit (INDEPENDENT_AMBULATORY_CARE_PROVIDER_SITE_OTHER): Admitting: Psychology

## 2024-04-01 DIAGNOSIS — F331 Major depressive disorder, recurrent, moderate: Secondary | ICD-10-CM | POA: Diagnosis not present

## 2024-04-01 DIAGNOSIS — F4310 Post-traumatic stress disorder, unspecified: Secondary | ICD-10-CM

## 2024-04-01 DIAGNOSIS — F411 Generalized anxiety disorder: Secondary | ICD-10-CM

## 2024-04-01 NOTE — Progress Notes (Signed)
 [facility] Counselor/Therapist Progress Notes  Patient ID: Lisa Cole, MRN: [id number],    Date: 04/01/2024  Time Spent: 57 minutes  Time in: 5:06 Time out: 6:03  Treatment Type: Individual Therapy  Reported Symptoms: worry, depression, physical pain  Mental Status Exam: Appearance:  Casual  Behavior: normal  Motor: normal  Speech/Language:  normal  Affect: labile  Mood: anxious  Thought process: WNL  Thought content:   WNL  Sensory/Perceptual disturbances:   WNL  Orientation: oriented to person, place, time/date, and situation  Attention: Good  Concentration: Fair  Memory: WNL  Fund of knowledge:  Good  Insight:   Fair  Judgment:  Poor  Impulse Control: Poor   Risk Assessment: Danger to Self:  No Self-injurious Behavior: No Danger to Others: No Duty to Warn:no Physical Aggression / Violence:No  Access to Firearms a concern: No  Gang Involvement:No   Subjective: The patient attended a face-to-face individual therapy session via video visit  today.  The patient gave verbal consent for the session to be on video on caregility and is aware of the limitations of telehealth.  The patient was in her car alone and the therapist was in the office.  The patient presents with a labile affect and her mood is anxious.  She talked about being stressed continuing to deal with her son.  She was able to refocus and seem to do better towards the end of the session.  We talked about her getting that full-time job and it seems like that in the works.  She still lives with her parents and that causes her stress right now but I think what she can get a full-time job she may be able to find a place to live or it and then by place, hopefully.  She also talked about the guy that she was dating coming back into her life and she is not sure how she feels about that we talked about her moving real slow with that situation and she does not need more stressors in her life.  We talked  about ways for her to manage her stress and anxiety and she seemed to be doing better by the time with things the session.   Interventions: Cognitive Behavioral Therapy and Mindfulness Meditation, problem solving and psychoeducation.  Diagnosis:Major depressive disorder, recurrent episode, moderate (HCC)  Generalized anxiety disorder  PTSD (post-traumatic stress disorder)  Plan: Treatment Plan   Strengths/Abilities:  intelligent, motivated   Treatment Preferences: Outpatient Individual therapy  Statement of Needs:  I have anxiety and depression  Symptoms:  Autonomic hyperactivity (e.g., palpitations, shortness of breath, dry mouth, trouble swallowing,  nausea, diarrhea).: (Status: improved). Excessive and/or unrealistic worry that  is difficult to control occurring more days than not for at least 6 months about a number of events or  activities.: (Status: improved). Hypervigilance (e.g., feeling constantly on  edge, experiencing concentration difficulties, having trouble falling or staying asleep, exhibiting a  general state of irritability).: (Status: improved). Motor tension (e.g.,  restlessness, tiredness, shakiness, muscle tension).: (Status: improved).  Problems Addressed:  Depression and Anxiety  Goals:  LTG:1. Enhance ability to effectively cope with the full variety of life's worries  and anxieties.  60% 2. Learn and implement coping skills that result in a reduction of anxiety  and worry, and improved daily functioning  60% 3. Reduce overall frequency, intensity, and duration of the anxiety so that  daily functioning is not impaired.  50% 4. Resolve the core conflict that is the  source of anxiety.  10% 5. Stabilize anxiety level while increasing ability to function on a daily  20% STG: 1. Identify, challenge, and replace biased, fearful self-talk with positive, realistic, and empowering self talk  50% 2. Learn and implement problem-solving strategies for realistically  addressing worries.  50%   3.Learn and implement calming skills to reduce overall anxiety and manage anxiety symptoms.  50%   Target Date:  10/13/2024 Frequency:  weekly to biweekly Modality: Individual therapy Interventions by Therapist: CBT, problem solving, EMDR, insight oriented and mindfulness meditation  Patient has approved this treatment plan  Terrisha Lopata G Yehia Mcbain, [name]

## 2024-04-03 ENCOUNTER — Ambulatory Visit: Admitting: Internal Medicine

## 2024-04-08 ENCOUNTER — Ambulatory Visit (INDEPENDENT_AMBULATORY_CARE_PROVIDER_SITE_OTHER): Admitting: Psychology

## 2024-04-08 DIAGNOSIS — F411 Generalized anxiety disorder: Secondary | ICD-10-CM

## 2024-04-08 DIAGNOSIS — F4310 Post-traumatic stress disorder, unspecified: Secondary | ICD-10-CM

## 2024-04-08 DIAGNOSIS — F331 Major depressive disorder, recurrent, moderate: Secondary | ICD-10-CM

## 2024-04-08 NOTE — Progress Notes (Unsigned)
                edge,  experiencing concentration difficulties, having trouble falling or staying asleep, exhibiting a general  state of irritability).: No Description Entered (Status: improved). Motor tension (e.g., restlessness,  tiredness, shakiness, muscle tension).: No Description Entered (Status: improved).  Problems Addressed  Anxiety, Phase Of Life Problems, Anxiety  Goals 1. Learn and implement coping skills that result in a reduction of anxiety  and worry, and improved daily functioning. Objective Learn  and implement calming skills to reduce overall anxiety and manage anxiety symptoms. Target Date: 2025-08-09Frequency: Weekly Progress: 40 Modality: individual  Related Interventions 1. Teach the client calming/relaxation skills (e.g., applied relaxation, progressive muscle  relaxation, cue controlled relaxation; mindful breathing; biofeedback) and how to discriminate  better between relaxation and tension; teach the client how to apply these skills to his/her daily  life (e.g., New Directions in Progressive Muscle Relaxation by Marcelyn Ditty, and  Hazlett-Stevens; Treating Generalized Anxiety Disorder by Rygh and Ida Rogue). Objective Identify, challenge, and replace biased, fearful self-talk with positive, realistic, and empowering selftalk. Target Date: 2024-05-02 Frequency: weekly Progress: 30 Modality: individual Related Interventions 1. Explore the client's schema and self-talk that mediate his/her fear response; assist him/her in  challenging the biases; replace the distorted messages with reality-based alternatives and  positive, realistic self-talk that will increase his/her self-confidence in coping with irrational  fears (see Cognitive Therapy of Anxiety Disorders by Laurence Slate). Objective Learn and implement problem-solving strategies for realistically addressing worries. Target Date: 2025-08-09Frequency: weekly Progress: 40 Modality: individual 2. Resolve conflicted feelings and adapt to the new life circumstances. Objective Apply problem-solving skills to current circumstances. Target Date: 2024-05-02 Frequency: weekly Progress: 20 Modality: individual Related Interventions 1. Teach the client problem-resolution skills (e.g., defining the problem clearly, brainstorming  multiple solutions, listing the pros and cons of each solution, seeking input from others,  selecting and implementing a plan of action, evaluating outcome, and readjusting plan as   necessary).   3. Stabilize anxiety level while increasing ability to function on a daily  basis. Diagnosis F33.1  Major depressive disorder, moderate 300.02 (Generalized anxiety disorder) - Open - [Signifier: n/a]  Axis  none 309.28 (Adjustment disorder with mixed anxiety and depressed  mood) - Open - [Signifier: n/a]  Adjustment Disorder,  With Anxiety   Marital conflict  Major Depressive disorder, moderate  Conditions For Discharge Achievement of treatment goals and objectives.  The patient approved this plan.   Deonna Krummel G Ethridge Sollenberger, [name]

## 2024-04-14 ENCOUNTER — Ambulatory Visit (INDEPENDENT_AMBULATORY_CARE_PROVIDER_SITE_OTHER): Admitting: Psychology

## 2024-04-14 DIAGNOSIS — F411 Generalized anxiety disorder: Secondary | ICD-10-CM | POA: Diagnosis not present

## 2024-04-14 DIAGNOSIS — F331 Major depressive disorder, recurrent, moderate: Secondary | ICD-10-CM

## 2024-04-14 DIAGNOSIS — F4310 Post-traumatic stress disorder, unspecified: Secondary | ICD-10-CM | POA: Diagnosis not present

## 2024-04-14 NOTE — Progress Notes (Unsigned)
 [facility] Counselor/Therapist Progress Notes  Patient ID: Gerry MARIAGUADALUPE FIALKOWSKI, MRN: [id number],    Date: 04/14/2024  Time Spent: 53 minutes  Time in: 4:07 Time out: 5:00  Treatment Type: Individual Therapy  Reported Symptoms: worry, depression, physical pain  Mental Status Exam: Appearance:  Casual  Behavior: normal  Motor: normal  Speech/Language:  normal  Affect: appropriate  Mood: pleasant  Thought process: WNL  Thought content:   WNL  Sensory/Perceptual disturbances:   WNL  Orientation: oriented to person, place, time/date, and situation  Attention: Good  Concentration: Fair  Memory: WNL  Fund of knowledge:  Good  Insight:   Fair  Judgment:  Poor  Impulse Control: Poor   Risk Assessment: Danger to Self:  No Self-injurious Behavior: No Danger to Others: No Duty to Warn:no Physical Aggression / Violence:No  Access to Firearms a concern: No  Gang Involvement:No   Subjective: The patient attended a face-to-face individual therapy session via video visit  today.  The patient gave verbal consent for the session to be on video on caregility and is aware of the limitations of telehealth.  The patient was in her car alone and the therapist was in the office.  She presents as pleasant and cooperative.  The patient reports that she was chosen for that full-time job at  [employer] and she is extremely excited about that.  She is excited because she will be able to get her own place and be able to move out from her parents home.  The patient also reports that she done a better job of setting limits with [name] and does not seem to be as stressed out about him texting all the time.  In addition she has set some limits with her nurse friend that keeps trying to get back together with her.  She seems to be doing well today and we will continue to work with her on helping her problem solve and make good decisions for her life.  Interventions: Cognitive Behavioral Therapy  and Mindfulness Meditation, problem solving and psychoeducation.  Diagnosis:Major depressive disorder, recurrent episode, moderate (HCC)  Generalized anxiety disorder  PTSD (post-traumatic stress disorder)  Plan: Treatment Plan   Strengths/Abilities:  intelligent, motivated   Treatment Preferences: Outpatient Individual therapy  Statement of Needs:  I have anxiety and depression  Symptoms:  Autonomic hyperactivity (e.g., palpitations, shortness of breath, dry mouth, trouble swallowing,  nausea, diarrhea).: (Status: improved). Excessive and/or unrealistic worry that  is difficult to control occurring more days than not for at least 6 months about a number of events or  activities.: (Status: improved). Hypervigilance (e.g., feeling constantly on  edge, experiencing concentration difficulties, having trouble falling or staying asleep, exhibiting a  general state of irritability).: (Status: improved). Motor tension (e.g.,  restlessness, tiredness, shakiness, muscle tension).: (Status: improved).  Problems Addressed:  Depression and Anxiety  Goals:  LTG:1. Enhance ability to effectively cope with the full variety of life's worries  and anxieties.  60% 2. Learn and implement coping skills that result in a reduction of anxiety  and worry, and improved daily functioning  60% 3. Reduce overall frequency, intensity, and duration of the anxiety so that  daily functioning is not impaired.  50% 4. Resolve the core conflict that is the source of anxiety.  10% 5. Stabilize anxiety level while increasing ability to function on a daily  20% STG: 1. Identify, challenge, and replace biased, fearful self-talk with positive, realistic, and empowering self talk  50% 2. Learn  and implement problem-solving strategies for realistically addressing worries.  50%   3.Learn and implement calming skills to reduce overall anxiety and manage anxiety symptoms.  50%   Target Date:  10/13/2024 Frequency:   weekly to biweekly Modality: Individual therapy Interventions by Therapist: CBT, problem solving, EMDR, insight oriented and mindfulness meditation  Patient has approved this treatment plan  Clatie Kessen G Olivianna Higley, [name]

## 2024-04-24 ENCOUNTER — Ambulatory Visit: Admitting: Psychology

## 2024-04-24 NOTE — Progress Notes (Signed)
 Patient cancelled               Joann Jorge KANDICE Macintosh, [name]

## 2024-04-29 ENCOUNTER — Ambulatory Visit (INDEPENDENT_AMBULATORY_CARE_PROVIDER_SITE_OTHER): Admitting: Psychology

## 2024-04-29 DIAGNOSIS — F4310 Post-traumatic stress disorder, unspecified: Secondary | ICD-10-CM

## 2024-04-29 DIAGNOSIS — F411 Generalized anxiety disorder: Secondary | ICD-10-CM

## 2024-04-29 DIAGNOSIS — F331 Major depressive disorder, recurrent, moderate: Secondary | ICD-10-CM

## 2024-04-29 NOTE — Progress Notes (Signed)
 [facility] Counselor/Therapist Progress Notes  Patient ID: Lisa Cole, MRN: [id number],    Date: 04/29/2024  Time Spent: 54 minutes  Time in: 11:06 Time out: 12:00  Treatment Type: Individual Therapy  Reported Symptoms: worry, depression, physical pain  Mental Status Exam: Appearance:  Casual  Behavior: normal  Motor: normal  Speech/Language:  normal  Affect: appropriate  Mood: pleasant  Thought process: WNL  Thought content:   WNL  Sensory/Perceptual disturbances:   WNL  Orientation: oriented to person, place, time/date, and situation  Attention: Good  Concentration: Fair  Memory: WNL  Fund of knowledge:  Good  Insight:   Fair  Judgment:  Poor  Impulse Control: Poor   Risk Assessment: Danger to Self:  No Self-injurious Behavior: No Danger to Others: No Duty to Warn:no Physical Aggression / Violence:No  Access to Firearms a concern: No  Gang Involvement:No   Subjective: The patient attended a face-to-face individual therapy session via video visit  today.  The patient gave verbal consent for the session to be on video on caregility and is aware of the limitations of telehealth.  The patient was in her car alone and the therapist was in the office.  She presents as pleasant and cooperative.  The patient ended up having to finish the session on the telephone because her phone got hot.  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  In addition she reports that she starts her new job with [employer] and is very happy about that as well.  She also reports that she has a new real estate agent and is hoping to be able to move toward purchasing a home later in the year.  The patient seems to be managing things much better and seems to be handling her stress in a better way.   Interventions: Cognitive  Behavioral Therapy and Mindfulness Meditation, problem solving and psychoeducation.  Diagnosis:Major depressive disorder, recurrent episode, moderate (HCC)  Generalized anxiety disorder  PTSD (post-traumatic stress disorder)  Plan: Treatment Plan   Strengths/Abilities:  intelligent, motivated   Treatment Preferences: Outpatient Individual therapy  Statement of Needs:  I have anxiety and depression  Symptoms:  Autonomic hyperactivity (e.g., palpitations, shortness of breath, dry mouth, trouble swallowing,  nausea, diarrhea).: (Status: improved). Excessive and/or unrealistic worry that  is difficult to control occurring more days than not for at least 6 months about a number of events or  activities.: (Status: improved). Hypervigilance (e.g., feeling constantly on  edge, experiencing concentration difficulties, having trouble falling or staying asleep, exhibiting a  general state of irritability).: (Status: improved). Motor tension (e.g.,  restlessness, tiredness, shakiness, muscle tension).: (Status: improved).  Problems Addressed:  Depression and Anxiety  Goals:  LTG:1. Enhance ability to effectively cope with the full variety of life's worries  and anxieties.  60% 2. Learn and implement coping skills that result in a reduction of anxiety  and worry, and improved daily functioning  60% 3. Reduce overall frequency, intensity, and duration of the anxiety so that  daily functioning is not impaired.  50% 4. Resolve the core conflict that is the source of anxiety.  10% 5. Stabilize anxiety level while increasing ability to function on a daily  20% STG: 1. Identify, challenge, and replace biased, fearful self-talk with positive, realistic, and empowering self talk  50% 2. Learn and implement problem-solving strategies for realistically addressing worries.  50%   3.Learn and implement calming skills to reduce overall anxiety and manage anxiety symptoms.  50%   Target Date:   10/13/2024 Frequency:  weekly to biweekly Modality: Individual therapy Interventions by Therapist: CBT, problem solving, EMDR, insight oriented and mindfulness meditation  Patient has approved this treatment plan  Myrle Dues G Verda Mehta, [name]

## 2024-05-15 ENCOUNTER — Ambulatory Visit: Admitting: Psychology

## 2024-05-15 NOTE — Progress Notes (Signed)
 cancelled               [employer], [name]

## 2024-05-21 ENCOUNTER — Other Ambulatory Visit: Payer: Self-pay | Admitting: Internal Medicine

## 2024-05-22 ENCOUNTER — Other Ambulatory Visit (HOSPITAL_COMMUNITY): Payer: Self-pay

## 2024-05-22 ENCOUNTER — Telehealth: Payer: Self-pay

## 2024-05-22 NOTE — Telephone Encounter (Signed)
 Pharmacy Patient Advocate Encounter   Received notification from RX Request Messages that prior authorization for Dexcom g7 sensor is required/requested.   Insurance verification completed.   The patient is insured through [employer] .   Per test claim: Medication is not eligible for pharmacy benefits and must be billed through medical insurance. As our team only handles pharmacy related prior auths, medical PA's must be submitted by the clinic. Thank you

## 2024-06-02 NOTE — Telephone Encounter (Signed)
 Lisa Cole

## 2024-06-05 ENCOUNTER — Encounter: Payer: Self-pay | Admitting: Internal Medicine

## 2024-06-05 ENCOUNTER — Other Ambulatory Visit: Payer: Self-pay | Admitting: Internal Medicine

## 2024-06-05 ENCOUNTER — Ambulatory Visit (INDEPENDENT_AMBULATORY_CARE_PROVIDER_SITE_OTHER): Admitting: Internal Medicine

## 2024-06-05 VITALS — BP 120/70 | HR 91 | Ht 60.0 in | Wt 159.4 lb

## 2024-06-05 DIAGNOSIS — E7849 Other hyperlipidemia: Secondary | ICD-10-CM | POA: Diagnosis not present

## 2024-06-05 DIAGNOSIS — E063 Autoimmune thyroiditis: Secondary | ICD-10-CM

## 2024-06-05 DIAGNOSIS — E1165 Type 2 diabetes mellitus with hyperglycemia: Secondary | ICD-10-CM | POA: Diagnosis not present

## 2024-06-05 DIAGNOSIS — Z794 Long term (current) use of insulin: Secondary | ICD-10-CM

## 2024-06-05 LAB — POCT GLYCOSYLATED HEMOGLOBIN (HGB A1C): Hemoglobin A1C: 8.5 % — AB (ref 4.0–5.6)

## 2024-06-05 MED ORDER — ACCU-CHEK GUIDE W/DEVICE KIT: PACK | 0 refills | Status: AC

## 2024-06-05 MED ORDER — ACCU-CHEK SOFTCLIX LANCETS MISC: 3 refills | Status: AC

## 2024-06-05 MED ORDER — GLUCOSE BLOOD VI STRP: ORAL_STRIP | 3 refills | Status: AC

## 2024-06-05 MED ORDER — TRESIBA FLEXTOUCH 200 UNIT/ML ~~LOC~~ SOPN: 20.0000 [IU] | PEN_INJECTOR | Freq: Every day | SUBCUTANEOUS | 3 refills | Status: DC

## 2024-06-05 MED ORDER — TIRZEPATIDE 7.5 MG/0.5ML ~~LOC~~ SOAJ: 7.5000 mg | SUBCUTANEOUS | 3 refills | Status: AC
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
## 2024-06-05 NOTE — Patient Instructions (Addendum)
 Please start: - Tresiba  12 units daily for 3 days, and increase by 2 units every 2-3 days until sugars in am are <130  Please increase: - Mounjaro  7.5 mg weekly   Please return in 3- months.

## 2024-06-06 ENCOUNTER — Ambulatory Visit: Payer: Self-pay | Admitting: Internal Medicine

## 2024-06-06 LAB — COMPREHENSIVE METABOLIC PANEL WITH GFR
AG Ratio: 1.7 (calc) (ref 1.0–2.5)
ALT: 18 U/L (ref 6–29)
AST: 15 U/L (ref 10–30)
Albumin: 4.8 g/dL (ref 3.6–5.1)
Alkaline phosphatase (APISO): 44 U/L (ref 31–125)
BUN: 11 mg/dL (ref 7–25)
CO2: 26 mmol/L (ref 20–32)
Calcium: 10 mg/dL (ref 8.6–10.2)
Chloride: 105 mmol/L (ref 98–110)
Creat: 0.72 mg/dL (ref 0.50–0.99)
Globulin: 2.9 g/dL (ref 1.9–3.7)
Glucose, Bld: 133 mg/dL — ABNORMAL HIGH (ref 65–99)
Potassium: 4.4 mmol/L (ref 3.5–5.3)
Sodium: 142 mmol/L (ref 135–146)
Total Bilirubin: 0.5 mg/dL (ref 0.2–1.2)
Total Protein: 7.7 g/dL (ref 6.1–8.1)
eGFR: 106 mL/min/1.73m2 (ref >=60)

## 2024-06-06 LAB — LIPID PANEL W/REFLEX DIRECT LDL
Cholesterol: 137 mg/dL (ref <200)
HDL: 43 mg/dL — ABNORMAL LOW (ref >=50)
LDL Cholesterol (Calc): 75 mg/dL
Non-HDL Cholesterol (Calc): 94 mg/dL (ref <130)
Total CHOL/HDL Ratio: 3.2 (calc) (ref <5.0)
Triglycerides: 109 mg/dL (ref <150)

## 2024-06-06 LAB — MICROALBUMIN / CREATININE URINE RATIO
Creatinine, Urine: 194 mg/dL (ref 20–275)
Microalb Creat Ratio: 7 mg/g{creat} (ref <30)
Microalb, Ur: 1.3 mg/dL

## 2024-06-06 LAB — TSH: TSH: 0.77 m[IU]/L

## 2024-06-09 ENCOUNTER — Other Ambulatory Visit (HOSPITAL_COMMUNITY): Payer: Self-pay

## 2024-06-09 ENCOUNTER — Telehealth: Payer: Self-pay

## 2024-06-09 NOTE — Telephone Encounter (Signed)
 Pharmacy Patient Advocate Encounter   Received notification from RX Request Messages that prior authorization for Tresiba  is required/requested.   Insurance verification completed.   The patient is insured through [facility] and [place].   Per test claim:  Lantus  or Toujeo  is preferred by the insurance.  If suggested medication is appropriate, Please send in a new RX and discontinue this one. If not, please advise as to why it's not appropriate so that we may request a Prior Authorization. Please note, some preferred medications may still require a PA.  If the suggested medications have not been trialed and there are no contraindications to their use, the PA will not be submitted, as it will not be approved.

## 2024-06-10 ENCOUNTER — Ambulatory Visit: Admitting: Psychology

## 2024-06-10 DIAGNOSIS — F331 Major depressive disorder, recurrent, moderate: Secondary | ICD-10-CM

## 2024-06-10 DIAGNOSIS — F4310 Post-traumatic stress disorder, unspecified: Secondary | ICD-10-CM | POA: Diagnosis not present

## 2024-06-10 DIAGNOSIS — F411 Generalized anxiety disorder: Secondary | ICD-10-CM

## 2024-06-10 NOTE — Progress Notes (Unsigned)
                edge,  experiencing concentration difficulties, having trouble falling or staying asleep, exhibiting a general  state of irritability).: No Description Entered (Status: improved). Motor tension (e.g., restlessness,  tiredness, shakiness, muscle tension).: No Description Entered (Status: improved).  Problems Addressed  Anxiety, Phase Of Life Problems, Anxiety  Goals 1. Learn and implement coping skills that result in a reduction of anxiety  and worry, and improved daily functioning. Objective Learn  and implement calming skills to reduce overall anxiety and manage anxiety symptoms. Target Date: 2025-08-09Frequency: Weekly Progress: 40 Modality: individual  Related Interventions 1. Teach the client calming/relaxation skills (e.g., applied relaxation, progressive muscle  relaxation, cue controlled relaxation; mindful breathing; biofeedback) and how to discriminate  better between relaxation and tension; teach the client how to apply these skills to his/her daily  life (e.g., New Directions in Progressive Muscle Relaxation by Marcelyn Ditty, and  Hazlett-Stevens; Treating Generalized Anxiety Disorder by Rygh and Ida Rogue). Objective Identify, challenge, and replace biased, fearful self-talk with positive, realistic, and empowering selftalk. Target Date: 2024-05-02 Frequency: weekly Progress: 30 Modality: individual Related Interventions 1. Explore the client's schema and self-talk that mediate his/her fear response; assist him/her in  challenging the biases; replace the distorted messages with reality-based alternatives and  positive, realistic self-talk that will increase his/her self-confidence in coping with irrational  fears (see Cognitive Therapy of Anxiety Disorders by Laurence Slate). Objective Learn and implement problem-solving strategies for realistically addressing worries. Target Date: 2025-08-09Frequency: weekly Progress: 40 Modality: individual 2. Resolve conflicted feelings and adapt to the new life circumstances. Objective Apply problem-solving skills to current circumstances. Target Date: 2024-05-02 Frequency: weekly Progress: 20 Modality: individual Related Interventions 1. Teach the client problem-resolution skills (e.g., defining the problem clearly, brainstorming  multiple solutions, listing the pros and cons of each solution, seeking input from others,  selecting and implementing a plan of action, evaluating outcome, and readjusting plan as   necessary).   3. Stabilize anxiety level while increasing ability to function on a daily  basis. Diagnosis F33.1  Major depressive disorder, moderate 300.02 (Generalized anxiety disorder) - Open - [Signifier: n/a]  Axis  none 309.28 (Adjustment disorder with mixed anxiety and depressed  mood) - Open - [Signifier: n/a]  Adjustment Disorder,  With Anxiety   Marital conflict  Major Depressive disorder, moderate  Conditions For Discharge Achievement of treatment goals and objectives.  The patient approved this plan.   Deonna Krummel G Ethridge Sollenberger, [name]

## 2024-06-12 MED ORDER — TOUJEO SOLOSTAR 300 UNIT/ML ~~LOC~~ SOPN: 20.0000 [IU] | PEN_INJECTOR | Freq: Every day | SUBCUTANEOUS | 3 refills | Status: AC

## 2024-06-12 NOTE — Telephone Encounter (Signed)
 Me to Lisa Cole    06/12/24  8:22 AM Good morning, We received a message from your pharmacy stating that we needed to change your Tresiba  to another insulin ,so Dr. Trixie agreed to switch you to Toujeo  same instructions as your tresiba .   Outpatient Medication Detail   Disp Refills Start End   insulin  glargine, 1 Unit Dial, (TOUJEO  SOLOSTAR) 300 UNIT/ML Solostar Pen 9 mL 3 06/12/2024 --   Sig - Route: Inject 20 Units into the skin daily. - Subcutaneous   Sent to pharmacy as: insulin  glargine, 1 Unit Dial, (TOUJEO  SOLOSTAR) 300 UNIT/ML Solostar Pen   E-Prescribing Status: Receipt confirmed by pharmacy (06/12/2024  8:19 AM EDT)    Pharmacy  [id number] - [place], [place] - [address]

## 2024-06-30 ENCOUNTER — Ambulatory Visit (INDEPENDENT_AMBULATORY_CARE_PROVIDER_SITE_OTHER): Admitting: Psychology

## 2024-06-30 DIAGNOSIS — F4310 Post-traumatic stress disorder, unspecified: Secondary | ICD-10-CM

## 2024-06-30 DIAGNOSIS — F411 Generalized anxiety disorder: Secondary | ICD-10-CM | POA: Diagnosis not present

## 2024-06-30 DIAGNOSIS — F331 Major depressive disorder, recurrent, moderate: Secondary | ICD-10-CM

## 2024-06-30 NOTE — Progress Notes (Unsigned)
 [facility] Counselor/Therapist Progress Notes  Patient ID: Josely JYLA HOPF, MRN: [id number],    Date: 06/30/2024  Time Spent:55 minutes  Time in: 9:05 Time out: 10:00  Treatment Type: Individual Therapy  Reported Symptoms: worry, depression, physical pain  Mental Status Exam: Appearance:  Casual  Behavior: normal  Motor: normal  Speech/Language:  normal  Affect: depressed  Mood: sad  Thought process: WNL  Thought content:   WNL  Sensory/Perceptual disturbances:   WNL  Orientation: oriented to person, place, time/date, and situation  Attention: Good  Concentration: Fair  Memory: WNL  Fund of knowledge:  Good  Insight:   Fair  Judgment:  Poor  Impulse Control: Poor   Risk Assessment: Danger to Self:  No Self-injurious Behavior: No Danger to Others: No Duty to Warn:no Physical Aggression / Violence:No  Access to Firearms a concern: No  Gang Involvement:No   Subjective: The patient attended a face-to-face individual therapy session via video visit  today.  The patient gave verbal consent for the session to be on video on caregility and is aware of the limitations of telehealth.  The patient was in her car alone and the therapist was in the office.  The patient presents as depressed and sad.  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  She went down the path of being discriminated against and talked about that she was approved for the loan she just could not get 100% financing.  We talked about the need to change her narrative in her months.  We talked about getting a house is a difficult thing anyway and that probably the difficulty is trying to get 100% finance.  The patient was able to reframe and think about things in a different way after we had the conversation.  We talked about getting alone is a difficult thing but she was  approved for alone already the problem was that she wanted to 100% financing and has not been able to get that.  She states that right now she does not have the money to put down on a house and we talked about different ways that she could achieve the goal of getting a house if she could save enough money in order to have money to put down.  The patient was much more appreciative and glad that she had talked about changing her thought process.   Interventions: Cognitive Behavioral Therapy and Mindfulness Meditation, problem solving and psychoeducation.  Diagnosis:Major depressive disorder, recurrent episode, moderate (HCC)  Generalized anxiety disorder  PTSD (post-traumatic stress disorder)  Plan: Treatment Plan   Strengths/Abilities:  intelligent, motivated   Treatment Preferences: Outpatient Individual therapy  Statement of Needs:  I have anxiety and depression  Symptoms:  Autonomic hyperactivity (e.g., palpitations, shortness of breath, dry mouth, trouble swallowing,  nausea, diarrhea).: (Status: improved). Excessive and/or unrealistic worry that  is difficult to control occurring more days than not for at least 6 months about a number of events or  activities.: (Status: improved). Hypervigilance (e.g., feeling constantly on  edge, experiencing concentration difficulties, having trouble falling or staying asleep, exhibiting a  general state of irritability).: (Status: improved). Motor tension (e.g.,  restlessness, tiredness, shakiness, muscle tension).: (Status: improved).  Problems Addressed:  Depression and Anxiety  Goals:  LTG:1. Enhance ability to effectively cope with the full variety of life's worries  and anxieties.  60% 2. Learn and implement coping skills that result in a reduction of anxiety  and worry, and improved daily functioning  60% 3. Reduce overall frequency, intensity, and duration of the anxiety so that  daily functioning is not impaired.  50% 4. Resolve  the core conflict that is the source of anxiety.  10% 5. Stabilize anxiety level while increasing ability to function on a daily  20% STG: 1. Identify, challenge, and replace biased, fearful self-talk with positive, realistic, and empowering self talk  50% 2. Learn and implement problem-solving strategies for realistically addressing worries.  50%   3.Learn and implement calming skills to reduce overall anxiety and manage anxiety symptoms.  50%   Target Date:  10/13/2024 Frequency:  weekly to biweekly Modality: Individual therapy Interventions by Therapist: CBT, problem solving, EMDR, insight oriented and mindfulness meditation  Patient has approved this treatment plan  Dalin Caldera G Delma Drone, [name]

## 2024-07-07 ENCOUNTER — Ambulatory Visit: Admitting: Psychology

## 2024-07-07 DIAGNOSIS — F4310 Post-traumatic stress disorder, unspecified: Secondary | ICD-10-CM | POA: Diagnosis not present

## 2024-07-07 DIAGNOSIS — F411 Generalized anxiety disorder: Secondary | ICD-10-CM

## 2024-07-07 DIAGNOSIS — F331 Major depressive disorder, recurrent, moderate: Secondary | ICD-10-CM | POA: Diagnosis not present

## 2024-07-07 NOTE — Progress Notes (Unsigned)
 [facility] Counselor/Therapist Progress Notes  Patient ID: Lisa Cole, MRN: [id number],    Date: 07/07/2024  Time Spent:60 minutes  Time in: 4:05 Time out: 5:05  Treatment Type: Individual Therapy  Reported Symptoms: worry, depression, physical pain  Mental Status Exam: Appearance:  Casual  Behavior: normal  Motor: normal  Speech/Language:  normal  Affect: depressed  Mood: sad  Thought process: WNL  Thought content:   WNL  Sensory/Perceptual disturbances:   WNL  Orientation: oriented to person, place, time/date, and situation  Attention: Good  Concentration: Fair  Memory: WNL  Fund of knowledge:  Good  Insight:   Fair  Judgment:  Poor  Impulse Control: Poor   Risk Assessment: Danger to Self:  No Self-injurious Behavior: No Danger to Others: No Duty to Warn:no Physical Aggression / Violence:No  Access to Firearms a concern: No  Gang Involvement:No   Subjective: The patient attended a face-to-face individual therapy session via video visit  today.  The patient gave verbal consent for the session to be on video on caregility and is aware of the limitations of telehealth.  The patient was in her car alone and the therapist was in the office.  The patient presents as depressed and sad.  The patient does seem to be in a better place with the fact that she cannot get a house right now.  She has gotten more toward acceptance of waiting until she can put a down payment on the house when she gets a settlement from a lawsuit that she is involved with.  In addition we talked today about the stress that she is going through living in the house with her parents and their dysfunction.  Apparently her mother has surgery scheduled tomorrow and she is afraid that she might have cancer.  We talked about keeping herself calm and waiting until she knows what the answer to that is and that she does not need to get involved in the drama between her mother and her father.  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  Interventions: Cognitive Behavioral Therapy and Mindfulness Meditation, problem solving and psychoeducation.  Diagnosis:Major depressive disorder, recurrent episode, moderate (HCC)  Generalized anxiety disorder  PTSD (post-traumatic stress disorder)  Plan: Treatment Plan   Strengths/Abilities:  intelligent, motivated   Treatment Preferences: Outpatient Individual therapy  Statement of Needs:  I have anxiety and depression  Symptoms:  Autonomic hyperactivity (e.g., palpitations, shortness of breath, dry mouth, trouble swallowing,  nausea, diarrhea).: (Status: improved). Excessive and/or unrealistic worry that  is difficult to control occurring more days than not for at least 6 months about a number of events or  activities.: (Status: improved). Hypervigilance (e.g., feeling constantly on  edge, experiencing concentration difficulties, having trouble falling or staying asleep, exhibiting a  general state of irritability).: (Status: improved). Motor tension (e.g.,  restlessness, tiredness, shakiness, muscle tension).: (Status: improved).  Problems Addressed:  Depression and Anxiety  Goals:  LTG:1. Enhance ability to effectively cope with the full variety of life's worries  and anxieties.  60% 2. Learn and implement coping skills that result in a reduction of anxiety  and worry, and improved daily functioning  60% 3. Reduce overall frequency, intensity, and duration of the anxiety so that  daily functioning is not impaired.  50% 4. Resolve the core conflict that is  the source of anxiety.  10% 5. Stabilize anxiety level while increasing ability to function on a daily  20% STG: 1. Identify, challenge, and replace biased, fearful self-talk with positive, realistic, and empowering self talk   50% 2. Learn and implement problem-solving strategies for realistically addressing worries.  50%   3.Learn and implement calming skills to reduce overall anxiety and manage anxiety symptoms.  50%   Target Date:  10/13/2024 Frequency:  weekly to biweekly Modality: Individual therapy Interventions by Therapist: CBT, problem solving, EMDR, insight oriented and mindfulness meditation  Patient has approved this treatment plan  Marina Desire G Kareen Jefferys, [name]

## 2024-07-10 ENCOUNTER — Ambulatory Visit: Admitting: Internal Medicine

## 2024-07-14 ENCOUNTER — Ambulatory Visit: Admitting: Psychology

## 2024-07-14 DIAGNOSIS — F411 Generalized anxiety disorder: Secondary | ICD-10-CM

## 2024-07-14 DIAGNOSIS — F331 Major depressive disorder, recurrent, moderate: Secondary | ICD-10-CM

## 2024-07-14 DIAGNOSIS — F4310 Post-traumatic stress disorder, unspecified: Secondary | ICD-10-CM | POA: Diagnosis not present

## 2024-07-14 NOTE — Progress Notes (Unsigned)
 [facility] Counselor/Therapist Progress Notes  Patient ID: Lisa Cole, MRN: [id number],    Date: 07/14/2024  Time Spent:62 minutes  Time in: 4:07 Time out: 5:05  Treatment Type: Individual Therapy  Reported Symptoms: worry, depression, physical pain  Mental Status Exam: Appearance:  Casual  Behavior: normal  Motor: normal  Speech/Language:  normal  Affect: anxious  Mood: anxious  Thought process: WNL  Thought content:   WNL  Sensory/Perceptual disturbances:   WNL  Orientation: oriented to person, place, time/date, and situation  Attention: Good  Concentration: Fair  Memory: WNL  Fund of knowledge:  Good  Insight:   Fair  Judgment:  Poor  Impulse Control: Poor   Risk Assessment: Danger to Self:  No Self-injurious Behavior: No Danger to Others: No Duty to Warn:no Physical Aggression / Violence:No  Access to Firearms a concern: No  Gang Involvement:No   Subjective: The patient attended a face-to-face individual therapy session via video visit  today.  The patient gave verbal consent for the session to be on video on caregility and is aware of the limitations of telehealth.  The patient was in her car alone and the therapist was in the office.  The patient presents as somewhat anxious today.  The patient reports that she is dealing with her mothers diagnosis of cancer and they still do not know whether the cancer is outside of the uterus or not.  She reports that her mother is not coping well with that and that affects her because she is living in the home with her parents.  She also is dealing with her son, who is somewhat psychotic but not dangerous.  We talked about her encouraging him to take the medicine that he needs to take.  She does seem to be managing things okay right now but she is somewhat anxious.  I have seen her struggling more with being able to cope in the past and she seems to be managing her stress right now.  Interventions: Cognitive  Behavioral Therapy and Mindfulness Meditation, problem solving and psychoeducation.  Diagnosis:Major depressive disorder, recurrent episode, moderate (HCC)  Generalized anxiety disorder  PTSD (post-traumatic stress disorder)  Plan: Treatment Plan   Strengths/Abilities:  intelligent, motivated   Treatment Preferences: Outpatient Individual therapy  Statement of Needs:  I have anxiety and depression  Symptoms:  Autonomic hyperactivity (e.g., palpitations, shortness of breath, dry mouth, trouble swallowing,  nausea, diarrhea).: (Status: improved). Excessive and/or unrealistic worry that  is difficult to control occurring more days than not for at least 6 months about a number of events or  activities.: (Status: improved). Hypervigilance (e.g., feeling constantly on  edge, experiencing concentration difficulties, having trouble falling or staying asleep, exhibiting a  general state of irritability).: (Status: improved). Motor tension (e.g.,  restlessness, tiredness, shakiness, muscle tension).: (Status: improved).  Problems Addressed:  Depression and Anxiety  Goals:  LTG:1. Enhance ability to effectively cope with the full variety of life's worries  and anxieties.  60% 2. Learn and implement coping skills that result in a reduction of anxiety  and worry, and improved daily functioning  60% 3. Reduce overall frequency, intensity, and duration of the anxiety so that  daily functioning is not impaired.  50% 4. Resolve the core conflict that is the source of anxiety.  10% 5. Stabilize anxiety level while increasing ability to function on a daily  20% STG: 1. Identify, challenge, and replace biased, fearful self-talk with positive, realistic, and empowering self talk  50% 2. Learn  and implement problem-solving strategies for realistically addressing worries.  50%   3.Learn and implement calming skills to reduce overall anxiety and manage anxiety symptoms.  50%   Target Date:   10/13/2024 Frequency:  weekly to biweekly Modality: Individual therapy Interventions by Therapist: CBT, problem solving, EMDR, insight oriented and mindfulness meditation  Patient has approved this treatment plan  Atif Chapple [name], [name]

## 2024-07-21 ENCOUNTER — Ambulatory Visit (INDEPENDENT_AMBULATORY_CARE_PROVIDER_SITE_OTHER): Admitting: Psychology

## 2024-07-21 DIAGNOSIS — F411 Generalized anxiety disorder: Secondary | ICD-10-CM | POA: Diagnosis not present

## 2024-07-21 DIAGNOSIS — F4310 Post-traumatic stress disorder, unspecified: Secondary | ICD-10-CM

## 2024-07-21 DIAGNOSIS — F331 Major depressive disorder, recurrent, moderate: Secondary | ICD-10-CM | POA: Diagnosis not present

## 2024-07-21 NOTE — Progress Notes (Unsigned)
                edge,  experiencing concentration difficulties, having trouble falling or staying asleep, exhibiting a general  state of irritability).: No Description Entered (Status: improved). Motor tension (e.g., restlessness,  tiredness, shakiness, muscle tension).: No Description Entered (Status: improved).  Problems Addressed  Anxiety, Phase Of Life Problems, Anxiety  Goals 1. Learn and implement coping skills that result in a reduction of anxiety  and worry, and improved daily functioning. Objective Learn  and implement calming skills to reduce overall anxiety and manage anxiety symptoms. Target Date: 2025-08-09Frequency: Weekly Progress: 40 Modality: individual  Related Interventions 1. Teach the client calming/relaxation skills (e.g., applied relaxation, progressive muscle  relaxation, cue controlled relaxation; mindful breathing; biofeedback) and how to discriminate  better between relaxation and tension; teach the client how to apply these skills to his/her daily  life (e.g., New Directions in Progressive Muscle Relaxation by Marcelyn Ditty, and  Hazlett-Stevens; Treating Generalized Anxiety Disorder by Rygh and Ida Rogue). Objective Identify, challenge, and replace biased, fearful self-talk with positive, realistic, and empowering selftalk. Target Date: 2024-05-02 Frequency: weekly Progress: 30 Modality: individual Related Interventions 1. Explore the client's schema and self-talk that mediate his/her fear response; assist him/her in  challenging the biases; replace the distorted messages with reality-based alternatives and  positive, realistic self-talk that will increase his/her self-confidence in coping with irrational  fears (see Cognitive Therapy of Anxiety Disorders by Laurence Slate). Objective Learn and implement problem-solving strategies for realistically addressing worries. Target Date: 2025-08-09Frequency: weekly Progress: 40 Modality: individual 2. Resolve conflicted feelings and adapt to the new life circumstances. Objective Apply problem-solving skills to current circumstances. Target Date: 2024-05-02 Frequency: weekly Progress: 20 Modality: individual Related Interventions 1. Teach the client problem-resolution skills (e.g., defining the problem clearly, brainstorming  multiple solutions, listing the pros and cons of each solution, seeking input from others,  selecting and implementing a plan of action, evaluating outcome, and readjusting plan as   necessary).   3. Stabilize anxiety level while increasing ability to function on a daily  basis. Diagnosis F33.1  Major depressive disorder, moderate 300.02 (Generalized anxiety disorder) - Open - [Signifier: n/a]  Axis  none 309.28 (Adjustment disorder with mixed anxiety and depressed  mood) - Open - [Signifier: n/a]  Adjustment Disorder,  With Anxiety   Marital conflict  Major Depressive disorder, moderate  Conditions For Discharge Achievement of treatment goals and objectives.  The patient approved this plan.   Deonna Krummel G Ethridge Sollenberger, [name]

## 2024-07-28 ENCOUNTER — Ambulatory Visit: Admitting: Psychology

## 2024-08-07 ENCOUNTER — Ambulatory Visit: Admitting: Psychology

## 2024-08-07 DIAGNOSIS — F331 Major depressive disorder, recurrent, moderate: Secondary | ICD-10-CM | POA: Diagnosis not present

## 2024-08-07 DIAGNOSIS — F4310 Post-traumatic stress disorder, unspecified: Secondary | ICD-10-CM

## 2024-08-07 DIAGNOSIS — F411 Generalized anxiety disorder: Secondary | ICD-10-CM

## 2024-08-07 NOTE — Progress Notes (Signed)
 [facility] Counselor/Therapist Progress Notes  Patient ID: Lisa Cole, MRN: [id number],    Date: 08/07/2024  Time Spent:53 minutes  Time in: 2:07 Time out: 3:00  Treatment Type: Individual Therapy  Reported Symptoms: worry, depression, physical pain  Mental Status Exam: Appearance:  Casual  Behavior: normal  Motor: normal  Speech/Language:  normal  Affect: pleasant  Mood: pleasant  Thought process: WNL  Thought content:   WNL  Sensory/Perceptual disturbances:   WNL  Orientation: oriented to person, place, time/date, and situation  Attention: Good  Concentration: Fair  Memory: WNL  Fund of knowledge:  Good  Insight:   Fair  Judgment:  Poor  Impulse Control: Poor   Risk Assessment: Danger to Self:  No Self-injurious Behavior: No Danger to Others: No Duty to Warn:no Physical Aggression / Violence:No  Access to Firearms a concern: No  Gang Involvement:No   Subjective: The patient attended a face-to-face individual therapy session via video visit  today.  The patient gave verbal consent for the session to be on video on caregility and is aware of the limitations of telehealth.  The patient was in her car alone and the therapist was in the office.  The patient presents as pleasant and cooperative.  The patient reports that she stayed out of work today and does report that she was having difficulty dealing with building her son.  We talked about her setting limits and boundaries and that that would be a good thing for her to do.  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  We talked about what she wants to do after I retire and I recommended that she could see a clinician in our department and she was looking her up and felt that she was going to be able to connect with her.  I recommended that she contact  her insurance provider to see who might take her insurance but also that she consider looking people up on the Internet as she has done in previous times to be able to find somebody that she feels like she can connect with.  I did offer her options and told her that I would be happy to help her get connected with someone in our office if she wanted to do that.   Interventions: Cognitive Behavioral Therapy and Mindfulness Meditation, problem solving and psychoeducation.  Diagnosis:Major depressive disorder, recurrent episode, moderate (HCC)  Generalized anxiety disorder  PTSD (post-traumatic stress disorder)  Plan: Treatment Plan   Strengths/Abilities:  intelligent, motivated   Treatment Preferences: Outpatient Individual therapy  Statement of Needs:  I have anxiety and depression  Symptoms:  Autonomic hyperactivity (e.g., palpitations, shortness of breath, dry mouth, trouble swallowing,  nausea, diarrhea).: (Status: improved). Excessive and/or unrealistic worry that  is difficult to control occurring more days than not for at least 6 months about a number of events or  activities.: (Status: improved). Hypervigilance (e.g., feeling constantly on  edge, experiencing concentration difficulties, having trouble falling or staying asleep, exhibiting a  general state of irritability).: (Status: improved). Motor tension (e.g.,  restlessness, tiredness, shakiness, muscle tension).: (Status: improved).  Problems Addressed:  Depression and Anxiety  Goals:  LTG:1. Enhance ability to effectively cope with the full variety of life's worries  and anxieties.  60% 2. Learn and implement coping skills that result in a reduction  of anxiety  and worry, and improved daily functioning  60% 3. Reduce overall frequency, intensity, and duration of the anxiety so that  daily functioning is not impaired.  60% 4. Resolve the core conflict that is the source of anxiety.  10% 5. Stabilize anxiety level while  increasing ability to function on a daily  40% STG: 1. Identify, challenge, and replace biased, fearful self-talk with positive, realistic, and empowering self talk  60% 2. Learn and implement problem-solving strategies for realistically addressing worries.  50%   3.Learn and implement calming skills to reduce overall anxiety and manage anxiety symptoms.  50%   Target Date:  10/13/2025 Frequency:  weekly to biweekly Modality: Individual therapy Interventions by Therapist: CBT, problem solving, EMDR, insight oriented and mindfulness meditation  Patient has approved this treatment plan  Lisa Cole G Etsuko Dierolf, [name]

## 2024-08-11 ENCOUNTER — Ambulatory Visit: Admitting: Psychology

## 2024-08-19 ENCOUNTER — Ambulatory Visit: Admitting: Psychology

## 2024-08-26 ENCOUNTER — Ambulatory Visit: Admitting: Psychology

## 2024-08-26 DIAGNOSIS — F4310 Post-traumatic stress disorder, unspecified: Secondary | ICD-10-CM

## 2024-08-26 DIAGNOSIS — F411 Generalized anxiety disorder: Secondary | ICD-10-CM

## 2024-08-26 DIAGNOSIS — F331 Major depressive disorder, recurrent, moderate: Secondary | ICD-10-CM

## 2024-08-26 NOTE — Progress Notes (Signed)
 Patient cancelled because of migraine.               Lisa Cole G Marynell Bies, [name]

## 2024-09-03 ENCOUNTER — Ambulatory Visit: Admitting: Psychology

## 2024-09-04 ENCOUNTER — Ambulatory Visit: Admitting: Internal Medicine

## 2024-09-11 ENCOUNTER — Ambulatory Visit: Admitting: Psychology

## 2024-09-15 ENCOUNTER — Ambulatory Visit: Admitting: Psychology

## 2024-09-15 DIAGNOSIS — F4310 Post-traumatic stress disorder, unspecified: Secondary | ICD-10-CM | POA: Diagnosis not present

## 2024-09-15 DIAGNOSIS — F411 Generalized anxiety disorder: Secondary | ICD-10-CM | POA: Diagnosis not present

## 2024-09-15 DIAGNOSIS — F331 Major depressive disorder, recurrent, moderate: Secondary | ICD-10-CM | POA: Diagnosis not present

## 2024-09-15 NOTE — Progress Notes (Signed)
 " [facility] Counselor/Therapist Progress Notes  Patient ID: Lisa Cole, MRN: [id number],    Date: 09/15/2024  Time Spent:55 minutes  Time in: 10:05 Time out: 11:00  Treatment Type: Individual Therapy  Reported Symptoms: worry, depression, physical pain  Mental Status Exam: Appearance:  Casual  Behavior: normal  Motor: normal  Speech/Language:  normal  Affect: agitated  Mood: sad  Thought process: WNL  Thought content:   WNL  Sensory/Perceptual disturbances:   WNL  Orientation: oriented to person, place, time/date, and situation  Attention: Good  Concentration: Fair  Memory: WNL  Fund of knowledge:  Good  Insight:   Fair  Judgment:  Poor  Impulse Control: Poor   Risk Assessment: Danger to Self:  No Self-injurious Behavior: No Danger to Others: No Duty to Warn:no Physical Aggression / Violence:No  Access to Firearms a concern: No  Gang Involvement:No   Subjective: The patient attended a face-to-face individual therapy session via video visit  today.  The patient gave verbal consent for the session to be on video on caregility and is aware of the limitations of telehealth.  The patient was in her car alone and the therapist was in the office.  The patient presents as somewhat agitated today.  He also was sad and upset during the session.  The patient reports that there has been a lot going on since I saw her last.  She states that [place] and she feels that a lot of what he is doing is behavioral.  It does sound like some of it is manipulative and I think that she would do better if she would get out of the house with her parents.  I think they feed off of each other.  The patient did report that she has been approved for house and she should be able to close on January 28 or 29.  She is very happy about this and I think this is something that we have been looking forward to for quite some time.  She still says  that she wants to find her own therapist and she has looked at the ones in our office and does not want to choose to go to anyone in our office at this point in time.  I encouraged her to continue to look and told her that I would like for her to be settled in a situation before I retire.  I have offered to help her with that but she has chosen to seek help on her own.   Interventions: Cognitive Behavioral Therapy and Mindfulness Meditation, problem solving and psychoeducation.  Diagnosis:Major depressive disorder, recurrent episode, moderate (HCC)  Generalized anxiety disorder  PTSD (post-traumatic stress disorder)  Plan: Treatment Plan   Strengths/Abilities:  intelligent, motivated   Treatment Preferences: Outpatient Individual therapy  Statement of Needs:  I have anxiety and depression  Symptoms:  Autonomic hyperactivity (e.g., palpitations, shortness of breath, dry mouth, trouble swallowing,  nausea, diarrhea).: (Status: improved). Excessive and/or unrealistic worry that  is difficult to control occurring more days than not for at least 6 months about a number of events or  activities.: (Status: improved). Hypervigilance (e.g., feeling constantly on  edge, experiencing concentration difficulties, having trouble falling or staying asleep, exhibiting a  general state of irritability).: (Status: improved). Motor tension (e.g.,  restlessness, tiredness, shakiness, muscle tension).: (Status: improved).  Problems Addressed:  Depression and Anxiety  Goals:  LTG:1. Enhance ability to effectively cope with  the full variety of life's worries  and anxieties.  60% 2. Learn and implement coping skills that result in a reduction of anxiety  and worry, and improved daily functioning  60% 3. Reduce overall frequency, intensity, and duration of the anxiety so that  daily functioning is not impaired.  60% 4. Resolve the core conflict that is the source of anxiety.  10% 5. Stabilize anxiety  level while increasing ability to function on a daily  40% STG: 1. Identify, challenge, and replace biased, fearful self-talk with positive, realistic, and empowering self talk  60% 2. Learn and implement problem-solving strategies for realistically addressing worries.  50%   3.Learn and implement calming skills to reduce overall anxiety and manage anxiety symptoms.  50%   Target Date:  10/13/2025 Frequency:  weekly to biweekly Modality: Individual therapy Interventions by Therapist: CBT, problem solving, EMDR, insight oriented and mindfulness meditation  Patient has approved this treatment plan  Lisa Julius G Tylor Gambrill, [name]                                                                                                      "

## 2024-09-22 ENCOUNTER — Ambulatory Visit: Admitting: Psychology

## 2024-09-22 ENCOUNTER — Encounter: Payer: Self-pay | Admitting: Internal Medicine

## 2024-09-22 ENCOUNTER — Ambulatory Visit: Admitting: Internal Medicine

## 2024-09-22 VITALS — BP 120/70 | HR 100 | Ht 60.0 in | Wt 160.6 lb

## 2024-09-22 DIAGNOSIS — E1165 Type 2 diabetes mellitus with hyperglycemia: Secondary | ICD-10-CM | POA: Diagnosis not present

## 2024-09-22 DIAGNOSIS — Z794 Long term (current) use of insulin: Secondary | ICD-10-CM | POA: Diagnosis not present

## 2024-09-22 DIAGNOSIS — F331 Major depressive disorder, recurrent, moderate: Secondary | ICD-10-CM | POA: Diagnosis not present

## 2024-09-22 DIAGNOSIS — E063 Autoimmune thyroiditis: Secondary | ICD-10-CM

## 2024-09-22 DIAGNOSIS — F4310 Post-traumatic stress disorder, unspecified: Secondary | ICD-10-CM | POA: Diagnosis not present

## 2024-09-22 DIAGNOSIS — F411 Generalized anxiety disorder: Secondary | ICD-10-CM

## 2024-09-22 DIAGNOSIS — Z7985 Long-term (current) use of injectable non-insulin antidiabetic drugs: Secondary | ICD-10-CM | POA: Diagnosis not present

## 2024-09-22 DIAGNOSIS — E7849 Other hyperlipidemia: Secondary | ICD-10-CM | POA: Diagnosis not present

## 2024-09-22 LAB — POCT GLYCOSYLATED HEMOGLOBIN (HGB A1C): Hemoglobin A1C: 8.2 % — AB (ref 4.0–5.6)
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
## 2024-09-22 NOTE — Patient Instructions (Addendum)
 Please increase: - Toujeo  30 units daily  Continue: - Mounjaro  7.5 mg weekly   Please return in 3 months.

## 2024-09-22 NOTE — Progress Notes (Unsigned)
                edge,  experiencing concentration difficulties, having trouble falling or staying asleep, exhibiting a general  state of irritability).: No Description Entered (Status: improved). Motor tension (e.g., restlessness,  tiredness, shakiness, muscle tension).: No Description Entered (Status: improved).  Problems Addressed  Anxiety, Phase Of Life Problems, Anxiety  Goals 1. Learn and implement coping skills that result in a reduction of anxiety  and worry, and improved daily functioning. Objective Learn  and implement calming skills to reduce overall anxiety and manage anxiety symptoms. Target Date: 2025-08-09Frequency: Weekly Progress: 40 Modality: individual  Related Interventions 1. Teach the client calming/relaxation skills (e.g., applied relaxation, progressive muscle  relaxation, cue controlled relaxation; mindful breathing; biofeedback) and how to discriminate  better between relaxation and tension; teach the client how to apply these skills to his/her daily  life (e.g., New Directions in Progressive Muscle Relaxation by Marcelyn Ditty, and  Hazlett-Stevens; Treating Generalized Anxiety Disorder by Rygh and Ida Rogue). Objective Identify, challenge, and replace biased, fearful self-talk with positive, realistic, and empowering selftalk. Target Date: 2024-05-02 Frequency: weekly Progress: 30 Modality: individual Related Interventions 1. Explore the client's schema and self-talk that mediate his/her fear response; assist him/her in  challenging the biases; replace the distorted messages with reality-based alternatives and  positive, realistic self-talk that will increase his/her self-confidence in coping with irrational  fears (see Cognitive Therapy of Anxiety Disorders by Laurence Slate). Objective Learn and implement problem-solving strategies for realistically addressing worries. Target Date: 2025-08-09Frequency: weekly Progress: 40 Modality: individual 2. Resolve conflicted feelings and adapt to the new life circumstances. Objective Apply problem-solving skills to current circumstances. Target Date: 2024-05-02 Frequency: weekly Progress: 20 Modality: individual Related Interventions 1. Teach the client problem-resolution skills (e.g., defining the problem clearly, brainstorming  multiple solutions, listing the pros and cons of each solution, seeking input from others,  selecting and implementing a plan of action, evaluating outcome, and readjusting plan as   necessary).   3. Stabilize anxiety level while increasing ability to function on a daily  basis. Diagnosis F33.1  Major depressive disorder, moderate 300.02 (Generalized anxiety disorder) - Open - [Signifier: n/a]  Axis  none 309.28 (Adjustment disorder with mixed anxiety and depressed  mood) - Open - [Signifier: n/a]  Adjustment Disorder,  With Anxiety   Marital conflict  Major Depressive disorder, moderate  Conditions For Discharge Achievement of treatment goals and objectives.  The patient approved this plan.   Deonna Krummel G Ethridge Sollenberger, [name]

## 2024-10-09 ENCOUNTER — Ambulatory Visit: Admitting: Psychology

## 2024-10-09 DIAGNOSIS — F331 Major depressive disorder, recurrent, moderate: Secondary | ICD-10-CM

## 2024-10-09 DIAGNOSIS — F411 Generalized anxiety disorder: Secondary | ICD-10-CM

## 2024-10-09 DIAGNOSIS — F4310 Post-traumatic stress disorder, unspecified: Secondary | ICD-10-CM | POA: Diagnosis not present
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
## 2024-10-16 ENCOUNTER — Ambulatory Visit: Admitting: Psychology

## 2024-10-16 DIAGNOSIS — F330 Major depressive disorder, recurrent, mild: Secondary | ICD-10-CM | POA: Diagnosis not present

## 2024-10-16 DIAGNOSIS — F4310 Post-traumatic stress disorder, unspecified: Secondary | ICD-10-CM | POA: Diagnosis not present

## 2024-10-16 DIAGNOSIS — F411 Generalized anxiety disorder: Secondary | ICD-10-CM

## 2024-10-16 NOTE — Progress Notes (Signed)
 " [facility] Counselor/Therapist Progress Notes  Patient ID: Lisa Cole, MRN: [id number],    Date: 10/16/2024  Time Spent:60 minutes  Time in: 9:00 Time out: 10:00  Treatment Type: Individual Therapy  Reported Symptoms: worry, depression, physical pain  Mental Status Exam: Appearance:  Casual  Behavior: normal  Motor: normal  Speech/Language:  normal  Affect: blunted  Mood: anxious  Thought process: WNL  Thought content:   WNL  Sensory/Perceptual disturbances:   WNL  Orientation: oriented to person, place, time/date, and situation  Attention: Good  Concentration: Fair  Memory: WNL  Fund of knowledge:  Good  Insight:   Fair  Judgment:  Poor  Impulse Control: Poor   Risk Assessment: Danger to Self:  No Self-injurious Behavior: No Danger to Others: No Duty to Warn:no Physical Aggression / Violence:No  Access to Firearms a concern: No  Gang Involvement:No   Subjective: The patient attended a face-to-face individual therapy session via video visit  today.  The patient gave verbal consent for the session to be on video on caregility and is aware of the limitations of telehealth.  The patient was in her car alone and the therapist was in the office.  We ended up spending about 8 minutes on the phone before we could start the video because she was in the car and was driving to a secure spot.  We then spent some time on video and had to switch back to the phone because her phone got hot.  Total this session was 60 minutes.  The patient presents as a little anxious today.  She talked about what is going on with her right now and she does seem a little bit stressed because she still having to deal with her mother and it seems that the situation with her trying to buy the house is on hold right now  again.  It is still likely to happen but it may take another month or 2.  She does seem a little better than she was the last time I saw her and I continue to encourage her  to do her mindfulness and meditation on a regular basis so that she does not allow herself to be so reactive.  We get a little bit of problem solving with the situation with her house and I do believe that it will help her when she gets herself to a more stable environment.  We talked a bit more about whether she has found somebody to go to after I stop seeing her and she has intentionally said that she wants to find her own therapist and vet them.  I did give her the name of another practice in town and told her that she could contact them and see what she thought about possibly going to see somebody in that practice.  Once I get a release I we will share information with whoever she plans to see but at this point we do not have a release because we are not sure which direction she is going to go.   Interventions: Cognitive Behavioral Therapy and Mindfulness Meditation, problem solving and psychoeducation.  Diagnosis:PTSD (post-traumatic stress disorder)  Generalized anxiety disorder  Major depressive disorder, recurrent episode, mild  Plan: Treatment Plan   Strengths/Abilities:  intelligent, motivated   Treatment Preferences: Outpatient Individual therapy  Statement of Needs:  I have anxiety and depression  Symptoms:  Autonomic hyperactivity (e.g., palpitations, shortness of breath, dry mouth, trouble swallowing,  nausea, diarrhea).: (Status: improved). Excessive and/or  unrealistic worry that  is difficult to control occurring more days than not for at least 6 months about a number of events or  activities.: (Status: improved). Hypervigilance (e.g., feeling constantly on  edge, experiencing concentration difficulties, having trouble falling or staying asleep, exhibiting a  general state of irritability).: (Status: improved). Motor tension (e.g.,  restlessness, tiredness, shakiness, muscle tension).: (Status: improved).  Problems Addressed:  Depression and Anxiety  Goals:  LTG:1.  Enhance ability to effectively cope with the full variety of life's worries  and anxieties.  60% 2. Learn and implement coping skills that result in a reduction of anxiety  and worry, and improved daily functioning  60% 3. Reduce overall frequency, intensity, and duration of the anxiety so that  daily functioning is not impaired.  60% 4. Resolve the core conflict that is the source of anxiety.  10% 5. Stabilize anxiety level while increasing ability to function on a daily  40% STG: 1. Identify, challenge, and replace biased, fearful self-talk with positive, realistic, and empowering self talk  60% 2. Learn and implement problem-solving strategies for realistically addressing worries.  50%   3.Learn and implement calming skills to reduce overall anxiety and manage anxiety symptoms.  50%   Target Date:  10/13/2025 Frequency:  weekly to biweekly Modality: Individual therapy Interventions by Therapist: CBT, problem solving, EMDR, insight oriented and mindfulness meditation  Patient has approved this treatment plan  Lisa Berndt G [name], [name]                                                                                                      "

## 2024-10-23 ENCOUNTER — Ambulatory Visit: Admitting: Psychology

## 2024-10-30 ENCOUNTER — Ambulatory Visit: Admitting: Psychology

## 2024-10-30 DIAGNOSIS — F4310 Post-traumatic stress disorder, unspecified: Secondary | ICD-10-CM

## 2024-10-30 DIAGNOSIS — F330 Major depressive disorder, recurrent, mild: Secondary | ICD-10-CM

## 2024-10-30 DIAGNOSIS — F411 Generalized anxiety disorder: Secondary | ICD-10-CM

## 2024-10-30 NOTE — Progress Notes (Signed)
 " [facility] Counselor/Therapist Progress Notes  Patient ID: Lisa Cole, MRN: [id number],    Date: 10/30/2024  Time Spent:45 minutes  Time in: 3:15 Time out: 4:00  Treatment Type: Individual Therapy  Reported Symptoms: worry, depression, physical pain  Mental Status Exam: Appearance:  Casual  Behavior: normal  Motor: normal  Speech/Language:  normal  Affect: blunted  Mood: anxious  Thought process: WNL  Thought content:   WNL  Sensory/Perceptual disturbances:   WNL  Orientation: oriented to person, place, time/date, and situation  Attention: Good  Concentration: Fair  Memory: WNL  Fund of knowledge:  Good  Insight:   Fair  Judgment:  Poor  Impulse Control: Poor   Risk Assessment: Danger to Self:  No Self-injurious Behavior: No Danger to Others: No Duty to Warn:no Physical Aggression / Violence:No  Access to Firearms a concern: No  Gang Involvement:No   Subjective: The patient attended a face-to-face individual therapy session via video visit  today.  The patient gave verbal consent for the session to be on video on caregility and is aware of the limitations of telehealth.  The patient was in her car alone and the therapist was in the office.  We ended up doing the entire session on the phone because she was having some difficulties getting online and she wanted to just talk on the phone today.  The patient reports that she has her closing papers and she should be able to close next week on the house that she is interested in.  We talked about what the patient is doing to help herself feel better.  She does seem to be a little more stable than she has been in the past.  I encouraged her to give herself positive messages and not think that everything is going to be bad.  Used cognitive behavioral therapy during the session today  Interventions: Cognitive Behavioral Therapy and Mindfulness Meditation, problem solving and psychoeducation.  Diagnosis:PTSD  (post-traumatic stress disorder)  Generalized anxiety disorder  Major depressive disorder, recurrent episode, mild  Plan: Treatment Plan   Strengths/Abilities:  intelligent, motivated   Treatment Preferences: Outpatient Individual therapy  Statement of Needs:  I have anxiety and depression  Symptoms:  Autonomic hyperactivity (e.g., palpitations, shortness of breath, dry mouth, trouble swallowing,  nausea, diarrhea).: (Status: improved). Excessive and/or unrealistic worry that  is difficult to control occurring more days than not for at least 6 months about a number of events or  activities.: (Status: improved). Hypervigilance (e.g., feeling constantly on  edge, experiencing concentration difficulties, having trouble falling or staying asleep, exhibiting a  general state of irritability).: (Status: improved). Motor tension (e.g.,  restlessness, tiredness, shakiness, muscle tension).: (Status: improved).  Problems Addressed:  Depression and Anxiety  Goals:  LTG:1. Enhance ability to effectively cope with the full variety of life's worries  and anxieties.  60% 2. Learn and implement coping skills that result in a reduction of anxiety  and worry, and improved daily functioning  60% 3. Reduce overall frequency, intensity, and duration of the anxiety so that  daily functioning is not impaired.  70% 4. Resolve the core conflict that is the source of anxiety.  10% 5. Stabilize anxiety level while increasing ability to function on a daily  40% STG: 1. Identify, challenge, and replace biased, fearful self-talk with positive, realistic, and empowering self talk  60% 2. Learn and implement problem-solving strategies for realistically addressing worries.  50%   3.Learn and implement calming skills to reduce overall anxiety  and manage anxiety symptoms.  50%   Target Date:  10/13/2025 Frequency:  weekly to biweekly Modality: Individual therapy Interventions by Therapist: CBT, problem  solving, EMDR, insight oriented and mindfulness meditation  Patient has approved this treatment plan  Lisa Goerner G Nivek Powley, [name]                                                                                                      "

## 2025-01-01 ENCOUNTER — Ambulatory Visit: Admitting: Internal Medicine
